# Patient Record
Sex: Female | Born: 1965 | Race: Black or African American | Hispanic: No | Marital: Married | Smoking: Never smoker
Health system: Southern US, Community
[De-identification: ages and names within clinical notes are randomized; demographics above are authoritative.]

## PROBLEM LIST (undated history)

## (undated) DIAGNOSIS — E039 Hypothyroidism, unspecified: Secondary | ICD-10-CM

## (undated) DIAGNOSIS — K649 Unspecified hemorrhoids: Secondary | ICD-10-CM

## (undated) DIAGNOSIS — M1990 Unspecified osteoarthritis, unspecified site: Secondary | ICD-10-CM

## (undated) DIAGNOSIS — K219 Gastro-esophageal reflux disease without esophagitis: Secondary | ICD-10-CM

## (undated) HISTORY — DX: Hypothyroidism, unspecified: E03.9

## (undated) HISTORY — PX: TUBAL LIGATION: SHX77

## (undated) HISTORY — PX: APPENDECTOMY: SHX54

## (undated) HISTORY — DX: Unspecified hemorrhoids: K64.9

## (undated) HISTORY — PX: BREAST REDUCTION SURGERY: SHX8

## (undated) HISTORY — DX: Gastro-esophageal reflux disease without esophagitis: K21.9

---
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
## 1994-10-01 HISTORY — PX: BREAST REDUCTION SURGERY: SHX8

## 1996-10-01 HISTORY — PX: TUBAL LIGATION: SHX77

## 1998-01-09 ENCOUNTER — Ambulatory Visit (HOSPITAL_COMMUNITY): Admission: RE | Admit: 1998-01-09 | Discharge: 1998-01-09 | Payer: Self-pay | Admitting: Family Medicine

## 1998-10-18 ENCOUNTER — Encounter: Payer: Self-pay | Admitting: Emergency Medicine

## 1998-10-18 ENCOUNTER — Emergency Department (HOSPITAL_COMMUNITY): Admission: EM | Admit: 1998-10-18 | Discharge: 1998-10-18 | Payer: Self-pay | Admitting: Emergency Medicine

## 1999-02-15 ENCOUNTER — Ambulatory Visit (HOSPITAL_COMMUNITY): Admission: RE | Admit: 1999-02-15 | Discharge: 1999-02-15 | Payer: Self-pay | Admitting: Family Medicine

## 1999-02-16 ENCOUNTER — Encounter: Payer: Self-pay | Admitting: Family Medicine

## 1999-03-10 ENCOUNTER — Ambulatory Visit (HOSPITAL_COMMUNITY): Admission: RE | Admit: 1999-03-10 | Discharge: 1999-03-10 | Payer: Self-pay | Admitting: Family Medicine

## 1999-03-10 ENCOUNTER — Encounter: Payer: Self-pay | Admitting: Family Medicine

## 1999-06-22 ENCOUNTER — Inpatient Hospital Stay (HOSPITAL_COMMUNITY): Admission: RE | Admit: 1999-06-22 | Discharge: 1999-06-23 | Payer: Self-pay | Admitting: General Surgery

## 1999-06-25 ENCOUNTER — Emergency Department (HOSPITAL_COMMUNITY): Admission: EM | Admit: 1999-06-25 | Discharge: 1999-06-25 | Payer: Self-pay | Admitting: Emergency Medicine

## 1999-09-15 ENCOUNTER — Emergency Department (HOSPITAL_COMMUNITY): Admission: EM | Admit: 1999-09-15 | Discharge: 1999-09-15 | Payer: Self-pay | Admitting: Emergency Medicine

## 1999-09-15 ENCOUNTER — Encounter: Payer: Self-pay | Admitting: Emergency Medicine

## 2001-03-21 ENCOUNTER — Encounter: Payer: Self-pay | Admitting: Family Medicine

## 2001-03-21 ENCOUNTER — Encounter: Admission: RE | Admit: 2001-03-21 | Discharge: 2001-03-21 | Payer: Self-pay | Admitting: Family Medicine

## 2001-04-13 ENCOUNTER — Emergency Department (HOSPITAL_COMMUNITY): Admission: EM | Admit: 2001-04-13 | Discharge: 2001-04-13 | Payer: Self-pay | Admitting: Emergency Medicine

## 2001-04-13 ENCOUNTER — Encounter: Payer: Self-pay | Admitting: Emergency Medicine

## 2001-04-28 ENCOUNTER — Other Ambulatory Visit: Admission: RE | Admit: 2001-04-28 | Discharge: 2001-04-28 | Payer: Self-pay | Admitting: Family Medicine

## 2005-02-06 ENCOUNTER — Other Ambulatory Visit: Admission: RE | Admit: 2005-02-06 | Discharge: 2005-02-06 | Payer: Self-pay | Admitting: Family Medicine

## 2005-10-30 ENCOUNTER — Encounter: Admission: RE | Admit: 2005-10-30 | Discharge: 2005-10-30 | Payer: Self-pay | Admitting: Family Medicine

## 2006-02-07 ENCOUNTER — Other Ambulatory Visit: Admission: RE | Admit: 2006-02-07 | Discharge: 2006-02-07 | Payer: Self-pay | Admitting: Family Medicine

## 2007-02-21 ENCOUNTER — Ambulatory Visit: Payer: Self-pay | Admitting: Family Medicine

## 2007-02-21 LAB — CONVERTED CEMR LAB
Free T4: 1.1 ng/dL (ref 0.6–1.6)
T3, Free: 4.4 pg/mL — ABNORMAL HIGH (ref 2.3–4.2)
TSH: 0.03 microintl units/mL — ABNORMAL LOW (ref 0.35–5.50)

## 2007-05-02 ENCOUNTER — Ambulatory Visit: Payer: Self-pay | Admitting: Family Medicine

## 2007-05-02 DIAGNOSIS — E039 Hypothyroidism, unspecified: Secondary | ICD-10-CM | POA: Insufficient documentation

## 2007-05-02 LAB — CONVERTED CEMR LAB: TSH: 0.891 microintl units/mL (ref 0.350–5.50)

## 2007-06-03 ENCOUNTER — Ambulatory Visit: Payer: Self-pay | Admitting: Family Medicine

## 2010-01-14 ENCOUNTER — Emergency Department (HOSPITAL_COMMUNITY): Admission: EM | Admit: 2010-01-14 | Discharge: 2010-01-14 | Payer: Self-pay | Admitting: Emergency Medicine

## 2010-07-17 ENCOUNTER — Encounter (INDEPENDENT_AMBULATORY_CARE_PROVIDER_SITE_OTHER): Payer: Self-pay | Admitting: *Deleted

## 2010-08-07 ENCOUNTER — Ambulatory Visit: Payer: Self-pay | Admitting: Gastroenterology

## 2010-08-07 DIAGNOSIS — R6889 Other general symptoms and signs: Secondary | ICD-10-CM | POA: Insufficient documentation

## 2010-08-07 DIAGNOSIS — K219 Gastro-esophageal reflux disease without esophagitis: Secondary | ICD-10-CM | POA: Insufficient documentation

## 2010-08-07 DIAGNOSIS — R1310 Dysphagia, unspecified: Secondary | ICD-10-CM | POA: Insufficient documentation

## 2010-08-07 LAB — CONVERTED CEMR LAB
ALT: 11 units/L (ref 0–35)
AST: 21 units/L (ref 0–37)
Albumin: 3.9 g/dL (ref 3.5–5.2)
Alkaline Phosphatase: 50 units/L (ref 39–117)
BUN: 6 mg/dL (ref 6–23)
Basophils Absolute: 0 10*3/uL (ref 0.0–0.1)
Basophils Relative: 0.1 % (ref 0.0–3.0)
CO2: 28 meq/L (ref 19–32)
Calcium: 8.6 mg/dL (ref 8.4–10.5)
Chloride: 105 meq/L (ref 96–112)
Creatinine, Ser: 0.9 mg/dL (ref 0.4–1.2)
Eosinophils Absolute: 0 10*3/uL (ref 0.0–0.7)
Eosinophils Relative: 0.8 % (ref 0.0–5.0)
GFR calc non Af Amer: 82.63 mL/min (ref >60)
Glucose, Bld: 126 mg/dL — ABNORMAL HIGH (ref 70–99)
HCT: 38.7 % (ref 36.0–46.0)
Hemoglobin: 12.8 g/dL (ref 12.0–15.0)
Lymphocytes Relative: 35.6 % (ref 12.0–46.0)
Lymphs Abs: 2.2 10*3/uL (ref 0.7–4.0)
MCHC: 33 g/dL (ref 30.0–36.0)
MCV: 74.1 fL — ABNORMAL LOW (ref 78.0–100.0)
Monocytes Absolute: 0.3 10*3/uL (ref 0.1–1.0)
Monocytes Relative: 5.6 % (ref 3.0–12.0)
Neutro Abs: 3.5 10*3/uL (ref 1.4–7.7)
Neutrophils Relative %: 57.9 % (ref 43.0–77.0)
Platelets: 239 10*3/uL (ref 150.0–400.0)
Potassium: 4.1 meq/L (ref 3.5–5.1)
RBC: 5.22 M/uL — ABNORMAL HIGH (ref 3.87–5.11)
RDW: 18.3 % — ABNORMAL HIGH (ref 11.5–14.6)
Sodium: 142 meq/L (ref 135–145)
Total Bilirubin: 0.9 mg/dL (ref 0.3–1.2)
Total Protein: 6.6 g/dL (ref 6.0–8.3)
WBC: 6.1 10*3/uL (ref 4.5–10.5)

## 2010-09-07 ENCOUNTER — Ambulatory Visit: Payer: Self-pay | Admitting: Gastroenterology

## 2010-10-05 ENCOUNTER — Encounter (INDEPENDENT_AMBULATORY_CARE_PROVIDER_SITE_OTHER): Payer: Self-pay | Admitting: *Deleted

## 2010-10-22 ENCOUNTER — Encounter: Payer: Self-pay | Admitting: Family Medicine

## 2010-11-02 NOTE — Procedures (Signed)
Summary: Upper Endoscopy  Patient: Shelby Espinoza Below Note: All result statuses are Final unless otherwise noted.  Tests: (1) Upper Endoscopy (EGD)   EGD Upper Endoscopy       DONE     [facility]     [address]     [place], [place]  [address]           ENDOSCOPY PROCEDURE REPORT           PATIENT:  Shelby Espinoza, Shelby Espinoza  MR#:  [id number]     BIRTHDATE:  1966/01/04, 44 yrs. old  GENDER:  female           ENDOSCOPIST:  Barbette Hair. Arlyce Dice, MD     Referred by:           PROCEDURE DATE:  09/07/2010     PROCEDURE:  EGD, diagnostic 43235, Maloney Dilation of Esophagus     ASA CLASS:  Class I     INDICATIONS:  dysphagia           MEDICATIONS:   Fentanyl 50 mcg IV, Versed 8 mg IV, glycopyrrolate     (Robinal) 0.2 mg IV, 0.6cc simethancone 0.6 cc PO     TOPICAL ANESTHETIC:  Exactacain Spray           DESCRIPTION OF PROCEDURE:   After the risks benefits and     alternatives of the procedure were thoroughly explained, informed     consent was obtained.  The LB GIF-H180 [id number] endoscope was     introduced through the mouth and advanced to the third portion of     the duodenum, without limitations.  The instrument was slowly     withdrawn as the mucosa was fully examined.     <<PROCEDUREIMAGES>>           The upper, middle, and distal third of the esophagus were     carefully inspected and no abnormalities were noted. The z-line     was well seen at the GEJ. The endoscope was pushed into the fundus     which was normal including a retroflexed view. The antrum,gastric     body, first and second part of the duodenum were unremarkable.     Dilation with maloney dilator 18mm Dysphagis without frank     stricture (see image1 and image2). Minimal resistance; no heme     Retroflexed views revealed no abnormalities.    The scope was then     withdrawn from the patient and the procedure completed.           COMPLICATIONS:  None           ENDOSCOPIC IMPRESSION:     1) Normal EGD - s/p maloney  dilitation     RECOMMENDATIONS:     1) continue current medications     2) Call office next 2-3 days to schedule an office appointment     for 3-4 weeks           REPEAT EXAM:  No           ______________________________     Barbette Hair. Arlyce Dice, MD           CC:  Herb Grays, MD           n.     Rosalie DoctorBarbette Hair. Kaplan at 09/07/2010 02:38 PM           Randa Spike, [phone]  Note: An exclamation mark (!) indicates a result that was  not dispersed into the flowsheet. Document Creation Date: 09/07/2010 2:39 PM _______________________________________________________________________  (1) Order result status: Final Collection or observation date-time: 09/07/2010 14:33 Requested date-time:  Receipt date-time:  Reported date-time:  Referring Physician:   Ordering Physician: Melvia Heaps [phone]) Specimen Source:  Source: Launa Grill Order Number: [id number] Lab site:

## 2010-11-02 NOTE — Assessment & Plan Note (Signed)
Summary: CONSTIPATION AND BAD BREATH...AS.   History of Present Illness Visit Type: Follow-up Visit Primary GI MD: Melvia Heaps MD [facility] Primary Leith Szafranski: Earma Reading, MD  Requesting Dustyn Dansereau: na Chief Complaint: Constipation, bloating, GERD, bad breath, and food gets stuck when patient swallows  History of Present Illness:   Shelby Espinoza is a 45 year old Afro-American female referred at the request of Dr. Collins Scotland for evaluation of dysphagia and halitosis.  She has dysphagia is to solids and may actually feel like she is choking at times.  She denies pyrosis.  She also claims that she developes halitosis after eating almost anything.  She complains of mild bloating and constipation for years.  These symptoms are nonspecific.   Any foods may cause symptoms  although she claims that she is lactose intolerant as well.  PPI therapy in the past has not been helpful.   GI Review of Systems    Reports acid reflux, bloating, and  dysphagia with solids.      Denies abdominal pain, belching, chest pain, dysphagia with liquids, loss of appetite, nausea, vomiting, vomiting blood, weight loss, and  weight gain.      Reports constipation.     Denies anal fissure, black tarry stools, change in bowel habit, diarrhea, diverticulosis, fecal incontinence, heme positive stool, hemorrhoids, irritable bowel syndrome, jaundice, light color stool, liver problems, rectal bleeding, and  rectal pain.    Current Medications (verified): 1)  Cytomel 25 Mcg  Tabs (Liothyronine Sodium) .... 1/2  Tab By Mouth in The Am  Allergies (verified): No Known Drug Allergies  Past History:  Past Medical History: GERD (ICD-530.81) HYPOTHYROIDISM (ICD-244.9)  Past Surgical History: Reviewed history from 06/03/2007 and no changes required. CB x1 Appendectomy Caesarean section Thyroidectomy  Family History: Family History of  AIDS Family History of Alcoholism/Addiction Family History of CAD Female 1st degree relative  <50 Family History Diabetes 1st degree relative Family History High cholesterol Family History Hypertension No FH of Colon Cancer: Family History of Breast Cancer:Mother  Family History of Esophageal Cancer:Father   Social History: Radiation protection practitioner Married 3 Childern Never Smoked Alcohol use-no Drug use-no  Review of Systems       The patient complains of fatigue.  The patient denies allergy/sinus, anemia, anxiety-new, arthritis/joint pain, back pain, blood in urine, breast changes/lumps, change in vision, confusion, cough, coughing up blood, depression-new, fainting, fever, headaches-new, hearing problems, heart murmur, heart rhythm changes, itching, menstrual pain, muscle pains/cramps, night sweats, nosebleeds, pregnancy symptoms, shortness of breath, skin rash, sleeping problems, sore throat, swelling of feet/legs, swollen lymph glands, thirst - excessive , urination - excessive , urination changes/pain, urine leakage, vision changes, and voice change.         All other systems were reviewed and were negative   Vital Signs:  Patient profile:   45 year old female Height:      63 inches Weight:      174 pounds BMI:     30.93 BSA:     1.82 Pulse rate:   78 / minute Pulse rhythm:   regular BP sitting:   136 / 80  (left arm) Cuff size:   regular  Vitals Entered By: Ok Anis CMA (August 07, 2010 3:29 PM)  Physical Exam  Additional Exam:  O physical sent exam she is well-developed alert female  skin: anicteric HEENT: normocephalic; PEERLA; no nasal or pharyngeal abnormalities neck: supple nodes: no cervical lymphadenopathy chest: clear to ausculatation and percussion heart: no murmurs, gallops, or rubs abd: soft,  nontender; BS normoactive; no abdominal masses, tenderness, organomegaly; there is no succussion splash rectal: deferred ext: no cynanosis, clubbing, edema skeletal: no deformities neuro: oriented x 3; no focal abnormalities    Impression  & Recommendations:  Problem # 1:  DYSPHAGIA UNSPECIFIED (ICD-787.20) Plan upper endoscopy to rule out an esophageal stricture.  With her complaints of halitosis a Zenker's diverticulum must also be considered. Orders: EGD (EGD) TLB-CBC Platelet - w/Differential (85025-CBCD) TLB-CMP (Comprehensive Metabolic Pnl) (80053-COMP)  Problem # 2:  OTHER SYMPTOMS INVOLVING HEAD AND NECK (ICD-784.99) Halitosis is a very subjective complaint.  This could be result of mild gastroparesis and mild indigestion or maldigestion.  Mild hypomotility may also be contributing.  Recommendations #1 trial of Amitiza 8 micrograms b.i.d.  Problem # 3:  GERD (ICD-530.81) The absence of response to PPI therapy against GERD  Patient Instructions: 1)  Copy sent to : Earma Reading, MD  2)  Go to basement today for lab work 3)  You have been scheduled for a Upper Endoscopy with possible Dilatation. 4)  Upper Endoscopy with Dilatation brochure given.  5)  Amitiza samples given  6)  The medication list was reviewed and reconciled.  All changed / newly prescribed medications were explained.  A complete medication list was provided to the patient / caregiver.

## 2010-11-02 NOTE — Assessment & Plan Note (Signed)
  Nurse Visit   Preventive Screening-Counseling & Management  Comments: I spoke with Shelby Espinoza on 10/05/2010 regarding the [employer] constipation study. A consent form had been given to her at the time of her procedure. After reading the consent form Shelby Espinoza told me that she was not interested in the study and that while she has occasional constipation, that it is not consistant and probably would not qualify for the study.   Allergies: No Known Drug Allergies

## 2010-11-02 NOTE — Letter (Signed)
Summary: New Patient letter  [facility]  [address] [place], [place] [address]   Phone: [phone]  Fax: [phone]       07/17/2010 MRN: [id number]  [facility] [address] [place], [place]  [address]  Dear Ms. Shelby Espinoza,  Welcome to the Gastroenterology Division at [facility].    You are scheduled to see Dr. Arlyce Dice on 08/07/2010 at 3:30PM on the 3rd floor at [facility], [address]  We ask that you try to arrive at our office 15 minutes prior to your appointment time to allow for check-in.  We would like you to complete the enclosed self-administered evaluation form prior to your visit and bring it with you on the day of your appointment.  We will review it with you.  Also, please bring a complete list of all your medications or, if you prefer, bring the medication bottles and we will list them.  Please bring your insurance card so that we may make a copy of it.  If your insurance requires a referral to see a specialist, please bring your referral form from your primary care physician.  Co-payments are due at the time of your visit and may be paid by cash, check or credit card.     Your office visit will consist of a consult with your physician (includes a physical exam), any laboratory testing he/she may order, scheduling of any necessary diagnostic testing (e.g. x-ray, ultrasound, CT-scan), and scheduling of a procedure (e.g. Endoscopy, Colonoscopy) if required.  Please allow enough time on your schedule to allow for any/all of these possibilities.    If you cannot keep your appointment, please call [phone] to cancel or reschedule prior to your appointment date.  This allows [place] the opportunity to schedule an appointment for another patient in need of care.  If you do not cancel or reschedule by 5 p.m. the business day prior to your appointment date, you will be charged a $50.00 late cancellation/no-show fee.    Thank you for choosing  [facility] for your medical needs.  We appreciate the opportunity to care for you.  Please visit [place] at our website  to learn more about our practice.                     Sincerely,                                                             The Gastroenterology Division

## 2010-11-02 NOTE — Letter (Signed)
Summary: EGD Instructions  [facility]  [address] [place], [place] [address]   Phone: [phone]  Fax: [phone]       Shelby Espinoza    June 02, 1966    MRN: [id number]       Procedure Day /Date:Thursday September 07, 2010     Arrival Time: 1:00 PM     Procedure Time: 2:00 PM     Location of Procedure:                    [facility] (4th Floor)   PREPARATION FOR ENDOSCOPY   On Thursday 09-07-10 THE DAY OF THE PROCEDURE:  1.   No solid foods, milk or milk products are allowed after midnight the night before your procedure.  2.   Do not drink anything colored red or purple.  Avoid juices with pulp.  No orange juice.  3.  You may drink clear liquids until 12pm which is 2 hours before your procedure.                                                                                                CLEAR LIQUIDS INCLUDE: Water Jello Ice Popsicles Tea (sugar ok, no milk/cream) Powdered fruit flavored drinks Coffee (sugar ok, no milk/cream) Gatorade Juice: apple, white grape, white cranberry  Lemonade Clear bullion, consomm, broth Carbonated beverages (any kind) Strained chicken noodle soup Hard Candy   MEDICATION INSTRUCTIONS  Unless otherwise instructed, you should take regular prescription medications with a small sip of water as early as possible the morning of your procedure.         OTHER INSTRUCTIONS  You will need a responsible adult at least 46 years of age to accompany you and drive you home.   This person must remain in the waiting room during your procedure.  Wear loose fitting clothing that is easily removed.  Leave jewelry and other valuables at home.  However, you may wish to bring a book to read or an iPod/MP3 player to listen to music as you wait for your procedure to start.  Remove all body piercing jewelry and leave at home.  Total time from sign-in until discharge is approximately 2-3 hours.  You should go home  directly after your procedure and rest.  You can resume normal activities the day after your procedure.  The day of your procedure you should not:   Drive   Make legal decisions   Operate machinery   Drink alcohol   Return to work  You will receive specific instructions about eating, activities and medications before you leave.    The above instructions have been reviewed and explained to me by   _______________________    I fully understand and can verbalize these instructions _____________________________ Date _________

## 2010-11-02 NOTE — Assessment & Plan Note (Signed)
Summary: NEED THYROID CHECKED   Vital Signs:  Patient Profile:   45 Years Old Female Weight:      168 pounds (76.36 kg) Temp:     98.1 degrees F (36.72 degrees C) oral BP sitting:   119 / 80  (right arm)  Vitals Entered By: Arcola Jansky, RN (June 03, 2007 4:10 PM)                 Chief Complaint:  "THINK THAT THE NEW RX OF THYROID MED HIGH ENOUGH".  History of Present Illness: Miss Scaletta comes in today accompanied by her husband for evaluation of hypothyroidism.  She was being in the spring for a physical right ration at that time.  Her TSH was low at .03.  His Synthroid was decreased to hundred micrograms a day to 75.  She had a follow-up TSH, and it was normal.  However, she says she feels better and wants to go back on the under microgram dose.  Review of systems is negative  Acute Visit History:      She denies abdominal pain, chest pain, constipation, cough, diarrhea, earache, eye symptoms, fever, genitourinary symptoms, headache, musculoskeletal symptoms, nasal discharge, nausea, rash, sinus problems, sore throat, and vomiting.         Current Allergies: No known allergies   Past Medical History:    Reviewed history from 06/03/2007 and no changes required:       Hypothyroidism     Review of Systems      See HPI   Physical Exam  General:     Well-developed,well-nourished,in no acute distress; alert,appropriate and cooperative throughout examination Neck:     No deformities, masses, or tenderness noted.    Impression & Recommendations:  Problem # 1:  HYPOTHYROIDISM (ICD-244.9) Assessment: Deteriorated   Patient Instructions: 1)  had a long discussion with the patient and her husband about hypothyroidism.  We discussed the necessity of keeping her TSH within normal range.  However, she feels bad, and was to go back on her Synthroid under micrograms a day.  Therefore, rewrite her medicine as directed.  Come back in May for annual checkup

## 2010-11-02 NOTE — Letter (Signed)
Summary: Results Letter  [facility]  [address] [place], [place] [address]   Phone: [phone]  Fax: [phone]        August 07, 2010 MRN: [id number]    [facility] [address] [place], [place]  [address]    Dear Ms. DECOSTE,  It is my pleasure to have treated you recently as a new patient in my office. I appreciate your confidence and the opportunity to participate in your care.  Since I do have a busy inpatient endoscopy schedule and office schedule, my office hours vary weekly. I am, however, available for emergency calls everyday through my office. If I am not available for an urgent office appointment, another one of our gastroenterologist will be able to assist you.  My well-trained staff are prepared to help you at all times. For emergencies after office hours, a physician from our Gastroenterology section is always available through my 24 hour answering service  Once again I welcome you as a new patient and I look forward to a happy and healthy relationship             Sincerely,  Louis Meckel MD  This letter has been electronically signed by your physician.  Appended Document: Results Letter letter mailed

## 2011-02-16 NOTE — Assessment & Plan Note (Signed)
[facility]                                 ON-CALL NOTE   TENNILE, STYLES                         MRN:          [id number]  DATE:05/10/2007                            DOB:          1966-03-21    PRIMARY CARE PHYSICIAN:  Dr. Tawanna Cooler   The patient called in wanting to know the results of her labs. She was  advised to call her primary care physician on Monday in order to get the  laboratory information she is seeking.     Leanne Chang, M.D.  Electronically Signed    LA/MedQ  DD: 05/10/2007  DT: 05/11/2007  Job #: [id number]

## 2011-05-29 ENCOUNTER — Telehealth: Payer: Self-pay | Admitting: Gastroenterology

## 2011-05-29 NOTE — Telephone Encounter (Signed)
Never heard of it.  Please ask pt more info about this

## 2011-05-29 NOTE — Telephone Encounter (Signed)
Pt c/o a foul breath odor. She saw a program on TV that talked about a urine test for TMAU. Pt asked her PCP and she was told this would have to be done by her GI doc. Is this a test that you are familiar with or could be done here? Please advise.

## 2011-05-30 NOTE — Telephone Encounter (Signed)
Pt aware.

## 2011-06-06 ENCOUNTER — Telehealth: Payer: Self-pay

## 2011-06-06 NOTE — Telephone Encounter (Signed)
ok ----- Message ----- From: Lily Lovings, RN Sent: 06/06/2011 10:59 AM To: Louis Meckel, MD  Pt is interested in pursuing this test. [facility] and left message for them to call back regarding the Urine TMAU test.   Phone number [phone]   ----- Message ----- From: Louis Meckel, MD Sent: 06/01/2011 10:35 AM To: Lily Lovings, RN  Ask the pt if she wishes [place] to pursue this ----- Message ----- From: Lily Lovings, RN Sent: 06/01/2011 8:30 AM To: Louis Meckel, MD  Spoke with our lab and [facility] regarding the test. According to [facility] the [facility] clinic doesn't do this test anymore. It would have to be performed by either [place] [facility] or [facility]. If we want to look into this more those companies would have to be contacted to see exactly how the specimen was to be sent and the billing. ----- Message ----- From: Louis Meckel, MD Sent: 05/31/2011 4:43 PM To: Lily Lovings, RN  Please check with the lab and see if they do a urine test for TMAU testing. You can tell them that the female clinic we'll perform this test called [id number].         Attached Reports     The sender attached the following reports to this message:        Left message for pt that the test would be billed to the ordering provider and we cannot do that. The company states they do not bill the pt or the insurance company. Let pt know I could get her the phone number if she wanted to pursue this on her own. Dr. Arlyce Dice aware.

## 2011-08-01 ENCOUNTER — Other Ambulatory Visit: Payer: Self-pay | Admitting: Obstetrics and Gynecology

## 2012-08-13 ENCOUNTER — Other Ambulatory Visit: Payer: Self-pay | Admitting: Family Medicine

## 2012-08-13 DIAGNOSIS — N6325 Unspecified lump in the left breast, overlapping quadrants: Secondary | ICD-10-CM

## 2012-08-20 ENCOUNTER — Ambulatory Visit
Admission: RE | Admit: 2012-08-20 | Discharge: 2012-08-20 | Disposition: A | Discharge status: Home / Self Care | Payer: BC Managed Care – PPO | Source: Ambulatory Visit | Attending: Family Medicine | Admitting: Family Medicine

## 2012-08-20 ENCOUNTER — Other Ambulatory Visit: Payer: Self-pay | Admitting: Family Medicine

## 2012-08-20 DIAGNOSIS — N6315 Unspecified lump in the right breast, overlapping quadrants: Secondary | ICD-10-CM

## 2012-08-20 DIAGNOSIS — N6325 Unspecified lump in the left breast, overlapping quadrants: Secondary | ICD-10-CM

## 2013-04-08 ENCOUNTER — Emergency Department (HOSPITAL_COMMUNITY): Payer: BC Managed Care – PPO

## 2013-04-08 ENCOUNTER — Emergency Department (HOSPITAL_COMMUNITY)
Admission: EM | Admit: 2013-04-08 | Discharge: 2013-04-08 | Disposition: A | Discharge status: Home / Self Care | Payer: BC Managed Care – PPO | Attending: Emergency Medicine | Admitting: Emergency Medicine

## 2013-04-08 ENCOUNTER — Encounter (HOSPITAL_COMMUNITY): Payer: Self-pay

## 2013-04-08 DIAGNOSIS — R110 Nausea: Secondary | ICD-10-CM | POA: Insufficient documentation

## 2013-04-08 DIAGNOSIS — R109 Unspecified abdominal pain: Secondary | ICD-10-CM

## 2013-04-08 DIAGNOSIS — Z9851 Tubal ligation status: Secondary | ICD-10-CM | POA: Insufficient documentation

## 2013-04-08 DIAGNOSIS — R42 Dizziness and giddiness: Secondary | ICD-10-CM | POA: Insufficient documentation

## 2013-04-08 DIAGNOSIS — Z9889 Other specified postprocedural states: Secondary | ICD-10-CM | POA: Insufficient documentation

## 2013-04-08 DIAGNOSIS — R5383 Other fatigue: Secondary | ICD-10-CM | POA: Insufficient documentation

## 2013-04-08 DIAGNOSIS — Z79899 Other long term (current) drug therapy: Secondary | ICD-10-CM | POA: Insufficient documentation

## 2013-04-08 DIAGNOSIS — R5381 Other malaise: Secondary | ICD-10-CM | POA: Insufficient documentation

## 2013-04-08 DIAGNOSIS — Z9089 Acquired absence of other organs: Secondary | ICD-10-CM | POA: Insufficient documentation

## 2013-04-08 DIAGNOSIS — Z3202 Encounter for pregnancy test, result negative: Secondary | ICD-10-CM | POA: Insufficient documentation

## 2013-04-08 LAB — URINALYSIS, ROUTINE W REFLEX MICROSCOPIC
Bilirubin Urine: NEGATIVE
Glucose, UA: NEGATIVE mg/dL
Hgb urine dipstick: NEGATIVE
Ketones, ur: NEGATIVE mg/dL
Leukocytes, UA: NEGATIVE
Nitrite: NEGATIVE
Protein, ur: NEGATIVE mg/dL
Specific Gravity, Urine: 1.009 (ref 1.005–1.030)
Urobilinogen, UA: 0.2 mg/dL (ref 0.0–1.0)
pH: 5.5 (ref 5.0–8.0)

## 2013-04-08 LAB — CBC WITH DIFFERENTIAL/PLATELET
Basophils Absolute: 0 10*3/uL (ref 0.0–0.1)
Basophils Relative: 0 % (ref 0–1)
Eosinophils Absolute: 0 10*3/uL (ref 0.0–0.7)
Eosinophils Relative: 0 % (ref 0–5)
HCT: 36.3 % (ref 36.0–46.0)
Hemoglobin: 12.4 g/dL (ref 12.0–15.0)
Lymphocytes Relative: 20 % (ref 12–46)
Lymphs Abs: 2 10*3/uL (ref 0.7–4.0)
MCH: 23.5 pg — ABNORMAL LOW (ref 26.0–34.0)
MCHC: 34.2 g/dL (ref 30.0–36.0)
MCV: 68.9 fL — ABNORMAL LOW (ref 78.0–100.0)
Monocytes Absolute: 0.5 10*3/uL (ref 0.1–1.0)
Monocytes Relative: 5 % (ref 3–12)
Neutro Abs: 7.5 10*3/uL (ref 1.7–7.7)
Neutrophils Relative %: 75 % (ref 43–77)
Platelets: 223 10*3/uL (ref 150–400)
RBC: 5.27 MIL/uL — ABNORMAL HIGH (ref 3.87–5.11)
RDW: 17.4 % — ABNORMAL HIGH (ref 11.5–15.5)
WBC: 10 10*3/uL (ref 4.0–10.5)

## 2013-04-08 LAB — LIPASE, BLOOD: Lipase: 31 U/L (ref 11–59)

## 2013-04-08 LAB — COMPREHENSIVE METABOLIC PANEL
ALT: 10 U/L (ref 0–35)
AST: 13 U/L (ref 0–37)
Albumin: 3.6 g/dL (ref 3.5–5.2)
Alkaline Phosphatase: 55 U/L (ref 39–117)
BUN: 7 mg/dL (ref 6–23)
CO2: 29 mEq/L (ref 19–32)
Calcium: 8.7 mg/dL (ref 8.4–10.5)
Chloride: 103 mEq/L (ref 96–112)
Creatinine, Ser: 0.83 mg/dL (ref 0.50–1.10)
GFR calc Af Amer: >90 mL/min (ref >90)
GFR calc non Af Amer: 82 mL/min — ABNORMAL LOW (ref >90)
Glucose, Bld: 133 mg/dL — ABNORMAL HIGH (ref 70–99)
Potassium: 3.9 mEq/L (ref 3.5–5.1)
Sodium: 138 mEq/L (ref 135–145)
Total Bilirubin: 1.4 mg/dL — ABNORMAL HIGH (ref 0.3–1.2)
Total Protein: 7.3 g/dL (ref 6.0–8.3)

## 2013-04-08 LAB — POCT PREGNANCY, URINE: Preg Test, Ur: NEGATIVE

## 2013-04-08 MED ORDER — OMEPRAZOLE 20 MG PO CPDR: 20.0000 mg | DELAYED_RELEASE_CAPSULE | Freq: Every day | ORAL | Status: DC

## 2013-04-08 MED ORDER — ONDANSETRON HCL 4 MG/2ML IJ SOLN
4.0000 mg | Freq: Once | INTRAMUSCULAR | Status: AC
  Administered 2013-04-08: 4 mg via INTRAVENOUS
  Filled 2013-04-08: qty 2

## 2013-04-08 MED ORDER — MORPHINE SULFATE 4 MG/ML IJ SOLN
4.0000 mg | Freq: Once | INTRAMUSCULAR | Status: AC
  Administered 2013-04-08: 4 mg via INTRAVENOUS
  Filled 2013-04-08: qty 1

## 2013-04-08 NOTE — ED Notes (Signed)
Pt c/o generalized abd pain, nausea, and decrease appetite x3 days, pt denies V/D. PCP sent pt to ED for further evaluation

## 2013-04-08 NOTE — ED Notes (Signed)
US paged

## 2013-04-08 NOTE — ED Notes (Signed)
Pt states she has had abdominal pain throughout abdomen. Pt denies n/v/d. Pt denies fever. Pt denies change in vision. Pt denies chest pain. Pt states she has been dizzy and lightheaded. Pt alert and mentating appropriately. Increased pt with palpation of abdomen.

## 2013-04-08 NOTE — ED Provider Notes (Signed)
History    CSN: [id number] Arrival date & time 04/08/13  1501  First MD Initiated Contact with Patient 04/08/13 1725     Chief Complaint  Patient presents with  . Abdominal Pain    Patient is a 47 y.o. female presenting with abdominal pain. The history is provided by the patient.  Abdominal Pain This is a new problem. The current episode started more than 2 days ago. Associated symptoms include abdominal pain. Pertinent negatives include no chest pain and no shortness of breath. Exacerbated by: palpation. Nothing relieves the symptoms. She has tried rest for the symptoms. The treatment provided no relief.  pt reports onset of constant pain in epigastric and RUQ for past 3 days The pain does not radiate to back No cp/sob.   She has never had this before  History reviewed. No pertinent past medical history. Past Surgical History  Procedure Laterality Date  . Cesarean section    . Appendectomy    . Breast reduction surgery    . Tubal ligation     History reviewed. No pertinent family history. History  Substance Use Topics  . Smoking status: Never Smoker   . Smokeless tobacco: Not on file  . Alcohol Use: No   OB History   Grav Para Term Preterm Abortions TAB SAB Ect Mult Living                 Review of Systems  Constitutional: Positive for fatigue.  Respiratory: Negative for shortness of breath.   Cardiovascular: Negative for chest pain.  Gastrointestinal: Positive for nausea and abdominal pain. Negative for vomiting and diarrhea.  Neurological: Positive for dizziness. Negative for weakness.  All other systems reviewed and are negative.    Allergies  Review of patient's allergies indicates no known allergies.  Home Medications   Current Outpatient Rx  Name  Route  Sig  Dispense  Refill  . levothyroxine (SYNTHROID, LEVOTHROID) 25 MCG tablet   Oral   Take 25 mcg by mouth daily before breakfast.          BP 118/78  Pulse 84  Temp(Src) 98.4 F (36.9 C)  (Oral)  Resp 20  SpO2 100%  LMP 03/27/2013 BP 114/74  Pulse 70  Temp(Src) 98.6 F (37 C) (Oral)  Resp 16  SpO2 100%  LMP 03/27/2013  Physical Exam CONSTITUTIONAL: Well developed/well nourished HEAD: Normocephalic/atraumatic EYES: EOMI/PERRL, no icterus noted ENMT: Mucous membranes moist NECK: supple no meningeal signs SPINE:entire spine nontender CV: S1/S2 noted, no murmurs/rubs/gallops noted LUNGS: Lungs are clear to auscultation bilaterally, no apparent distress ABDOMEN: soft, epigastric tenderness and moderate RUQ tenderness noted, no rebound or guarding GU:no cva tenderness NEURO: Pt is awake/alert, moves all extremitiesx4 EXTREMITIES: pulses normal, full ROM SKIN: warm, color normal PSYCH: no abnormalities of mood noted  ED Course  Procedures  Labs Reviewed  CBC WITH DIFFERENTIAL - Abnormal; Notable for the following:    RBC 5.27 (*)    MCV 68.9 (*)    MCH 23.5 (*)    RDW 17.4 (*)    All other components within normal limits  COMPREHENSIVE METABOLIC PANEL - Abnormal; Notable for the following:    Glucose, Bld 133 (*)    Total Bilirubin 1.4 (*)    GFR calc non Af Amer 82 (*)    All other components within normal limits  LIPASE, BLOOD  URINALYSIS, ROUTINE W REFLEX MICROSCOPIC  POCT PREGNANCY, URINE   7:52 PM Pt with focal RUQ tenderness, US imaging ordered Pt clinically  stable at this time 9:34 PM Pt improved Abdomen soft.  Mild tenderness noted to epigastric region but no other tenderness Workup thus far unremarkable I doubt acute abdominal process at this time.   We discussed strict return precautions Also, she requests something "for an ulcer" But she denies h/o GI bleed/rectal bleeding or NSAID use  MDM  Nursing notes including past medical history and social history reviewed and considered in documentation Labs/vital reviewed and considered    Date: 04/08/2013 1912  Rate: 78  Rhythm: normal sinus rhythm  QRS Axis: normal  Intervals: normal   ST/T Wave abnormalities: nonspecific ST changes  Conduction Disutrbances:none        Joya Gaskins, MD 04/08/13 2135

## 2013-04-08 NOTE — ED Notes (Signed)
Patient transported to Ultrasound 

## 2013-04-10 ENCOUNTER — Other Ambulatory Visit (HOSPITAL_COMMUNITY): Payer: Self-pay | Admitting: Family Medicine

## 2013-04-10 DIAGNOSIS — R109 Unspecified abdominal pain: Secondary | ICD-10-CM

## 2013-04-21 ENCOUNTER — Encounter (HOSPITAL_COMMUNITY): Payer: BC Managed Care – PPO

## 2013-12-28 ENCOUNTER — Telehealth: Payer: Self-pay | Admitting: Family Medicine

## 2013-12-28 NOTE — Telephone Encounter (Signed)
Per Dr Sherren Mocha,  He is no longer taking new or re-established patients.  He suggests Dr Maudie Mercury.

## 2013-12-28 NOTE — Telephone Encounter (Signed)
Pt would like to re-est with md. Pt last seen several yrs ago. Dr todd sees her husband

## 2014-01-04 NOTE — Telephone Encounter (Signed)
Pt is aware.  

## 2014-04-16 ENCOUNTER — Telehealth: Payer: Self-pay | Admitting: Gastroenterology

## 2014-04-16 NOTE — Telephone Encounter (Signed)
Rec'd from [facility] forward 4 pages to Dr. Deatra Ina

## 2014-10-06 IMAGING — US US ABDOMEN COMPLETE
1 series · 14 of 25 positions shown · non-contrast
Comparison: None

CLINICAL DATA: Right upper quadrant pain

COMPLETE ABDOMINAL ULTRASOUND:

[Series 1: us abdomen complete · 0.25mm/px · 14 of 85 slices shown]
[im 1/85]
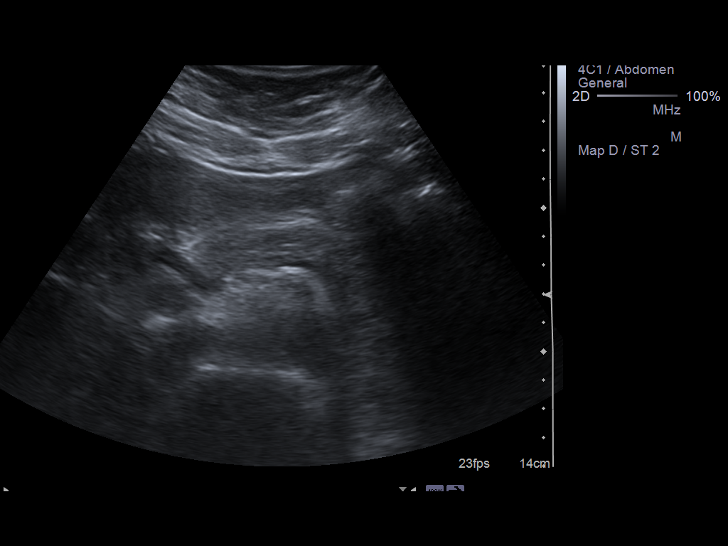
[im 8/85]
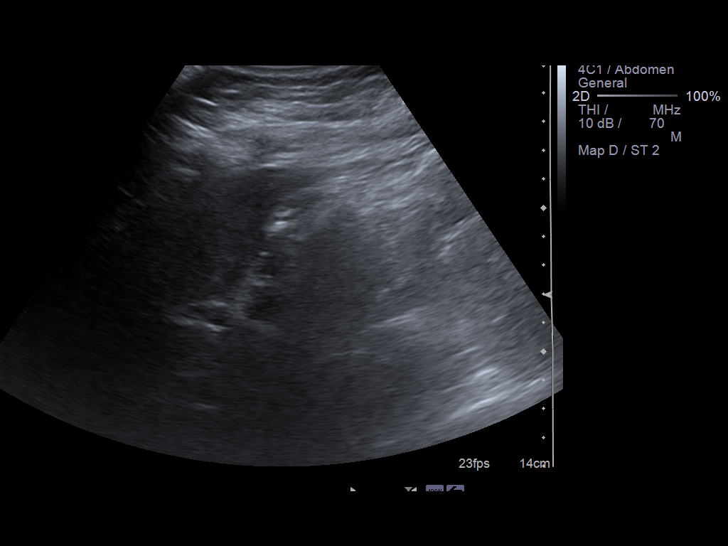
[im 15/85]
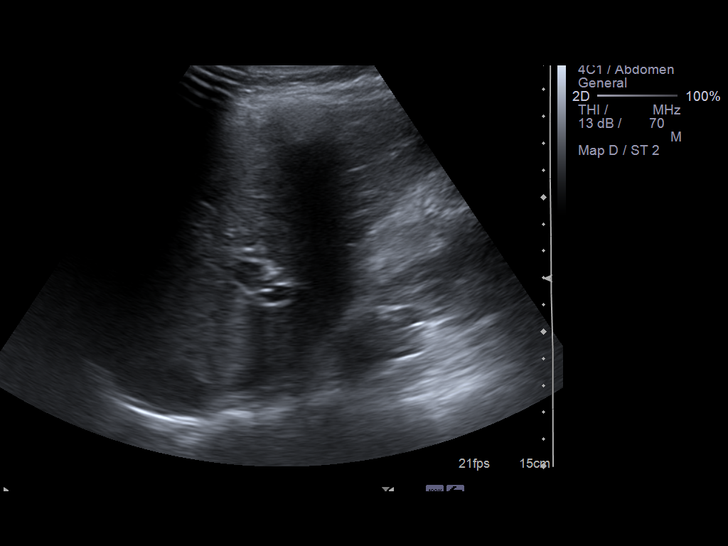
[im 22/85]
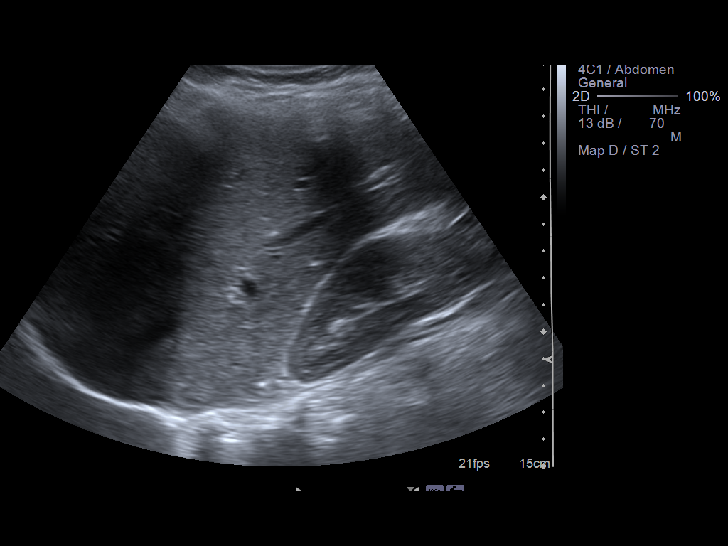
[im 29/85]
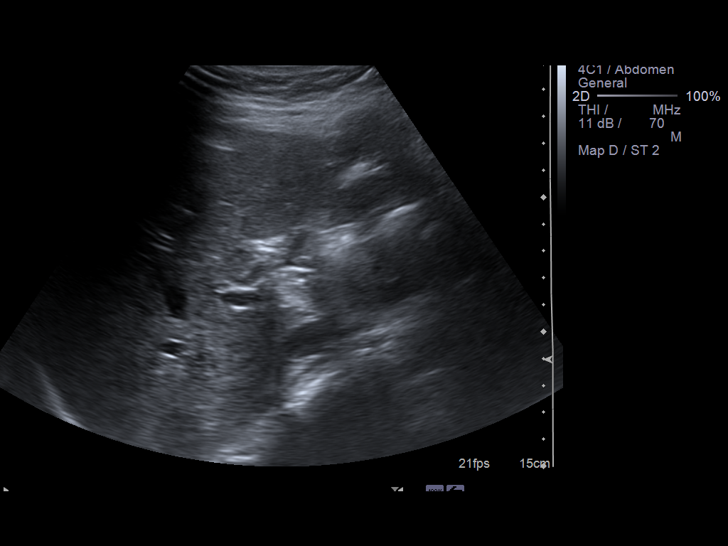
[im 32/85]
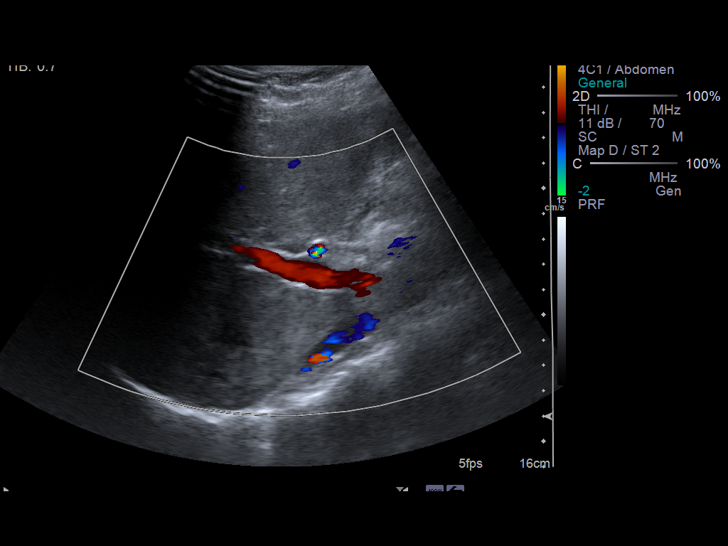
[im 39/85]
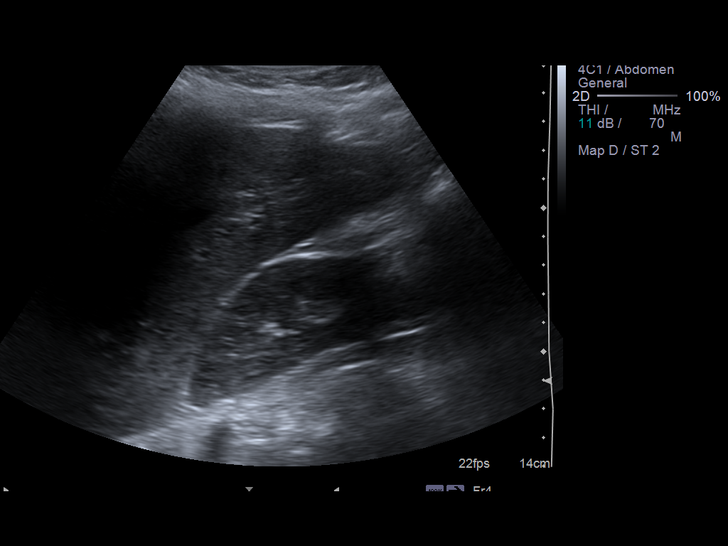
[im 46/85]
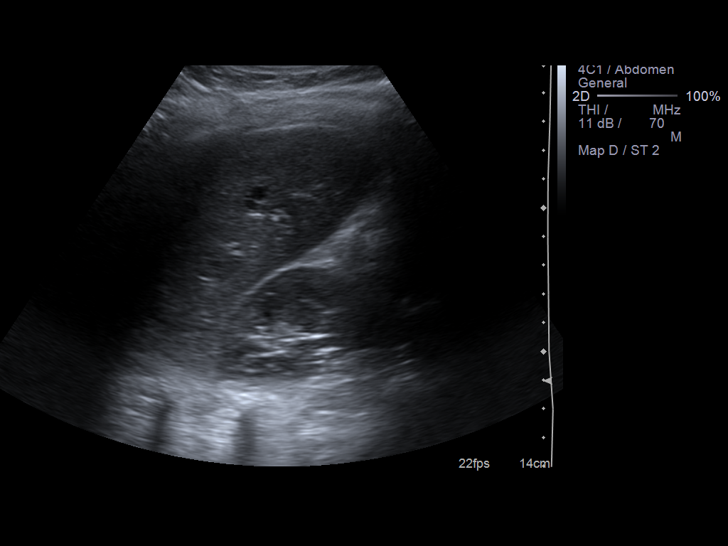
[im 53/85]
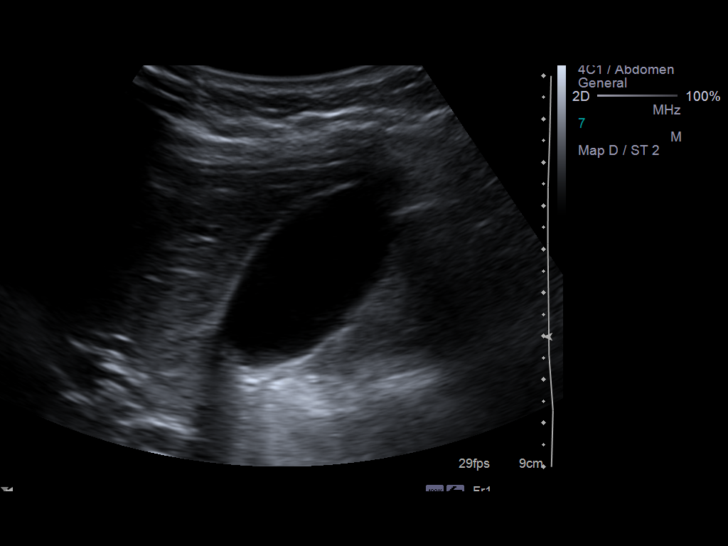
[im 57/85]
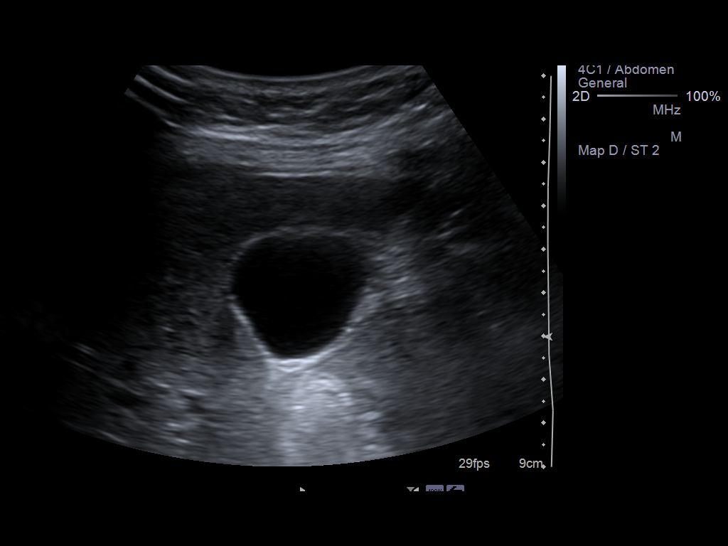
[im 64/85]
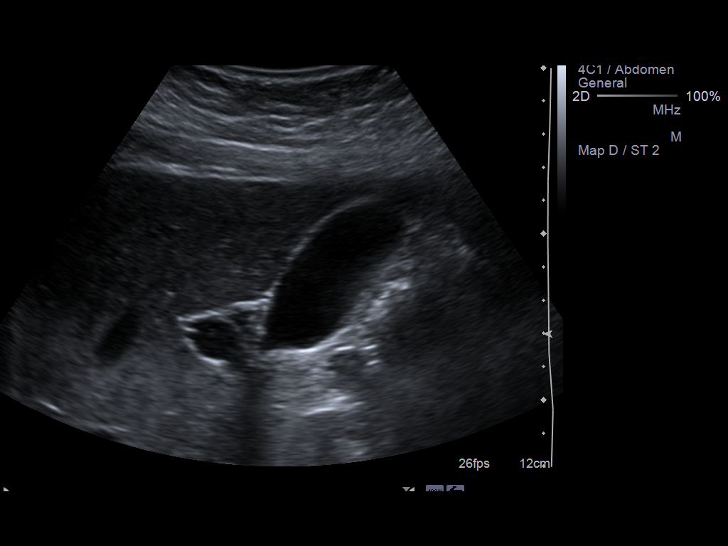
[im 71/85]
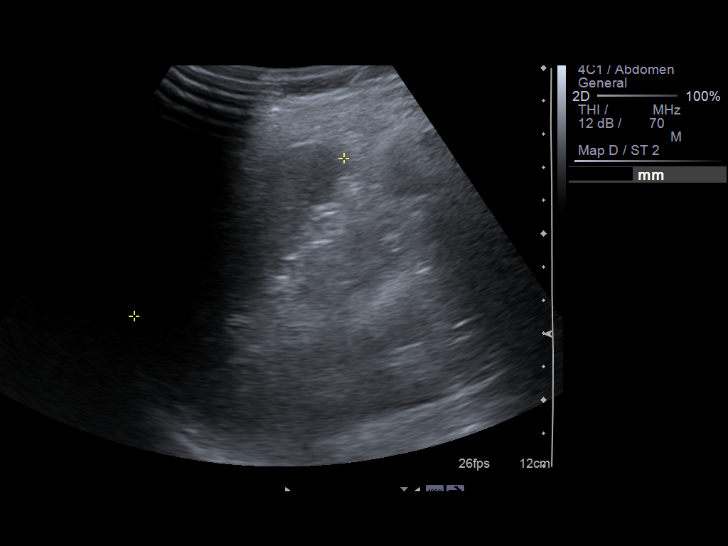
[im 78/85]
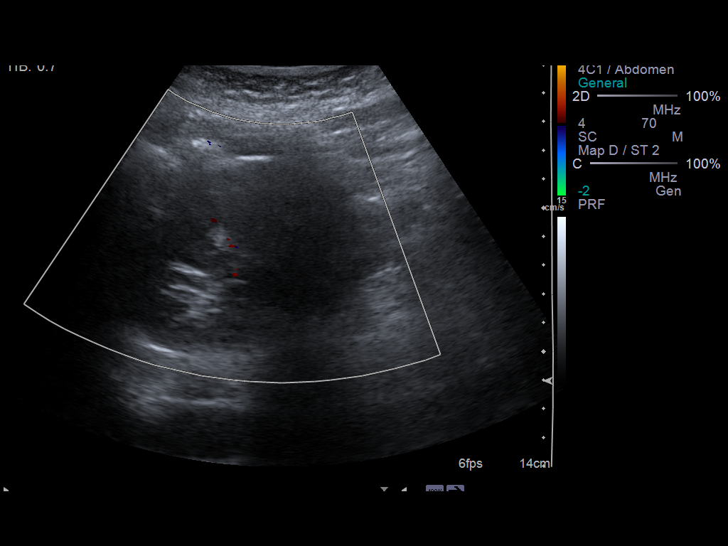
[im 85/85]
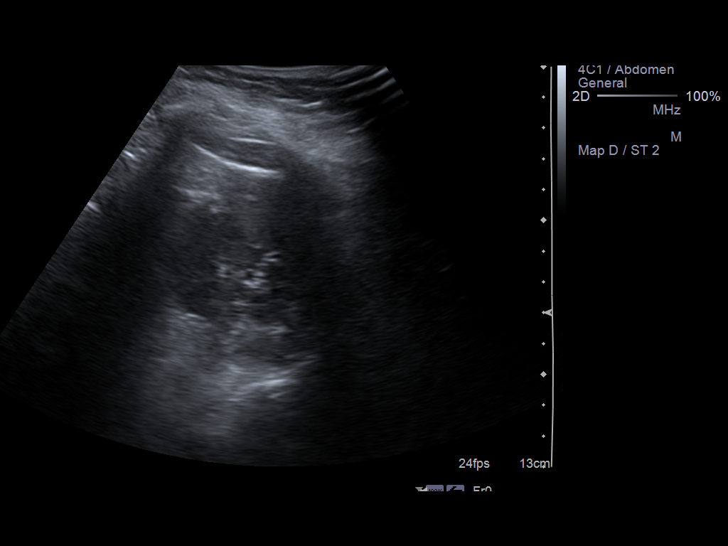

[14 of 25 positions shown; findings below may reference images not displayed]

Gallbladder:      Normally distended without stones or wall
            thickening
            No pericholecystic fluid or sonographic Murphy sign

Common bile duct:  4 mm diameter, normal

Liver:  Normal appearance

IVC:  Unremarkable

Pancreas:  Normal appearance

Spleen:  Normal appearance, 7.9 cm length

Right Kidney:  Normal size and morphology, 9.3 cm length

Left Kidney:  Normal size and morphology, 10.2 cm length

Abdominal aorta:  Unremarkable

No ascites
IMPRESSION: Normal upper abdominal ultrasound.

## 2015-06-27 ENCOUNTER — Other Ambulatory Visit: Payer: Self-pay

## 2015-06-29 LAB — CYTOLOGY - PAP

## 2016-01-26 DIAGNOSIS — Z803 Family history of malignant neoplasm of breast: Secondary | ICD-10-CM | POA: Diagnosis not present

## 2016-01-26 DIAGNOSIS — Z1231 Encounter for screening mammogram for malignant neoplasm of breast: Secondary | ICD-10-CM | POA: Diagnosis not present

## 2016-04-12 DIAGNOSIS — Z23 Encounter for immunization: Secondary | ICD-10-CM | POA: Diagnosis not present

## 2016-04-20 ENCOUNTER — Telehealth: Payer: Self-pay | Admitting: Adult Health

## 2016-04-20 ENCOUNTER — Encounter: Payer: Self-pay | Admitting: Adult Health

## 2016-04-20 ENCOUNTER — Ambulatory Visit (INDEPENDENT_AMBULATORY_CARE_PROVIDER_SITE_OTHER): Payer: BLUE CROSS/BLUE SHIELD | Admitting: Adult Health

## 2016-04-20 VITALS — BP 108/74 | Temp 98.3°F | Ht 63.0 in | Wt 182.3 lb

## 2016-04-20 DIAGNOSIS — Z1211 Encounter for screening for malignant neoplasm of colon: Secondary | ICD-10-CM

## 2016-04-20 DIAGNOSIS — E039 Hypothyroidism, unspecified: Secondary | ICD-10-CM

## 2016-04-20 DIAGNOSIS — Z0000 Encounter for general adult medical examination without abnormal findings: Secondary | ICD-10-CM

## 2016-04-20 LAB — TSH: TSH: 1.57 u[IU]/mL (ref 0.35–4.50)

## 2016-04-20 MED ORDER — LEVOTHYROXINE SODIUM 25 MCG PO TABS: 25.0000 ug | ORAL_TABLET | Freq: Every day | ORAL | Status: DC

## 2016-04-20 NOTE — Progress Notes (Signed)
Patient presents to clinic today to establish care. She is a pleasant 50 year old female who  has a past medical history of Hypothyroidism and GERD (gastroesophageal reflux disease).  Her last physical was a year ago.   Acute Concerns: Establish Care  Chronic Issues: Hypothyroidism  - She is currently on 22mcg of synthroid. She has not had her levels checked in " a year" and is about out of medication  Health Maintenance: Dental -- Twice a year  Vision -- Yearly  Immunizations --UTD Colonoscopy -- Never had  Mammogram -- May 2017  PAP --  Unknown, has GYN  Bone Density -- Never had  Diet: She eats healthy  Exercise: she walks every night.    Past Medical History  Diagnosis Date  . Hypothyroidism   . GERD (gastroesophageal reflux disease)     Past Surgical History  Procedure Laterality Date  . Cesarean section    . Appendectomy    . Breast reduction surgery    . Tubal ligation      Current Outpatient Prescriptions on File Prior to Visit  Medication Sig Dispense Refill  . levothyroxine (SYNTHROID, LEVOTHROID) 25 MCG tablet Take 25 mcg by mouth daily before breakfast.    . omeprazole (PRILOSEC) 20 MG capsule Take 1 capsule (20 mg total) by mouth daily. (Patient not taking: Reported on 04/20/2016) 30 capsule 0   No current facility-administered medications on file prior to visit.    No Known Allergies  No family history on file.  Social History   Social History  . Marital Status: Married    Spouse Name: N/A  . Number of Children: N/A  . Years of Education: N/A   Occupational History  . Not on file.   Social History Main Topics  . Smoking status: Never Smoker   . Smokeless tobacco: Not on file  . Alcohol Use: No  . Drug Use: No  . Sexual Activity: Yes    Birth Control/ Protection: None   Other Topics Concern  . Not on file   Social History Narrative    Review of Systems  Constitutional: Negative.   Eyes: Negative.   Respiratory: Negative.    Cardiovascular: Negative.   Gastrointestinal: Negative.   Genitourinary: Negative.   Musculoskeletal: Negative.   Skin: Negative.   Neurological: Negative.   Psychiatric/Behavioral: Negative.   All other systems reviewed and are negative.   BP 108/74 mmHg  Temp(Src) 98.3 F (36.8 C) (Oral)  Ht 5\' 3"  (1.6 m)  Wt 182 lb 4.8 oz (82.691 kg)  BMI 32.30 kg/m2  Physical Exam  Constitutional: She is oriented to person, place, and time and well-developed, well-nourished, and in no distress. No distress.  obese  HENT:  Head: Normocephalic and atraumatic.  Right Ear: External ear normal.  Left Ear: External ear normal.  Nose: Nose normal.  Mouth/Throat: Oropharynx is clear and moist. No oropharyngeal exudate.  Eyes: Conjunctivae and EOM are normal. Pupils are equal, round, and reactive to light. Right eye exhibits no discharge. Left eye exhibits no discharge.  Neck: Normal range of motion. Neck supple. No thyromegaly present.  Cardiovascular: Normal rate, regular rhythm, normal heart sounds and intact distal pulses.  Exam reveals no gallop and no friction rub.   No murmur heard. Pulmonary/Chest: Effort normal and breath sounds normal. No respiratory distress. She has no wheezes. She has no rales. She exhibits no tenderness.  Abdominal: Soft. Bowel sounds are normal. She exhibits no distension and no mass. There is no  tenderness. There is no rebound and no guarding.  Musculoskeletal: Normal range of motion. She exhibits no edema.  Lymphadenopathy:    She has no cervical adenopathy.  Neurological: She is alert and oriented to person, place, and time. She has normal reflexes. Gait normal. GCS score is 15.  Skin: Skin is warm and dry. No rash noted. She is not diaphoretic. No erythema. No pallor.  Psychiatric: Mood, memory, affect and judgment normal.  Nursing note and vitals reviewed.   No results found for this or any previous visit (from the past 2160 hour(s)).  Assessment/Plan: 1.  Routine general medical examination at a health care facility - Follow up for CPE - Follow up sooner if needed  2. Colon cancer screening - Ambulatory referral to Gastroenterology  3. Hypothyroidism, unspecified hypothyroidism type - TSH - Consider changing dose   Dorothyann Peng, NP

## 2016-04-20 NOTE — Patient Instructions (Addendum)
It was great meeting you today!  I will follow up with you regarding your blood work   Please follow up this fall for your complete physical   Someone will call you to schedule your colonoscopy   Health Maintenance, Female Adopting a healthy lifestyle and getting preventive care can go a long way to promote health and wellness. Talk with your health care provider about what schedule of regular examinations is right for you. This is a good chance for you to check in with your provider about disease prevention and staying healthy. In between checkups, there are plenty of things you can do on your own. Experts have done a lot of research about which lifestyle changes and preventive measures are most likely to keep you healthy. Ask your health care provider for more information. WEIGHT AND DIET  Eat a healthy diet  Be sure to include plenty of vegetables, fruits, low-fat dairy products, and lean protein.  Do not eat a lot of foods high in solid fats, added sugars, or salt.  Get regular exercise. This is one of the most important things you can do for your health.  Most adults should exercise for at least 150 minutes each week. The exercise should increase your heart rate and make you sweat (moderate-intensity exercise).  Most adults should also do strengthening exercises at least twice a week. This is in addition to the moderate-intensity exercise.  Maintain a healthy weight  Body mass index (BMI) is a measurement that can be used to identify possible weight problems. It estimates body fat based on height and weight. Your health care provider can help determine your BMI and help you achieve or maintain a healthy weight.  For females 18 years of age and older:   A BMI below 18.5 is considered underweight.  A BMI of 18.5 to 24.9 is normal.  A BMI of 25 to 29.9 is considered overweight.  A BMI of 30 and above is considered obese.  Watch levels of cholesterol and blood lipids  You  should start having your blood tested for lipids and cholesterol at 51 years of age, then have this test every 5 years.  You may need to have your cholesterol levels checked more often if:  Your lipid or cholesterol levels are high.  You are older than 51 years of age.  You are at high risk for heart disease.  CANCER SCREENING   Lung Cancer  Lung cancer screening is recommended for adults 18-28 years old who are at high risk for lung cancer because of a history of smoking.  A yearly low-dose CT scan of the lungs is recommended for people who:  Currently smoke.  Have quit within the past 15 years.  Have at least a 30-pack-year history of smoking. A pack year is smoking an average of one pack of cigarettes a day for 1 year.  Yearly screening should continue until it has been 15 years since you quit.  Yearly screening should stop if you develop a health problem that would prevent you from having lung cancer treatment.  Breast Cancer  Practice breast self-awareness. This means understanding how your breasts normally appear and feel.  It also means doing regular breast self-exams. Let your health care provider know about any changes, no matter how small.  If you are in your 20s or 30s, you should have a clinical breast exam (CBE) by a health care provider every 1-3 years as part of a regular health exam.  If you  are 56 or older, have a CBE every year. Also consider having a breast X-ray (mammogram) every year.  If you have a family history of breast cancer, talk to your health care provider about genetic screening.  If you are at high risk for breast cancer, talk to your health care provider about having an MRI and a mammogram every year.  Breast cancer gene (BRCA) assessment is recommended for women who have family members with BRCA-related cancers. BRCA-related cancers include:  Breast.  Ovarian.  Tubal.  Peritoneal cancers.  Results of the assessment will determine  the need for genetic counseling and BRCA1 and BRCA2 testing. Cervical Cancer Your health care provider may recommend that you be screened regularly for cancer of the pelvic organs (ovaries, uterus, and vagina). This screening involves a pelvic examination, including checking for microscopic changes to the surface of your cervix (Pap test). You may be encouraged to have this screening done every 3 years, beginning at age 60.  For women ages 29-65, health care providers may recommend pelvic exams and Pap testing every 3 years, or they may recommend the Pap and pelvic exam, combined with testing for human papilloma virus (HPV), every 5 years. Some types of HPV increase your risk of cervical cancer. Testing for HPV may also be done on women of any age with unclear Pap test results.  Other health care providers may not recommend any screening for nonpregnant women who are considered low risk for pelvic cancer and who do not have symptoms. Ask your health care provider if a screening pelvic exam is right for you.  If you have had past treatment for cervical cancer or a condition that could lead to cancer, you need Pap tests and screening for cancer for at least 20 years after your treatment. If Pap tests have been discontinued, your risk factors (such as having a new sexual partner) need to be reassessed to determine if screening should resume. Some women have medical problems that increase the chance of getting cervical cancer. In these cases, your health care provider may recommend more frequent screening and Pap tests. Colorectal Cancer  This type of cancer can be detected and often prevented.  Routine colorectal cancer screening usually begins at 51 years of age and continues through 51 years of age.  Your health care provider may recommend screening at an earlier age if you have risk factors for colon cancer.  Your health care provider may also recommend using home test kits to check for hidden blood  in the stool.  A small camera at the end of a tube can be used to examine your colon directly (sigmoidoscopy or colonoscopy). This is done to check for the earliest forms of colorectal cancer.  Routine screening usually begins at age 83.  Direct examination of the colon should be repeated every 5-10 years through 51 years of age. However, you may need to be screened more often if early forms of precancerous polyps or small growths are found. Skin Cancer  Check your skin from head to toe regularly.  Tell your health care provider about any new moles or changes in moles, especially if there is a change in a mole's shape or color.  Also tell your health care provider if you have a mole that is larger than the size of a pencil eraser.  Always use sunscreen. Apply sunscreen liberally and repeatedly throughout the day.  Protect yourself by wearing long sleeves, pants, a wide-brimmed hat, and sunglasses whenever you are outside.  HEART DISEASE, DIABETES, AND HIGH BLOOD PRESSURE   High blood pressure causes heart disease and increases the risk of stroke. High blood pressure is more likely to develop in:  People who have blood pressure in the high end of the normal range (130-139/85-89 mm Hg).  People who are overweight or obese.  People who are African American.  If you are 18-39 years of age, have your blood pressure checked every 3-5 years. If you are 40 years of age or older, have your blood pressure checked every year. You should have your blood pressure measured twice--once when you are at a hospital or clinic, and once when you are not at a hospital or clinic. Record the average of the two measurements. To check your blood pressure when you are not at a hospital or clinic, you can use:  An automated blood pressure machine at a pharmacy.  A home blood pressure monitor.  If you are between 55 years and 79 years old, ask your health care provider if you should take aspirin to prevent  strokes.  Have regular diabetes screenings. This involves taking a blood sample to check your fasting blood sugar level.  If you are at a normal weight and have a low risk for diabetes, have this test once every three years after 51 years of age.  If you are overweight and have a high risk for diabetes, consider being tested at a younger age or more often. PREVENTING INFECTION  Hepatitis B  If you have a higher risk for hepatitis B, you should be screened for this virus. You are considered at high risk for hepatitis B if:  You were born in a country where hepatitis B is common. Ask your health care provider which countries are considered high risk.  Your parents were born in a high-risk country, and you have not been immunized against hepatitis B (hepatitis B vaccine).  You have HIV or AIDS.  You use needles to inject street drugs.  You live with someone who has hepatitis B.  You have had sex with someone who has hepatitis B.  You get hemodialysis treatment.  You take certain medicines for conditions, including cancer, organ transplantation, and autoimmune conditions. Hepatitis C  Blood testing is recommended for:  Everyone born from 1945 through 1965.  Anyone with known risk factors for hepatitis C. Sexually transmitted infections (STIs)  You should be screened for sexually transmitted infections (STIs) including gonorrhea and chlamydia if:  You are sexually active and are younger than 51 years of age.  You are older than 51 years of age and your health care provider tells you that you are at risk for this type of infection.  Your sexual activity has changed since you were last screened and you are at an increased risk for chlamydia or gonorrhea. Ask your health care provider if you are at risk.  If you do not have HIV, but are at risk, it may be recommended that you take a prescription medicine daily to prevent HIV infection. This is called pre-exposure prophylaxis  (PrEP). You are considered at risk if:  You are sexually active and do not regularly use condoms or know the HIV status of your partner(s).  You take drugs by injection.  You are sexually active with a partner who has HIV. Talk with your health care provider about whether you are at high risk of being infected with HIV. If you choose to begin PrEP, you should first be tested for HIV. You   should then be tested every 3 months for as long as you are taking PrEP.  PREGNANCY   If you are premenopausal and you may become pregnant, ask your health care provider about preconception counseling.  If you may become pregnant, take 400 to 800 micrograms (mcg) of folic acid every day.  If you want to prevent pregnancy, talk to your health care provider about birth control (contraception). OSTEOPOROSIS AND MENOPAUSE   Osteoporosis is a disease in which the bones lose minerals and strength with aging. This can result in serious bone fractures. Your risk for osteoporosis can be identified using a bone density scan.  If you are 65 years of age or older, or if you are at risk for osteoporosis and fractures, ask your health care provider if you should be screened.  Ask your health care provider whether you should take a calcium or vitamin D supplement to lower your risk for osteoporosis.  Menopause may have certain physical symptoms and risks.  Hormone replacement therapy may reduce some of these symptoms and risks. Talk to your health care provider about whether hormone replacement therapy is right for you.  HOME CARE INSTRUCTIONS   Schedule regular health, dental, and eye exams.  Stay current with your immunizations.   Do not use any tobacco products including cigarettes, chewing tobacco, or electronic cigarettes.  If you are pregnant, do not drink alcohol.  If you are breastfeeding, limit how much and how often you drink alcohol.  Limit alcohol intake to no more than 1 drink per day for  nonpregnant women. One drink equals 12 ounces of beer, 5 ounces of wine, or 1 ounces of hard liquor.  Do not use street drugs.  Do not share needles.  Ask your health care provider for help if you need support or information about quitting drugs.  Tell your health care provider if you often feel depressed.  Tell your health care provider if you have ever been abused or do not feel safe at home.   This information is not intended to replace advice given to you by your health care provider. Make sure you discuss any questions you have with your health care provider.   Document Released: 04/02/2011 Document Revised: 10/08/2014 Document Reviewed: 08/19/2013 Elsevier Interactive Patient Education 2016 [employer]  

## 2016-04-20 NOTE — Telephone Encounter (Signed)
Spoke to patient on the phone and informed her that her TSH was normal.   Will send in 90 day prescription for Synthroid 84mcg

## 2016-04-23 ENCOUNTER — Telehealth: Payer: Self-pay | Admitting: Adult Health

## 2016-04-23 NOTE — Telephone Encounter (Signed)
Pt needs new rx levothyroxine 50 mcg not 25 mcg #90 w/refills [facility]

## 2016-04-24 NOTE — Telephone Encounter (Signed)
Ok to send in Levothyroxine 50mg ? It looks like you prescribed 25mg  on 04/20/16.

## 2016-04-26 ENCOUNTER — Other Ambulatory Visit: Payer: Self-pay | Admitting: Adult Health

## 2016-04-26 MED ORDER — LEVOTHYROXINE SODIUM 50 MCG PO TABS: 50.0000 ug | ORAL_TABLET | Freq: Every day | ORAL | 3 refills | Status: DC

## 2016-04-26 NOTE — Telephone Encounter (Signed)
Levothyroxine 75mcg tablets were sent in on 04/20/16 - this request is for 29mcg tablets.  Ok to refill?

## 2016-04-26 NOTE — Telephone Encounter (Signed)
Prescription sent in  

## 2016-04-27 NOTE — Telephone Encounter (Signed)
Ok to refill 

## 2016-04-27 NOTE — Telephone Encounter (Signed)
Ok to refill for one month  

## 2016-05-10 ENCOUNTER — Encounter (HOSPITAL_COMMUNITY): Payer: Self-pay | Admitting: Emergency Medicine

## 2016-05-10 ENCOUNTER — Ambulatory Visit (HOSPITAL_COMMUNITY)
Admission: EM | Admit: 2016-05-10 | Discharge: 2016-05-10 | Disposition: A | Discharge status: Home / Self Care | Payer: BLUE CROSS/BLUE SHIELD | Attending: Family Medicine | Admitting: Family Medicine

## 2016-05-10 DIAGNOSIS — J40 Bronchitis, not specified as acute or chronic: Secondary | ICD-10-CM | POA: Diagnosis not present

## 2016-05-10 MED ORDER — GUAIFENESIN-CODEINE 100-10 MG/5ML PO SYRP: 5.0000 mL | ORAL_SOLUTION | Freq: Three times a day (TID) | ORAL | 0 refills | Status: DC | PRN

## 2016-05-10 MED ORDER — ALBUTEROL SULFATE (2.5 MG/3ML) 0.083% IN NEBU
2.5000 mg | INHALATION_SOLUTION | Freq: Once | RESPIRATORY_TRACT | Status: AC
  Administered 2016-05-10: 2.5 mg via RESPIRATORY_TRACT

## 2016-05-10 MED ORDER — ALBUTEROL SULFATE (2.5 MG/3ML) 0.083% IN NEBU
INHALATION_SOLUTION | RESPIRATORY_TRACT | Status: AC
  Filled 2016-05-10: qty 3

## 2016-05-10 MED ORDER — PREDNISONE 10 MG PO TABS: ORAL_TABLET | ORAL | 0 refills | Status: DC

## 2016-05-10 MED ORDER — LEVOFLOXACIN 500 MG PO TABS: 500.0000 mg | ORAL_TABLET | Freq: Every day | ORAL | 0 refills | Status: DC

## 2016-05-10 MED ORDER — METHYLPREDNISOLONE ACETATE 80 MG/ML IJ SUSP
INTRAMUSCULAR | Status: AC
  Filled 2016-05-10: qty 1

## 2016-05-10 MED ORDER — METHYLPREDNISOLONE ACETATE 80 MG/ML IJ SUSP
80.0000 mg | Freq: Once | INTRAMUSCULAR | Status: AC
  Administered 2016-05-10: 80 mg via INTRAMUSCULAR

## 2016-05-10 NOTE — ED Triage Notes (Signed)
Pt c/o cold sx onset x5 days associated w/dry cough and HA... Reports she just got back from [place] and was placed on doxycycline x15 days prior to travel... A&O x4... NAD

## 2016-05-10 NOTE — ED Provider Notes (Signed)
CSN: [id number]     Arrival date & time 05/10/16  2000 History   None    Chief Complaint  Patient presents with  . Shortness of Breath   (Consider location/radiation/quality/duration/timing/severity/associated sxs/prior Treatment) Patient has been having URI sx's for over a week and she has persistent cough which is worse at night.   The history is provided by the patient.  Shortness of Breath  Severity:  Severe Onset quality:  Gradual Duration:  7 days Timing:  Constant Progression:  Worsening Chronicity:  New Relieved by:  Nothing Worsened by:  Nothing Ineffective treatments:  None tried   Past Medical History:  Diagnosis Date  . GERD (gastroesophageal reflux disease)   . Hypothyroidism    Past Surgical History:  Procedure Laterality Date  . APPENDECTOMY    . BREAST REDUCTION SURGERY    . CESAREAN SECTION    . TUBAL LIGATION     Family History  Problem Relation Age of Onset  . Alcohol abuse Father   . Throat cancer Father   . Heart disease Father   . Hypertension Father   . Breast cancer Mother   . Breast cancer Maternal Aunt   . Heart disease Paternal Aunt    Social History  Substance Use Topics  . Smoking status: Never Smoker  . Smokeless tobacco: Never Used  . Alcohol use No   OB History    No data available     Review of Systems  Constitutional: Negative.   HENT: Negative.   Eyes: Negative.   Respiratory: Positive for shortness of breath.   Cardiovascular: Negative.   Gastrointestinal: Negative.   Endocrine: Negative.   Genitourinary: Negative.   Musculoskeletal: Negative.   Skin: Negative.   Allergic/Immunologic: Negative.   Neurological: Negative.   Hematological: Negative.   Psychiatric/Behavioral: Negative.     Allergies  Review of patient's allergies indicates no known allergies.  Home Medications   Prior to Admission medications   Medication Sig Start Date End Date Taking? Authorizing Provider  levothyroxine (SYNTHROID,  LEVOTHROID) 50 MCG tablet Take 1 tablet (50 mcg total) by mouth daily before breakfast. 04/26/16  Yes Dorothyann Peng, NP  liothyronine (CYTOMEL) 25 MCG tablet TAKE 1/2 TABLET BY MOUTH EVERY DAY 04/27/16  Yes Dorothyann Peng, NP  guaiFENesin-codeine (CHERATUSSIN AC) 100-10 MG/5ML syrup Take 5 mLs by mouth 3 (three) times daily as needed for cough. 05/10/16   Lysbeth Penner, FNP  levofloxacin (LEVAQUIN) 500 MG tablet Take 1 tablet (500 mg total) by mouth daily. 05/10/16   Lysbeth Penner, FNP  omeprazole (PRILOSEC) 20 MG capsule Take 1 capsule (20 mg total) by mouth daily. Patient not taking: Reported on 04/20/2016 04/08/13   Ripley Fraise, MD  predniSONE (DELTASONE) 10 MG tablet Take 4 po qd x 2days then 3 po qd x 2days then 2po qd x 2 days then 1 po qd x 2 days then stop 05/10/16   Lysbeth Penner, FNP   Meds Ordered and Administered this Visit   Medications  albuterol (PROVENTIL) (2.5 MG/3ML) 0.083% nebulizer solution 2.5 mg (2.5 mg Nebulization Given 05/10/16 2037)  methylPREDNISolone acetate (DEPO-MEDROL) injection 80 mg (80 mg Intramuscular Given 05/10/16 2037)    Pulse 90   Temp 99.1 F (37.3 C) (Oral)   Resp 16   SpO2 100%  No data found.   Physical Exam  Constitutional: She appears well-developed and well-nourished.  HENT:  Head: Normocephalic and atraumatic.  Right Ear: External ear normal.  Left Ear: External ear normal.  Mouth/Throat: Oropharynx is clear and moist.  Eyes: Conjunctivae and EOM are normal. Pupils are equal, round, and reactive to light.  Neck: Normal range of motion. Neck supple.  Cardiovascular: Normal rate and regular rhythm.   Pulmonary/Chest: Effort normal. She has wheezes.  Abdominal: Soft. Bowel sounds are normal.  Nursing note and vitals reviewed.   Urgent Care Course   Clinical Course    Procedures (including critical care time)  Labs Review Labs Reviewed - No data to display  Imaging Review No results found.   Visual Acuity  Review  Right Eye Distance:   Left Eye Distance:   Bilateral Distance:    Right Eye Near:   Left Eye Near:    Bilateral Near:         MDM   1. Bronchitis    Nebulizer tx albuterol 2.5/42ml now Levaquin 500mg  one po qd x 7 days #7 Solumedrol 125mg  IM Prednisone 10 mg 4po qd x 2d then 3po qd x 2d then 2 po qd x 2d then 1 po qd x 2d then 1 po qd x 2d then stop #20 Cheratussin 68ml po tid prn cough #153ml  Push po fluids, rest, tylenol and motrin otc prn as directed for fever, arthralgias, and myalgias.  Follow up prn if sx's continue or persist.    Lysbeth Penner, FNP 05/10/16 2113

## 2016-05-21 ENCOUNTER — Encounter: Payer: Self-pay | Admitting: Adult Health

## 2016-06-05 ENCOUNTER — Encounter: Payer: Self-pay | Admitting: Adult Health

## 2016-06-05 ENCOUNTER — Ambulatory Visit (INDEPENDENT_AMBULATORY_CARE_PROVIDER_SITE_OTHER): Payer: BLUE CROSS/BLUE SHIELD | Admitting: Adult Health

## 2016-06-05 VITALS — BP 120/70 | Ht 63.0 in | Wt 176.8 lb

## 2016-06-05 DIAGNOSIS — R110 Nausea: Secondary | ICD-10-CM

## 2016-06-05 DIAGNOSIS — G8929 Other chronic pain: Secondary | ICD-10-CM | POA: Diagnosis not present

## 2016-06-05 DIAGNOSIS — R1013 Epigastric pain: Secondary | ICD-10-CM | POA: Diagnosis not present

## 2016-06-05 MED ORDER — ONDANSETRON HCL 4 MG PO TABS: 4.0000 mg | ORAL_TABLET | Freq: Three times a day (TID) | ORAL | 0 refills | Status: DC | PRN

## 2016-06-05 MED ORDER — PANTOPRAZOLE SODIUM 40 MG PO TBEC: 40.0000 mg | DELAYED_RELEASE_TABLET | Freq: Every day | ORAL | 3 refills | Status: DC

## 2016-06-05 NOTE — Patient Instructions (Addendum)
It was great seeing you again  I have sent in some Zofran for nausea and Protonix to help with the acid in your stomach.   Please follow up with [facility]   Address: [address], [place], [place] [address] Hours: Closed now  Phone: [phone]   Peptic Ulcer A peptic ulcer is a sore in the lining of your esophagus (esophageal ulcer), stomach (gastric ulcer), or in the first part of your small intestine (duodenal ulcer). The ulcer causes erosion into the deeper tissue. CAUSES  Normally, the lining of the stomach and the small intestine protects itself from the acid that digests food. The protective lining can be damaged by:  An infection caused by a bacterium called Helicobacter pylori (H. pylori).  Regular use of nonsteroidal anti-inflammatory drugs (NSAIDs), such as ibuprofen or aspirin.  Smoking tobacco. Other risk factors include being older than 55, drinking alcohol excessively, and having a family history of ulcer disease.  SYMPTOMS   Burning pain or gnawing in the area between the chest and the belly button.  Heartburn.  Nausea and vomiting.  Bloating. The pain can be worse on an empty stomach and at night. If the ulcer results in bleeding, it can cause:  Black, tarry stools.  Vomiting of bright red blood.  Vomiting of coffee-ground-looking materials. DIAGNOSIS  A diagnosis is usually made based upon your history and an exam. Other tests and procedures may be performed to find the cause of the ulcer. Finding a cause will help determine the best treatment. Tests and procedures may include:  Blood tests, stool tests, or breath tests to check for the bacterium H. pylori.  An upper gastrointestinal (GI) series of the esophagus, stomach, and small intestine.  An endoscopy to examine the esophagus, stomach, and small intestine.  A biopsy. TREATMENT  Treatment may include:  Eliminating the cause of the ulcer, such as smoking, NSAIDs, or alcohol.  Medicines to  reduce the amount of acid in your digestive tract.  Antibiotic medicines if the ulcer is caused by the H. pylori bacterium.  An upper endoscopy to treat a bleeding ulcer.  Surgery if the bleeding is severe or if the ulcer created a hole somewhere in the digestive system. HOME CARE INSTRUCTIONS   Avoid tobacco, alcohol, and caffeine. Smoking can increase the acid in the stomach, and continued smoking will impair the healing of ulcers.  Avoid foods and drinks that seem to cause discomfort or aggravate your ulcer.  Only take medicines as directed by your caregiver. Do not substitute over-the-counter medicines for prescription medicines without talking to your caregiver.  Keep any follow-up appointments and tests as directed. SEEK MEDICAL CARE IF:   Your do not improve within 7 days of starting treatment.  You have ongoing indigestion or heartburn. SEEK IMMEDIATE MEDICAL CARE IF:   You have sudden, sharp, or persistent abdominal pain.  You have bloody or dark black, tarry stools.  You vomit blood or vomit that looks like coffee grounds.  You become light-headed, weak, or feel faint.  You become sweaty or clammy. MAKE SURE YOU:   Understand these instructions.  Will watch your condition.  Will get help right away if you are not doing well or get worse.   This information is not intended to replace advice given to you by your health care provider. Make sure you discuss any questions you have with your health care provider.   Document Released: 09/14/2000 Document Revised: 10/08/2014 Document Reviewed: 04/16/2012 Elsevier Interactive Patient Education  2016 [employer].

## 2016-06-05 NOTE — Progress Notes (Signed)
   Subjective:    Patient ID: Shelby Espinoza, female    DOB: Feb 06, 1966, 50 y.o.   MRN: [id number]  HPI  50 year old female who presents to the office today for continued nausea. I last saw her in July 2017 and she was complaining of this. I had asked her to restart her prilosec and start a probiotic. Neither of which she has done. She does endorse waking up in the morning and having a sour taste in her mouth. She also reports having episodes of epigastric pain. She feels as though the pain is worse before she eats, after she eats certain foods ( like crackers) the pain and nausea goes away for a short time.   She denies any vomiting or feeling badly. She has not had any constipation or diarrhea.    Review of Systems  Constitutional: Positive for activity change and appetite change. Negative for chills, diaphoresis, fatigue, fever and unexpected weight change.  HENT: Negative.   Respiratory: Negative.   Cardiovascular: Negative.   Gastrointestinal: Positive for abdominal pain and nausea. Negative for abdominal distention, blood in stool, constipation, diarrhea and rectal pain.  Genitourinary: Negative.   Musculoskeletal: Negative.   Psychiatric/Behavioral: Negative.   All other systems reviewed and are negative.      Objective:   Physical Exam  Constitutional: She is oriented to person, place, and time. She appears well-developed and well-nourished. No distress.  Cardiovascular: Normal rate, regular rhythm, normal heart sounds and intact distal pulses.  Exam reveals no gallop and no friction rub.   No murmur heard. Pulmonary/Chest: Effort normal and breath sounds normal. No respiratory distress. She has no wheezes. She has no rales. She exhibits no tenderness.  Abdominal: Soft. Bowel sounds are normal. She exhibits no distension and no mass. There is tenderness in the right upper quadrant and epigastric area. There is no rigidity, no rebound, no guarding and negative Murphy's sign.    Neurological: She is alert and oriented to person, place, and time.  Skin: Skin is warm and dry. No rash noted. She is not diaphoretic. No erythema. No pallor.  Psychiatric: She has a normal mood and affect. Her behavior is normal. Judgment and thought content normal.  Nursing note and vitals reviewed.     Assessment & Plan:  1. Abdominal pain, chronic, epigastric  - ondansetron (ZOFRAN) 4 MG tablet; Take 1 tablet (4 mg total) by mouth every 8 (eight) hours as needed for nausea or vomiting.  Dispense: 20 tablet; Refill: 0 - pantoprazole (PROTONIX) 40 MG tablet; Take 1 tablet (40 mg total) by mouth daily.  Dispense: 30 tablet; Refill: 3 - CBC with Differential/Platelet - Comprehensive metabolic panel - H. pylori antibody, IgG - Lipase - Follow up if no improvement  - Consider referral to GI  2. Nausea without vomiting  - ondansetron (ZOFRAN) 4 MG tablet; Take 1 tablet (4 mg total) by mouth every 8 (eight) hours as needed for nausea or vomiting.  Dispense: 20 tablet; Refill: 0 - pantoprazole (PROTONIX) 40 MG tablet; Take 1 tablet (40 mg total) by mouth daily.  Dispense: 30 tablet; Refill: 3 - Follow up if no improvement   Dorothyann Peng, NP

## 2016-06-06 LAB — COMPREHENSIVE METABOLIC PANEL
ALT: 19 U/L (ref 0–35)
AST: 18 U/L (ref 0–37)
Albumin: 4.1 g/dL (ref 3.5–5.2)
Alkaline Phosphatase: 54 U/L (ref 39–117)
BUN: 10 mg/dL (ref 6–23)
CO2: 29 mEq/L (ref 19–32)
Calcium: 8.7 mg/dL (ref 8.4–10.5)
Chloride: 104 mEq/L (ref 96–112)
Creatinine, Ser: 0.98 mg/dL (ref 0.40–1.20)
GFR: 76.85 mL/min (ref >60.00)
Glucose, Bld: 101 mg/dL — ABNORMAL HIGH (ref 70–99)
Potassium: 4.4 mEq/L (ref 3.5–5.1)
Sodium: 138 mEq/L (ref 135–145)
Total Bilirubin: 0.7 mg/dL (ref 0.2–1.2)
Total Protein: 7.1 g/dL (ref 6.0–8.3)

## 2016-06-06 LAB — CBC WITH DIFFERENTIAL/PLATELET
Basophils Absolute: 0 10*3/uL (ref 0.0–0.1)
Basophils Relative: 0.4 % (ref 0.0–3.0)
Eosinophils Absolute: 0.2 10*3/uL (ref 0.0–0.7)
Eosinophils Relative: 2.3 % (ref 0.0–5.0)
HCT: 38.8 % (ref 36.0–46.0)
Hemoglobin: 12.6 g/dL (ref 12.0–15.0)
Lymphocytes Relative: 31.5 % (ref 12.0–46.0)
Lymphs Abs: 2.1 10*3/uL (ref 0.7–4.0)
MCHC: 32.4 g/dL (ref 30.0–36.0)
MCV: 72.3 fl — ABNORMAL LOW (ref 78.0–100.0)
Monocytes Absolute: 0.3 10*3/uL (ref 0.1–1.0)
Monocytes Relative: 4.2 % (ref 3.0–12.0)
Neutro Abs: 4.2 10*3/uL (ref 1.4–7.7)
Neutrophils Relative %: 61.6 % (ref 43.0–77.0)
Platelets: 238 10*3/uL (ref 150.0–400.0)
RBC: 5.37 Mil/uL — ABNORMAL HIGH (ref 3.87–5.11)
RDW: 19.6 % — ABNORMAL HIGH (ref 11.5–15.5)
WBC: 6.8 10*3/uL (ref 4.0–10.5)

## 2016-06-06 LAB — H. PYLORI ANTIBODY, IGG: H Pylori IgG: NEGATIVE

## 2016-06-06 LAB — LIPASE: Lipase: 51 U/L (ref 11.0–59.0)

## 2016-06-07 ENCOUNTER — Encounter: Payer: Self-pay | Admitting: Gastroenterology

## 2016-07-12 DIAGNOSIS — Z6834 Body mass index (BMI) 34.0-34.9, adult: Secondary | ICD-10-CM | POA: Diagnosis not present

## 2016-07-12 DIAGNOSIS — N907 Vulvar cyst: Secondary | ICD-10-CM | POA: Diagnosis not present

## 2016-07-18 ENCOUNTER — Ambulatory Visit: Payer: BLUE CROSS/BLUE SHIELD | Admitting: *Deleted

## 2016-07-18 VITALS — Ht 63.0 in | Wt 180.2 lb

## 2016-07-18 DIAGNOSIS — Z1211 Encounter for screening for malignant neoplasm of colon: Secondary | ICD-10-CM

## 2016-07-18 MED ORDER — NA SULFATE-K SULFATE-MG SULF 17.5-3.13-1.6 GM/177ML PO SOLN
1.0000 | Freq: Once | ORAL | 0 refills | Status: AC
Start: 2016-07-18 — End: 2016-07-18

## 2016-07-18 NOTE — Progress Notes (Signed)
extra

## 2016-07-19 ENCOUNTER — Other Ambulatory Visit: Payer: BLUE CROSS/BLUE SHIELD

## 2016-07-19 ENCOUNTER — Encounter: Payer: Self-pay | Admitting: Gastroenterology

## 2016-07-23 ENCOUNTER — Other Ambulatory Visit: Payer: Self-pay | Admitting: Adult Health

## 2016-07-23 ENCOUNTER — Other Ambulatory Visit (INDEPENDENT_AMBULATORY_CARE_PROVIDER_SITE_OTHER): Payer: BLUE CROSS/BLUE SHIELD

## 2016-07-23 DIAGNOSIS — Z0000 Encounter for general adult medical examination without abnormal findings: Secondary | ICD-10-CM

## 2016-07-23 LAB — CBC WITH DIFFERENTIAL/PLATELET
Basophils Absolute: 0 10*3/uL (ref 0.0–0.1)
Basophils Relative: 0.3 % (ref 0.0–3.0)
Eosinophils Absolute: 0.1 10*3/uL (ref 0.0–0.7)
Eosinophils Relative: 1.1 % (ref 0.0–5.0)
HCT: 39.5 % (ref 36.0–46.0)
Hemoglobin: 12.8 g/dL (ref 12.0–15.0)
Lymphocytes Relative: 39.1 % (ref 12.0–46.0)
Lymphs Abs: 2 10*3/uL (ref 0.7–4.0)
MCHC: 32.4 g/dL (ref 30.0–36.0)
MCV: 72.7 fl — ABNORMAL LOW (ref 78.0–100.0)
Monocytes Absolute: 0.4 10*3/uL (ref 0.1–1.0)
Monocytes Relative: 7.4 % (ref 3.0–12.0)
Neutro Abs: 2.7 10*3/uL (ref 1.4–7.7)
Neutrophils Relative %: 52.1 % (ref 43.0–77.0)
Platelets: 236 10*3/uL (ref 150.0–400.0)
RBC: 5.44 Mil/uL — ABNORMAL HIGH (ref 3.87–5.11)
RDW: 19.7 % — ABNORMAL HIGH (ref 11.5–15.5)
WBC: 5.2 10*3/uL (ref 4.0–10.5)

## 2016-07-23 LAB — POC URINALSYSI DIPSTICK (AUTOMATED)
Bilirubin, UA: NEGATIVE
Blood, UA: NEGATIVE
Glucose, UA: NEGATIVE
Ketones, UA: NEGATIVE
Leukocytes, UA: NEGATIVE
Nitrite, UA: NEGATIVE
Protein, UA: NEGATIVE
Spec Grav, UA: 1.015
Urobilinogen, UA: 1
pH, UA: 6.5

## 2016-07-23 LAB — BASIC METABOLIC PANEL
BUN: 10 mg/dL (ref 6–23)
CO2: 31 mEq/L (ref 19–32)
Calcium: 9.2 mg/dL (ref 8.4–10.5)
Chloride: 105 mEq/L (ref 96–112)
Creatinine, Ser: 0.96 mg/dL (ref 0.40–1.20)
GFR: 78.66 mL/min (ref >60.00)
Glucose, Bld: 98 mg/dL (ref 70–99)
Potassium: 4.2 mEq/L (ref 3.5–5.1)
Sodium: 141 mEq/L (ref 135–145)

## 2016-07-23 LAB — TSH: TSH: 1.05 u[IU]/mL (ref 0.35–4.50)

## 2016-07-23 LAB — HEPATIC FUNCTION PANEL
ALT: 11 U/L (ref 0–35)
AST: 16 U/L (ref 0–37)
Albumin: 4.1 g/dL (ref 3.5–5.2)
Alkaline Phosphatase: 49 U/L (ref 39–117)
Bilirubin, Direct: 0.2 mg/dL (ref 0.0–0.3)
Total Bilirubin: 1.3 mg/dL — ABNORMAL HIGH (ref 0.2–1.2)
Total Protein: 6.6 g/dL (ref 6.0–8.3)

## 2016-07-23 LAB — LIPID PANEL
Cholesterol: 141 mg/dL (ref 0–200)
HDL: 38.9 mg/dL — ABNORMAL LOW (ref >39.00)
LDL Cholesterol: 90 mg/dL (ref 0–99)
NonHDL: 102.4
Total CHOL/HDL Ratio: 4
Triglycerides: 63 mg/dL (ref 0.0–149.0)
VLDL: 12.6 mg/dL (ref 0.0–40.0)

## 2016-07-23 NOTE — Telephone Encounter (Signed)
Ok to refill for one year  

## 2016-07-23 NOTE — Telephone Encounter (Signed)
Ok to refill 

## 2016-07-25 ENCOUNTER — Telehealth: Payer: Self-pay | Admitting: Gastroenterology

## 2016-07-25 NOTE — Telephone Encounter (Signed)
Spoke with patient. She was ok with paying $50 for Suprep not the $80. So we will give her pharmacy the "pay no more than $50" coupon to use.

## 2016-07-26 ENCOUNTER — Encounter: Payer: BLUE CROSS/BLUE SHIELD | Admitting: Adult Health

## 2016-07-26 ENCOUNTER — Telehealth: Payer: Self-pay | Admitting: *Deleted

## 2016-07-26 DIAGNOSIS — Z0289 Encounter for other administrative examinations: Secondary | ICD-10-CM

## 2016-07-26 NOTE — Telephone Encounter (Signed)
I called [facility] and gave the pharmacist the numbers on the voucher for $50.00.  Per the pharmacist , the copay is $50.00.  I called the patient to advise they have her Suprep at [facility] ready to pick up. She thanked me for letting her know.

## 2016-07-27 ENCOUNTER — Telehealth: Payer: Self-pay

## 2016-07-27 NOTE — Telephone Encounter (Signed)
$  50 coupon already faxed to pharmacy.  Patient to pick up.

## 2016-07-27 NOTE — Telephone Encounter (Signed)
-----   Message from Levonne Spiller, RN sent at 07/25/2016  3:07 PM EDT ----- Pharmacy needs $50 coupon please. Thank you!

## 2016-08-01 ENCOUNTER — Ambulatory Visit (AMBULATORY_SURGERY_CENTER): Payer: BLUE CROSS/BLUE SHIELD | Admitting: Gastroenterology

## 2016-08-01 ENCOUNTER — Encounter: Payer: Self-pay | Admitting: Adult Health

## 2016-08-01 ENCOUNTER — Ambulatory Visit (INDEPENDENT_AMBULATORY_CARE_PROVIDER_SITE_OTHER): Payer: BLUE CROSS/BLUE SHIELD | Admitting: Adult Health

## 2016-08-01 ENCOUNTER — Encounter: Payer: Self-pay | Admitting: Gastroenterology

## 2016-08-01 VITALS — BP 120/72 | Temp 98.6°F | Ht 63.0 in | Wt 173.1 lb

## 2016-08-01 VITALS — BP 116/86 | HR 70 | Temp 97.7°F | Resp 10 | Ht 63.0 in | Wt 180.0 lb

## 2016-08-01 DIAGNOSIS — Z1211 Encounter for screening for malignant neoplasm of colon: Secondary | ICD-10-CM

## 2016-08-01 DIAGNOSIS — Z1212 Encounter for screening for malignant neoplasm of rectum: Secondary | ICD-10-CM | POA: Diagnosis not present

## 2016-08-01 DIAGNOSIS — Z0000 Encounter for general adult medical examination without abnormal findings: Secondary | ICD-10-CM | POA: Diagnosis not present

## 2016-08-01 DIAGNOSIS — K219 Gastro-esophageal reflux disease without esophagitis: Secondary | ICD-10-CM

## 2016-08-01 DIAGNOSIS — E039 Hypothyroidism, unspecified: Secondary | ICD-10-CM

## 2016-08-01 MED ORDER — SODIUM CHLORIDE 0.9 % IV SOLN: 500.0000 mL | INTRAVENOUS | Status: DC

## 2016-08-01 NOTE — Patient Instructions (Signed)
YOU HAD AN ENDOSCOPIC PROCEDURE TODAY AT [facility]:   Refer to the procedure report that was given to you for any specific questions about what was found during the examination.  If the procedure report does not answer your questions, please call your gastroenterologist to clarify.  If you requested that your care partner not be given the details of your procedure findings, then the procedure report has been included in a sealed envelope for you to review at your convenience later.  YOU SHOULD EXPECT: Some feelings of bloating in the abdomen. Passage of more gas than usual.  Walking can help get rid of the air that was put into your GI tract during the procedure and reduce the bloating. If you had a lower endoscopy (such as a colonoscopy or flexible sigmoidoscopy) you may notice spotting of blood in your stool or on the toilet paper. If you underwent a bowel prep for your procedure, you may not have a normal bowel movement for a few days.  Please Note:  You might notice some irritation and congestion in your nose or some drainage.  This is from the oxygen used during your procedure.  There is no need for concern and it should clear up in a day or so.  SYMPTOMS TO REPORT IMMEDIATELY:   Following lower endoscopy (colonoscopy or flexible sigmoidoscopy):  Excessive amounts of blood in the stool  Significant tenderness or worsening of abdominal pains  Swelling of the abdomen that is new, acute  Fever of 100F or higher  For urgent or emergent issues, a gastroenterologist can be reached at any hour by calling [phone].  Please read all handouts given to you by your recovery Rn.  DIET:  We do recommend a small meal at first, but then you may proceed to your regular diet.  Drink plenty of fluids but you should avoid alcoholic beverages for 24 hours.  ACTIVITY:  You should plan to take it easy for the rest of today and you should NOT DRIVE or use heavy machinery until tomorrow  (because of the sedation medicines used during the test).    FOLLOW UP: Our staff will call the number listed on your records the next business day following your procedure to check on you and address any questions or concerns that you may have regarding the information given to you following your procedure. If we do not reach you, we will leave a message.  However, if you are feeling well and you are not experiencing any problems, there is no need to return our call.  We will assume that you have returned to your regular daily activities without incident.  If any biopsies were taken you will be contacted by phone or by letter within the next 1-3 weeks.  Please call us at [phone] if you have not heard about the biopsies in 3 weeks.    SIGNATURES/CONFIDENTIALITY: You and/or your care partner have signed paperwork which will be entered into your electronic medical record.  These signatures attest to the fact that that the information above on your After Visit Summary has been reviewed and is understood.  Full responsibility of the confidentiality of this discharge information lies with you and/or your care-partner.  Thank you for letting us take care of your healthcare needs today.

## 2016-08-01 NOTE — Op Note (Signed)
[facility] Patient Name: Laurence Pirkey Procedure Date: 08/01/2016 11:28 AM MRN: [id number] Endoscopist: Mauri Pole , MD Age: 50 Referring MD:  Date of Birth: 03-02-1966 Gender: Female Account #: [id number] Procedure:                Colonoscopy Indications:              Screening for colorectal malignant neoplasm, This                            is the patient's first colonoscopy Medicines:                Monitored Anesthesia Care Procedure:                Pre-Anesthesia Assessment:                           - Prior to the procedure, a History and Physical                            was performed, and patient medications and                            allergies were reviewed. The patient's tolerance of                            previous anesthesia was also reviewed. The risks                            and benefits of the procedure and the sedation                            options and risks were discussed with the patient.                            All questions were answered, and informed consent                            was obtained. Prior Anticoagulants: The patient has                            taken no previous anticoagulant or antiplatelet                            agents. ASA Grade Assessment: II - A patient with                            mild systemic disease. After reviewing the risks                            and benefits, the patient was deemed in                            satisfactory condition to undergo the procedure.  After obtaining informed consent, the colonoscope                            was passed under direct vision. Throughout the                            procedure, the patient's blood pressure, pulse, and                            oxygen saturations were monitored continuously. The                            Model CF-HQ190L [id number]) scope was introduced                            through the anus and  advanced to the the cecum,                            identified by appendiceal orifice and ileocecal                            valve. The colonoscopy was performed without                            difficulty. The patient tolerated the procedure                            well. The quality of the bowel preparation was                            excellent. The ileocecal valve, appendiceal                            orifice, and rectum were photographed. The quality                            of the bowel preparation was evaluated using the                            BBPS [facility] The Bowel Preparation Scale) with scores                            of: Right Colon = 3, Transverse Colon = 3 and Left                            Colon = 3 (entire mucosa seen well with no residual                            staining, small fragments of stool or opaque                            liquid). The total BBPS score equals 9. Scope In: 11:38:43 AM Scope Out: 11:56:00 AM Scope Withdrawal Time:  0 hours 12 minutes 6 seconds  Total Procedure Duration: 0 hours 17 minutes 17 seconds  Findings:                 The perianal and digital rectal examinations were                            normal.                           The entire examined colon appeared normal. Complications:            No immediate complications. Estimated Blood Loss:     Estimated blood loss: none. Impression:               - The entire examined colon is normal.                           - No specimens collected. Recommendation:           - Patient has a contact number available for                            emergencies. The signs and symptoms of potential                            delayed complications were discussed with the                            patient. Return to normal activities tomorrow.                            Written discharge instructions were provided to the                            patient.                           -  Resume previous diet.                           - Continue present medications.                           - Repeat colonoscopy in 10 years for screening                            purposes. Mauri Pole, MD 08/01/2016 12:01:07 PM This report has been signed electronically.

## 2016-08-01 NOTE — Progress Notes (Signed)
A and O x3. Report to RN. Tolerated MAC anesthesia well. 

## 2016-08-01 NOTE — Progress Notes (Signed)
Subjective:    Patient ID: Shelby Espinoza, female    DOB: 09-22-1966, 50 y.o.   MRN: [id number]  HPI  Patient presents for yearly preventative medicine examination. She is a pleasant 50 year old female who  has a past medical history of GERD (gastroesophageal reflux disease) and Hypothyroidism.  All immunizations and health maintenance protocols were reviewed with the patient and needed orders were placed.  Medication reconciliation,  past medical history, social history, problem list and allergies were reviewed in detail with the patient  Goals were established with regard to weight loss, exercise, and  diet in compliance with medications. She is eating healthy and tries to walk a few times a week   She takes Synthroid 56mcg and cytomel 12.5 mcg for hypothyroidism   She is up to date on her dental and vision exams. She has her colonoscopy this morning. She has had a mammogram this year.   She has a GYN and is up to date on her PAP exams.   Since finishing protonix she has not had any GERD like symptoms   Denies any acute complaints.   Review of Systems  Constitutional: Negative.   HENT: Negative.   Eyes: Negative.   Respiratory: Negative.   Cardiovascular: Negative.   Gastrointestinal: Negative.   Endocrine: Negative.   Musculoskeletal: Negative.   Skin: Negative.   Allergic/Immunologic: Negative.   Neurological: Negative.   Hematological: Negative.   Psychiatric/Behavioral: Negative.   All other systems reviewed and are negative.  Past Medical History:  Diagnosis Date  . GERD (gastroesophageal reflux disease)   . Hypothyroidism     Social History   Social History  . Marital status: Married    Spouse name: N/A  . Number of children: N/A  . Years of education: N/A   Occupational History  . Not on file.   Social History Main Topics  . Smoking status: Never Smoker  . Smokeless tobacco: Never Used  . Alcohol use No  . Drug use: No  . Sexual activity: Yes    Birth control/ protection: None   Other Topics Concern  . 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 APPENDECTOMY    . BREAST REDUCTION SURGERY    . CESAREAN SECTION    . TUBAL LIGATION      Family History  Problem Relation Age of Onset  . Alcohol abuse Father   . Throat cancer Father   . Heart disease Father   . Hypertension Father   . Breast cancer Mother   . Breast cancer Maternal Aunt   . Heart disease Paternal Aunt   . Colon cancer Neg Hx     No Known Allergies  Current Outpatient Prescriptions on File Prior to Visit  Medication Sig Dispense Refill  . levothyroxine (SYNTHROID, LEVOTHROID) 50 MCG tablet Take 1 tablet (50 mcg total) by mouth daily before breakfast. 90 tablet 3  . liothyronine (CYTOMEL) 25 MCG tablet TAKE 1/2 TABLET BY MOUTH EVERY DAY 90 tablet 3  . Probiotic Product (PROBIOTIC DAILY PO) Take 1 capsule by mouth daily.     No current facility-administered medications on file prior to visit.     BP 120/72  Temp 98.6 F (37 C) (Oral)   Ht 5\' 3"  (1.6 m)   Wt 173 lb 1.6 oz (78.5 kg)   LMP 07/05/2016   BMI 30.66 kg/m       Objective:   Physical Exam  Constitutional: She is oriented to person, place, and time. She appears well-developed and well-nourished. No distress.  HENT:  Head: Normocephalic and atraumatic.  Right Ear: External ear normal.  Left Ear: External ear normal.  Nose: Nose normal.  Mouth/Throat: Oropharynx is clear and moist. No oropharyngeal exudate.  Eyes: Conjunctivae and EOM are normal. Pupils are equal, round, and reactive to light. Right eye exhibits no discharge. Left eye exhibits no discharge. No scleral icterus.  Neck: Normal range of motion. Neck supple. No tracheal deviation present. No thyromegaly present.    Cardiovascular: Normal rate, regular rhythm, normal heart sounds and intact distal pulses.  Exam reveals no gallop and no friction rub.   No murmur heard. Pulmonary/Chest: Effort normal and breath sounds normal. No respiratory distress. She has no wheezes. She has no rales. She exhibits no tenderness.  Abdominal: Soft. Bowel sounds are normal. She exhibits no distension and no mass. There is no tenderness. There is no rebound and no guarding.  Genitourinary:  Genitourinary Comments: Deferred to GYN. She did not want a breast exam   Musculoskeletal: Normal range of motion. She exhibits no edema, tenderness or deformity.  Lymphadenopathy:    She has no cervical adenopathy.  Neurological: She is alert and oriented to person, place, and time. She has normal reflexes. She displays normal reflexes. No cranial nerve deficit. She exhibits normal muscle tone. Coordination normal.  Skin: Skin is warm and dry. No rash noted. She is not diaphoretic. No erythema. No pallor.  Psychiatric: She has a normal mood and affect. Her behavior is normal. Judgment and thought content normal.  Nursing note and vitals reviewed.     Assessment & Plan:  1. Routine general medical examination at a health care facility - Reviewed labs in detail with patient. All questions answered - Work in exercising more often throughout the week  - Follow up in one year for next CPE - Follow up sooner if needed - Will await colonoscopy report  2. Gastroesophageal reflux disease without esophagitis - Resolved - Try and avoid acidic/spicy foods - Follow up as needed  3. Hypothyroidism, unspecified type - Well controlled - No change in therapy at this time  Dorothyann Peng, NP

## 2016-08-02 ENCOUNTER — Telehealth: Payer: Self-pay

## 2016-08-02 NOTE — Telephone Encounter (Signed)
  Follow up Call-  Call back number 08/01/2016  Post procedure Call Back phone  # [phone]  Permission to leave phone message Yes  Some recent data might be hidden     Patient questions:  Do you have a fever, pain , or abdominal swelling? No. Pain Score  0 *  Have you tolerated food without any problems? Yes.    Have you been able to return to your normal activities? Yes.    Do you have any questions about your discharge instructions: Diet   No. Medications  No. Follow up visit  No.  Do you have questions or concerns about your Care? No.  Actions: * If pain score is 4 or above: No action needed, pain <4.   No problems per the pt. maw

## 2017-01-28 DIAGNOSIS — Z803 Family history of malignant neoplasm of breast: Secondary | ICD-10-CM | POA: Diagnosis not present

## 2017-01-28 DIAGNOSIS — Z1231 Encounter for screening mammogram for malignant neoplasm of breast: Secondary | ICD-10-CM | POA: Diagnosis not present

## 2017-01-28 LAB — HM MAMMOGRAPHY

## 2017-02-04 ENCOUNTER — Encounter: Payer: Self-pay | Admitting: Family Medicine

## 2017-04-24 ENCOUNTER — Other Ambulatory Visit: Payer: Self-pay | Admitting: Adult Health

## 2017-04-24 NOTE — Telephone Encounter (Signed)
Sent to the pharmacy by e-scribe for 90 days.  Pt had cpx with Cory on 08/01/16 and lab work on 07/23/16.  TSH normal.

## 2017-04-26 ENCOUNTER — Encounter: Payer: Self-pay | Admitting: Family Medicine

## 2017-04-26 ENCOUNTER — Ambulatory Visit (INDEPENDENT_AMBULATORY_CARE_PROVIDER_SITE_OTHER): Payer: BLUE CROSS/BLUE SHIELD | Admitting: Family Medicine

## 2017-04-26 VITALS — BP 110/80 | HR 84 | Temp 98.3°F | Wt 176.8 lb

## 2017-04-26 DIAGNOSIS — S40862A Insect bite (nonvenomous) of left upper arm, initial encounter: Secondary | ICD-10-CM

## 2017-04-26 DIAGNOSIS — W57XXXA Bitten or stung by nonvenomous insect and other nonvenomous arthropods, initial encounter: Secondary | ICD-10-CM | POA: Diagnosis not present

## 2017-04-26 NOTE — Progress Notes (Signed)
Subjective:     Patient ID: Shelby Espinoza, female   DOB: Mar 07, 1966, 51 y.o.   MRN: [id number]  HPI Seen to evaluate bite left arm. She was in [place] a little over a week ago and was bitten by something that looked like an ant. Insect was reddish in color. She noticed some itching and had some blistering afterwards. This has improved considerably over the past week. She's not any fevers or chills. No generalized rash. Mild itching.  Past Medical History:  Diagnosis Date  . GERD (gastroesophageal reflux disease)   . Hypothyroidism    Past Surgical History:  Procedure Laterality Date  . APPENDECTOMY    . BREAST REDUCTION SURGERY    . CESAREAN SECTION    . TUBAL LIGATION      reports that she has never smoked. She has never used smokeless tobacco. She reports that she does not drink alcohol or use drugs. family history includes Alcohol abuse in her father; Breast cancer in her maternal aunt and mother; Heart disease in her father and paternal aunt; Hypertension in her father; Throat cancer in her father. No Known Allergies   Review of Systems  Constitutional: Negative for chills and fever.       Objective:   Physical Exam  Constitutional: She appears well-developed and well-nourished.  Cardiovascular: Normal rate and regular rhythm.   Pulmonary/Chest: Effort normal and breath sounds normal. No respiratory distress. She has no wheezes. She has no rales.  Skin:  Small eschar about 2 mm diameter left anterior biceps region. No surrounding erythema. No visible retained insect parts       Assessment:     Insect bite/sting with localized irritation-improving    Plan:     -Recommend observation. -Tried over-the-counter topical cortisone cream for itching  Eulas Post MD  Primary Care at [facility]

## 2017-07-23 ENCOUNTER — Other Ambulatory Visit: Payer: Self-pay | Admitting: Adult Health

## 2017-07-23 NOTE — Telephone Encounter (Signed)
Sent to the pharmacy by e-scribe for 90 days.  Pt is scheduled for cpx on 08/28/17 @ 8 AM.

## 2017-08-14 DIAGNOSIS — J00 Acute nasopharyngitis [common cold]: Secondary | ICD-10-CM | POA: Diagnosis not present

## 2017-08-14 DIAGNOSIS — R196 Halitosis: Secondary | ICD-10-CM | POA: Diagnosis not present

## 2017-08-28 ENCOUNTER — Encounter: Payer: Self-pay | Admitting: Adult Health

## 2017-08-28 ENCOUNTER — Ambulatory Visit (INDEPENDENT_AMBULATORY_CARE_PROVIDER_SITE_OTHER): Payer: BLUE CROSS/BLUE SHIELD | Admitting: Adult Health

## 2017-08-28 VITALS — BP 110/78 | Temp 97.7°F | Ht 63.5 in | Wt 180.0 lb

## 2017-08-28 DIAGNOSIS — E039 Hypothyroidism, unspecified: Secondary | ICD-10-CM

## 2017-08-28 DIAGNOSIS — Z0000 Encounter for general adult medical examination without abnormal findings: Secondary | ICD-10-CM

## 2017-08-28 LAB — BASIC METABOLIC PANEL
BUN: 16 mg/dL (ref 6–23)
CO2: 32 mEq/L (ref 19–32)
Calcium: 9.5 mg/dL (ref 8.4–10.5)
Chloride: 102 mEq/L (ref 96–112)
Creatinine, Ser: 0.99 mg/dL (ref 0.40–1.20)
GFR: 75.6 mL/min (ref >60.00)
Glucose, Bld: 86 mg/dL (ref 70–99)
Potassium: 4.2 mEq/L (ref 3.5–5.1)
Sodium: 140 mEq/L (ref 135–145)

## 2017-08-28 LAB — LIPID PANEL
Cholesterol: 166 mg/dL (ref 0–200)
HDL: 37.5 mg/dL — ABNORMAL LOW (ref >39.00)
LDL Cholesterol: 112 mg/dL — ABNORMAL HIGH (ref 0–99)
NonHDL: 128.01
Total CHOL/HDL Ratio: 4
Triglycerides: 82 mg/dL (ref 0.0–149.0)
VLDL: 16.4 mg/dL (ref 0.0–40.0)

## 2017-08-28 LAB — CBC WITH DIFFERENTIAL/PLATELET
Basophils Absolute: 0 10*3/uL (ref 0.0–0.1)
Basophils Relative: 0.3 % (ref 0.0–3.0)
Eosinophils Absolute: 0 10*3/uL (ref 0.0–0.7)
Eosinophils Relative: 0.9 % (ref 0.0–5.0)
HCT: 41 % (ref 36.0–46.0)
Hemoglobin: 13.2 g/dL (ref 12.0–15.0)
Lymphocytes Relative: 44.8 % (ref 12.0–46.0)
Lymphs Abs: 2.2 10*3/uL (ref 0.7–4.0)
MCHC: 32.2 g/dL (ref 30.0–36.0)
MCV: 72.6 fl — ABNORMAL LOW (ref 78.0–100.0)
Monocytes Absolute: 0.3 10*3/uL (ref 0.1–1.0)
Monocytes Relative: 5.3 % (ref 3.0–12.0)
Neutro Abs: 2.4 10*3/uL (ref 1.4–7.7)
Neutrophils Relative %: 48.7 % (ref 43.0–77.0)
Platelets: 229 10*3/uL (ref 150.0–400.0)
RBC: 5.66 Mil/uL — ABNORMAL HIGH (ref 3.87–5.11)
RDW: 18.5 % — ABNORMAL HIGH (ref 11.5–15.5)
WBC: 5 10*3/uL (ref 4.0–10.5)

## 2017-08-28 LAB — HEPATIC FUNCTION PANEL
ALT: 10 U/L (ref 0–35)
AST: 13 U/L (ref 0–37)
Albumin: 4.3 g/dL (ref 3.5–5.2)
Alkaline Phosphatase: 59 U/L (ref 39–117)
Bilirubin, Direct: 0.2 mg/dL (ref 0.0–0.3)
Total Bilirubin: 1.3 mg/dL — ABNORMAL HIGH (ref 0.2–1.2)
Total Protein: 7.1 g/dL (ref 6.0–8.3)

## 2017-08-28 LAB — HEMOGLOBIN A1C: Hgb A1c MFr Bld: 5.8 % (ref 4.6–6.5)

## 2017-08-28 LAB — T4, FREE: Free T4: 0.71 ng/dL (ref 0.60–1.60)

## 2017-08-28 LAB — TSH: TSH: 2.19 u[IU]/mL (ref 0.35–4.50)

## 2017-08-28 LAB — T3, FREE: T3, Free: 3.9 pg/mL (ref 2.3–4.2)

## 2017-08-28 NOTE — Progress Notes (Signed)
Subjective:    Patient ID: Shelby Espinoza, female    DOB: 1966/07/09, 51 y.o.   MRN: [id number]  HPI  Patient presents for yearly preventative medicine examination. She is a pleasant 51 year old female who  has a past medical history of GERD (gastroesophageal reflux disease) and Hypothyroidism.  She takes synthroid 50 mcg and cytomel 12.5 mg  for hypothyroidism   All immunizations and health maintenance protocols were reviewed with the patient and needed orders were placed.  Appropriate screening laboratory values were ordered for the patient including screening of hyperlipidemia, renal function and hepatic function.  Medication reconciliation,  past medical history, social history, problem list and allergies were reviewed in detail with the patient  Goals were established with regard to weight loss, exercise, and  diet in compliance with medications. She has been working out twice a week at a gym and TRIES to walk in her neighborhood 4 times a week. She is trying to eat healthy .   Wt Readings from Last 3 Encounters:  08/28/17 180 lb (81.6 kg)  04/26/17 176 lb 12.8 oz (80.2 kg)  08/01/16 180 lb (81.6 kg)    End of life planning was discussed.  She is up to date on her dental and vision exams. She has her colonoscopy in 2017( 10 year plan).. She has had a mammogram this year.   She has a GYN and is up to date on her PAP  She denies any acute complaint and/or interval history    Review of Systems  Constitutional: Negative.   HENT: Negative.   Eyes: Negative.   Respiratory: Negative.   Cardiovascular: Negative.   Gastrointestinal: Negative.   Endocrine: Negative.   Genitourinary: Negative.   Musculoskeletal: Negative.   Skin: Negative.   Allergic/Immunologic: Negative.   Neurological: Negative.   Hematological: Negative.   Psychiatric/Behavioral: Negative.   All other systems reviewed and are negative.  Past Medical History:  Diagnosis Date  . GERD (gastroesophageal  reflux disease)   . Hypothyroidism     Social History   Socioeconomic History  . Marital status: Married    Spouse name: Not on file  . Number of children: Not on file  . Years of education: Not on file  . Highest education level: Not on file  Social Needs  . Financial resource strain: Not on file  . Food insecurity - worry: Not on file  . Food insecurity - inability: Not on file  . Transportation needs - medical: Not on file  . Transportation needs - non-medical: Not on file  Occupational History  . Not on file  Tobacco Use  . Smoking status: Never Smoker  . Smokeless tobacco: Never Used  Substance and Sexual Activity  . Alcohol use: No  . Drug use: No  . Sexual activity: Yes    Birth control/protection: None  Other Topics Concern  . 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 APPENDECTOMY    . BREAST REDUCTION SURGERY    . CESAREAN SECTION    . TUBAL LIGATION      Family History  Problem Relation Age of Onset  . Alcohol abuse Father   . Throat cancer Father   . Heart disease  Father   . Hypertension Father   . Breast cancer Mother   . Breast cancer Maternal Aunt   . Heart disease Paternal Aunt   . Colon cancer Neg Hx     No Known Allergies  Current Outpatient Medications on File Prior to Visit  Medication Sig Dispense Refill  . fluticasone (FLONASE) 50 MCG/ACT nasal spray Place into the nose.    . levothyroxine (SYNTHROID, LEVOTHROID) 50 MCG tablet TAKE 1 TABLET(50 MCG) BY MOUTH DAILY BEFORE BREAKFAST 90 tablet 0  . liothyronine (CYTOMEL) 25 MCG tablet TAKE 1/2 TABLET BY MOUTH EVERY DAY 90 tablet 3   Current Facility-Administered Medications on File Prior to Visit  Medication Dose Route Frequency Provider Last Rate Last Dose  . 0.9 %  sodium chloride  infusion  500 mL Intravenous Continuous Nandigam, Kavitha V, MD        BP 110/78 (BP Location: Left Arm)   Temp 97.7 F (36.5 C) (Oral)   Ht 5' 3.5" (1.613 m)   Wt 180 lb (81.6 kg)   LMP 11/01/2016   BMI 31.39 kg/m       Objective:   Physical Exam  Constitutional: She is oriented to person, place, and time. She appears well-developed and well-nourished. No distress.  HENT:  Head: Normocephalic and atraumatic.  Right Ear: External ear normal.  Left Ear: External ear normal.  Nose: Nose normal.  Mouth/Throat: Oropharynx is clear and moist. No oropharyngeal exudate.  Eyes: Conjunctivae and EOM are normal. Pupils are equal, round, and reactive to light. Right eye exhibits no discharge. Left eye exhibits no discharge. No scleral icterus.  Neck: Normal range of motion. Neck supple. No JVD present. No tracheal deviation present. No thyromegaly present.  Cardiovascular: Normal rate, regular rhythm, normal heart sounds and intact distal pulses. Exam reveals no gallop and no friction rub.  No murmur heard. Pulmonary/Chest: Effort normal and breath sounds normal. No stridor. No respiratory distress. She has no wheezes. She has no rales. She exhibits no tenderness.  Abdominal: Soft. Bowel sounds are normal. She exhibits no distension and no mass. There is no tenderness. There is no rebound and no guarding.  Genitourinary:  Genitourinary Comments: Deferred: Done by GYN   Musculoskeletal: Normal range of motion. She exhibits no edema, tenderness or deformity.  Lymphadenopathy:    She has no cervical adenopathy.  Neurological: She is alert and oriented to person, place, and time. She has normal reflexes. She displays normal reflexes. No cranial nerve deficit. She exhibits normal muscle tone. Coordination normal.  Skin: Skin is warm and dry. No rash noted. She is not diaphoretic. No erythema. No pallor.  Psychiatric: She has a normal mood and affect. Her behavior is normal. Judgment and thought  content normal.  Nursing note and vitals reviewed.     Assessment & Plan:  1. Routine general medical examination at a health care facility - Encouraged more frequent gym time. Continue to eat healthy  - Follow up in one year or sooner if needed - Basic metabolic panel - CBC with Differential/Platelet - Hepatic function panel - Hemoglobin A1c - Lipid panel - TSH - T4, Free - T3, Free  2. Hypothyroidism, unspecified type - Consider increasing synthroid  - Basic metabolic panel - CBC with Differential/Platelet - Hepatic function panel - Hemoglobin A1c - Lipid panel - TSH - T4, Free - T3, Free  Dorothyann Peng, NP

## 2017-08-29 ENCOUNTER — Encounter: Payer: Self-pay | Admitting: Family Medicine

## 2017-10-22 ENCOUNTER — Other Ambulatory Visit: Payer: Self-pay | Admitting: Adult Health

## 2018-02-11 DIAGNOSIS — Z1231 Encounter for screening mammogram for malignant neoplasm of breast: Secondary | ICD-10-CM | POA: Diagnosis not present

## 2018-02-11 DIAGNOSIS — Z803 Family history of malignant neoplasm of breast: Secondary | ICD-10-CM | POA: Diagnosis not present

## 2018-02-11 LAB — HM MAMMOGRAPHY

## 2018-02-18 ENCOUNTER — Encounter: Payer: Self-pay | Admitting: Family Medicine

## 2018-02-19 ENCOUNTER — Ambulatory Visit: Payer: BLUE CROSS/BLUE SHIELD | Admitting: Adult Health

## 2018-02-22 ENCOUNTER — Ambulatory Visit: Payer: BLUE CROSS/BLUE SHIELD | Admitting: Family Medicine

## 2018-02-22 ENCOUNTER — Encounter: Payer: Self-pay | Admitting: Family Medicine

## 2018-02-22 VITALS — BP 120/70 | HR 70 | Temp 98.6°F | Resp 14 | Ht 63.5 in | Wt 182.0 lb

## 2018-02-22 DIAGNOSIS — R21 Rash and other nonspecific skin eruption: Secondary | ICD-10-CM | POA: Diagnosis not present

## 2018-02-22 MED ORDER — PREDNISONE 20 MG PO TABS: ORAL_TABLET | ORAL | 0 refills | Status: DC

## 2018-02-22 NOTE — Patient Instructions (Signed)
These could be bed bug bites from [place]- hopefully the newer spots are just newly noted and not actually new bites while in [place]  Lets try prednisone to calm down the itch  Lets try benadryl to help with itch as well can use up to every 6 hours at 25mg . If that is too strong for you - could use zyrtec at bedtime. Try not to drive after using benadryl.   If you have any new lesions I would consider hiring a professional service to investigate at hte home now but sounds like you have already done all the right things.   If you continue to have lesions into Tuesday- please see Tommi Rumps back for reevaluation.

## 2018-02-22 NOTE — Progress Notes (Signed)
Subjective:  Shelby Espinoza is a 52 y.o. year old very pleasant female patient who presents for/with See problem oriented charting ROS-not ill appearing, no fever/chills. No new medications. Not immunocompromised. No mucus membrane involvement.  No lip or tongue swelling.   Past Medical History-  Patient Active Problem List   Diagnosis Date Noted  . Hypothyroidism 05/02/2007    Medications- reviewed and updated Current Outpatient Medications  Medication Sig Dispense Refill  . fluticasone (FLONASE) 50 MCG/ACT nasal spray Place into the nose.    . levothyroxine (SYNTHROID, LEVOTHROID) 50 MCG tablet TAKE 1 TABLET(50 MCG) BY MOUTH DAILY BEFORE BREAKFAST 90 tablet 3  . liothyronine (CYTOMEL) 25 MCG tablet Take 0.5 tablets (12.5 mcg total) by mouth daily. 45 tablet 3  . predniSONE (DELTASONE) 20 MG tablet Take 1 tablet by mouth daily for 5 days, then 1/2 tablet daily for 2 days 6 tablet 0   Current Facility-Administered Medications  Medication Dose Route Frequency Provider Last Rate Last Dose  . 0.9 %  sodium chloride infusion  500 mL Intravenous Continuous Nandigam, Kavitha V, MD        Objective: BP 120/70 (BP Location: Left Arm, Patient Position: Sitting, Cuff Size: Normal)   Pulse 70   Temp 98.6 F (37 C) (Oral)   Resp 14   Ht 5' 3.5" (1.613 m)   Wt 182 lb (82.6 kg)   SpO2 96%   BMI 31.73 kg/m  Gen: NAD, resting comfortably CV: Regular rate and rhythm Lungs: Nonlabored, normal respiratory rate Abdomen: soft/nontender/nondistended Skin: warm, dry, multiple erythematous papules through the legs, arms, trunk.  Excoriation on several of these  Assessment/Plan:  Rash S:  red itchy bumps since being down in [place]. There for a funeral. Down there last weekend and funeral was Saturday and Sunday then drove back. Noted bumps starting on Monday morning. Seems to have gotten new lesions through yesterday. Today is the first day they havent had new spots.  They didn't sleep in  their room last night and didn't see new bites today.   They threw the bag away that they used for travel, they have washed everything with hot water, they sent dress clothes to dry cleaner so those aret home. hey are doing a bed bug bomb in bedroom right now.   She was also concerned about one of family members homes that they stayed at was rather unclean  A/P: This could be contact dermatitis or bed bugs.  She was not interested in topical options due to how extensive the lesions are.  She was not interested in steroid injection.   From avs "These could be bed bug bites from [place]- hopefully the newer spots are just newly noted and not actually new bites while in [place]  Lets try prednisone to calm down the itch  Lets try benadryl to help with itch as well can use up to every 6 hours at 25mg . If that is too strong for you - could use zyrtec at bedtime. Try not to drive after using benadryl.   If you have any new lesions I would consider hiring a professional service to investigate at hte home now but sounds like you have already done all the right things.   If you continue to have lesions into Tuesday- please see Tommi Rumps back for reevaluation. "  Meds ordered this encounter  Medications  . predniSONE (DELTASONE) 20 MG tablet    Sig: Take 1 tablet by mouth daily for 5 days, then 1/2 tablet  daily for 2 days    Dispense:  6 tablet    Refill:  0   Return precautions advised.  Garret Reddish, MD

## 2018-05-17 ENCOUNTER — Encounter: Payer: Self-pay | Admitting: Family Medicine

## 2018-05-17 ENCOUNTER — Ambulatory Visit: Payer: BLUE CROSS/BLUE SHIELD | Admitting: Family Medicine

## 2018-05-17 DIAGNOSIS — J069 Acute upper respiratory infection, unspecified: Secondary | ICD-10-CM | POA: Insufficient documentation

## 2018-05-17 NOTE — Progress Notes (Signed)
   Subjective:    Patient ID: Shelby Espinoza, female    DOB: 05/09/1966, 52 y.o.   MRN: [id number]  Sore Throat   This is a new problem. The current episode started in the past 7 days. The problem has been gradually worsening. There has been no fever. The pain is at a severity of 9/10. Associated symptoms include congestion, coughing and trouble swallowing. Pertinent negatives include no drooling, ear discharge, ear pain, headaches or shortness of breath. Associated symptoms comments:  . She has had no exposure to strep or mono. Exposure to: co workers with cough and ST. Treatments tried:  saline rinse, salt water gargle. The treatment provided no relief.    No antibiotics in last 30 days.  No recurrent strep history   Blood pressure 130/78, pulse 64, temperature 98.6 F (37 C), temperature source Oral, resp. rate 16, height 5' 3.5" (1.613 m), weight 180 lb (81.6 kg), SpO2 99 %.    Review of Systems  HENT: Positive for congestion and trouble swallowing. Negative for drooling, ear discharge and ear pain.   Respiratory: Positive for cough. Negative for shortness of breath.   Neurological: Negative for headaches.       Objective:   Physical Exam  Constitutional: Vital signs are normal. She appears well-developed and well-nourished. She is cooperative.  Non-toxic appearance. She does not appear ill. No distress.  HENT:  Head: Normocephalic.  Right Ear: Hearing, tympanic membrane, external ear and ear canal normal. Tympanic membrane is not erythematous, not retracted and not bulging.  Left Ear: Hearing, tympanic membrane, external ear and ear canal normal. Tympanic membrane is not erythematous, not retracted and not bulging.  Nose: Mucosal edema and rhinorrhea present. Right sinus exhibits no maxillary sinus tenderness and no frontal sinus tenderness. Left sinus exhibits no maxillary sinus tenderness and no frontal sinus tenderness.  Mouth/Throat: Uvula is midline and mucous membranes are  normal. Posterior oropharyngeal erythema present. No oropharyngeal exudate or posterior oropharyngeal edema.  Eyes: Pupils are equal, round, and reactive to light. Conjunctivae, EOM and lids are normal. Lids are everted and swept, no foreign bodies found.  Neck: Trachea normal and normal range of motion. Neck supple. Carotid bruit is not present. No thyroid mass and no thyromegaly present.  Cardiovascular: Normal rate, regular rhythm, S1 normal, S2 normal, normal heart sounds, intact distal pulses and normal pulses. Exam reveals no gallop and no friction rub.  No murmur heard. Pulmonary/Chest: Effort normal and breath sounds normal. No tachypnea. No respiratory distress. She has no decreased breath sounds. She has no wheezes. She has no rhonchi. She has no rales.  Neurological: She is alert.  Skin: Skin is warm, dry and intact. No rash noted.  Psychiatric: Her speech is normal and behavior is normal. Judgment normal. Her mood appears not anxious. Cognition and memory are normal. She does not exhibit a depressed mood.          Assessment & Plan:

## 2018-05-17 NOTE — Patient Instructions (Signed)
Neg strep test. Rest, fluids, ibuprofen 800 mg every eight hours for sore throat.

## 2018-05-17 NOTE — Assessment & Plan Note (Signed)
Neg strep test. Most liekly viral URI.. Treat symptomatically.

## 2018-07-22 DIAGNOSIS — N816 Rectocele: Secondary | ICD-10-CM | POA: Diagnosis not present

## 2018-09-22 ENCOUNTER — Ambulatory Visit: Payer: Self-pay | Admitting: *Deleted

## 2018-09-22 ENCOUNTER — Ambulatory Visit: Payer: BLUE CROSS/BLUE SHIELD | Admitting: Family Medicine

## 2018-09-22 ENCOUNTER — Encounter: Payer: Self-pay | Admitting: Family Medicine

## 2018-09-22 VITALS — BP 120/70 | HR 78 | Temp 97.9°F | Wt 183.6 lb

## 2018-09-22 DIAGNOSIS — M25562 Pain in left knee: Secondary | ICD-10-CM

## 2018-09-22 MED ORDER — ACE KNEE BRACE LARGE/X-LARGE MISC: 1.0000 | Freq: Every day | 0 refills | Status: DC

## 2018-09-22 MED ORDER — NAPROXEN 500 MG PO TABS: 500.0000 mg | ORAL_TABLET | Freq: Two times a day (BID) | ORAL | 0 refills | Status: DC

## 2018-09-22 NOTE — Telephone Encounter (Addendum)
Noted. FYI 

## 2018-09-22 NOTE — Telephone Encounter (Signed)
See note

## 2018-09-22 NOTE — Telephone Encounter (Signed)
Pt called with complaints of shooting pain in her left leg; she also says that the leg is swollen and her varicose veins are worse; she also notices new varicose veins; she says that her knee buckled 2 weeks ago but she thought it was associated with weight gain due to being more sedentary; she is most concerned about the leg pain; it is worse intermittently; she states that she took a bayer aspirin; she is concerned about blood clots blood clots because she is more sedentary and she is flying out 09/23/18 for the holiday; the pt says that there is a new scar on her left leg; nurse triage initiated and recommendations made per protocol; the pt normally sees [facility] but he has no availability; she is agreeable to seeing another provider; pt offered and accepted appointment with Dr Micheline Rough, [facility], 09/22/18 at 0930; she verbalized understanding; will route to office for notification,     Reason for Disposition . [1] MODERATE pain (e.g., interferes with normal activities, limping) AND [2] present > 3 days  Answer Assessment - Initial Assessment Questions 1. ONSET: "When did the pain start?"      09/18/18 2. LOCATION: "Where is the pain located?"      Left knee to down front of calf to foot 3. PAIN: "How bad is the pain?"    (Scale 1-10; or mild, moderate, severe)   -  MILD (1-3): doesn't interfere with normal activities    -  MODERATE (4-7): interferes with normal activities (e.g., work or school) or awakens from sleep, limping    -  SEVERE (8-10): excruciating pain, unable to do any normal activities, unable to walk     Rated 6 out of 10 4. WORK OR EXERCISE: "Has there been any recent work or exercise that involved this part of the body?"      no 5. CAUSE: "What do you think is causing the leg pain?"     Not sure 6. OTHER SYMPTOMS: "Do you have any other symptoms?" (e.g., chest pain, back pain, breathing difficulty, swelling, rash, fever, numbness, weakness)     Leg and foot  swelling, new varicose veins; old varicose veins are worse 7. PREGNANCY: "Is there any chance you are pregnant?" "When was your last menstrual period?"     no  Protocols used: LEG PAIN-A-AH

## 2018-09-22 NOTE — Progress Notes (Signed)
  Shelby Espinoza DOB: 1966-06-17 Encounter date: 09/22/2018  This is a 52 y.o. female who presents with Chief Complaint  Patient presents with  . Leg Swelling    x 3 weeks, left leg, knee swelling, radiating pain from knee down leg, 3/10 pain, pain in not consant    History of present illness:  Something going on with left knee - worse if twisting to left. Pain shooting down from knee to top of foot. Feels like it is going to give out sometimes. Started noticing with going up steps - would get a twinge or funny feeling, but now getting worse. Getting worse. Nothing she has tried to do; doesn't like to take medications. Right knee feels ok.   Does get some swelling and feels that there is swelling from top of foot up to knee.   Was previously exercising regularly, but has been a little more sedentary with longer commute that she is doing now.     No Known Allergies Current Meds  Medication Sig  . fluticasone (FLONASE) 50 MCG/ACT nasal spray Place into the nose.  . levothyroxine (SYNTHROID, LEVOTHROID) 50 MCG tablet TAKE 1 TABLET(50 MCG) BY MOUTH DAILY BEFORE BREAKFAST   Current Facility-Administered Medications for the 09/22/18 encounter (Office Visit) with Caren Macadam, MD  Medication  . 0.9 %  sodium chloride infusion    Review of Systems  Constitutional: Positive for activity change (working less).  Musculoskeletal: Positive for arthralgias (see hpi).    Objective:  BP 120/70 (BP Location: Left Arm, Patient Position: Sitting, Cuff Size: Normal)   Pulse 78   Temp 97.9 F (36.6 C) (Oral)   Wt 183 lb 9.6 oz (83.3 kg)   SpO2 98%   BMI 32.01 kg/m   Weight: 183 lb 9.6 oz (83.3 kg)   BP Readings from Last 3 Encounters:  09/22/18 120/70  05/17/18 130/78  02/22/18 120/70   Wt Readings from Last 3 Encounters:  09/22/18 183 lb 9.6 oz (83.3 kg)  05/17/18 180 lb (81.6 kg)  02/22/18 182 lb (82.6 kg)    Physical Exam Constitutional:      General: She is not in  acute distress.    Appearance: She is well-developed.  Cardiovascular:     Pulses:          Dorsalis pedis pulses are 2+ on the right side and 2+ on the left side.       Posterior tibial pulses are 2+ on the right side and 2+ on the left side.  Pulmonary:     Effort: Pulmonary effort is normal.  Musculoskeletal:     Comments: Left knee is stable on exam. There is tenderness infrapatellar and slight medial joint line. Range of motion is full. There is not significant crepitance. No posterior tenderness. No tenderness with lateral or medial pressure.    Negative Homans.   Less than trace edema top left foot.   Neurological:     Mental Status: She is alert and oriented to person, place, and time.  Psychiatric:        Behavior: Behavior normal.     Assessment/Plan 1. Acute pain of left knee Nonspecific currently. Ice, rest, anti-inflammatories. Gentle stretches as directed. Let me know if not improved in 2 weeks. Knee sleeve given to help with stability with walking.  Return if symptoms worsen or fail to improve.     Micheline Rough, MD

## 2018-09-22 NOTE — Patient Instructions (Addendum)
Trial of ice after activity, naproxen twice daily with food x 2 weeks. Update me via mychart in 2 weeks time to let me know how knee is feeling!  Acute Knee Pain, Adult Acute knee pain is sudden and may be caused by damage, swelling, or irritation of the muscles and tissues that support your knee. The injury may result from:  A fall.  An injury to your knee from twisting motions.  A hit to the knee.  Infection. Acute knee pain may go away on its own with time and rest. If it does not, your health care provider may order tests to find the cause of the pain. These may include:  Imaging tests, such as an X-ray, MRI, or ultrasound.  Joint aspiration. In this test, fluid is removed from the knee.  Arthroscopy. In this test, a lighted tube is inserted into the knee and an image is projected onto a TV screen.  Biopsy. In this test, a sample of tissue is removed from the body and studied under a microscope. Follow these instructions at home: Pay attention to any changes in your symptoms. Take these actions to relieve your pain. If you have a knee sleeve or brace:   Wear the sleeve or brace as told by your health care provider. Remove it only as told by your health care provider.  Loosen the sleeve or brace if your toes tingle, become numb, or turn cold and blue.  Keep the sleeve or brace clean.  If the sleeve or brace is not waterproof: ? Do not let it get wet. ? Cover it with a watertight covering when you take a bath or shower. Activity  Rest your knee.  Do not do things that cause pain or make pain worse.  Avoid high-impact activities or exercises, such as running, jumping rope, or doing jumping jacks.  Work with a physical therapist to make a safe exercise program, as recommended by your health care provider. Do exercises as told by your physical therapist. Managing pain, stiffness, and swelling   If directed, put ice on the knee: ? Put ice in a plastic bag. ? Place a  towel between your skin and the bag. ? Leave the ice on for 20 minutes, 2-3 times a day.  If directed, use an elastic bandage to put pressure (compression) on your injured knee. This may control swelling, give support, and help with discomfort. General instructions  Take over-the-counter and prescription medicines only as told by your health care provider.  Raise (elevate) your knee above the level of your heart when you are sitting or lying down.  Sleep with a pillow under your knee.  Do not use any products that contain nicotine or tobacco, such as cigarettes, e-cigarettes, and chewing tobacco. These can delay healing. If you need help quitting, ask your health care provider.  If you are overweight, work with your health care provider and a dietitian to set a weight-loss goal that is healthy and reasonable for you. Extra weight can put pressure on your knee.  Keep all follow-up visits as told by your health care provider. This is important. Contact a health care provider if:  Your knee pain continues, changes, or gets worse.  You have a fever along with knee pain.  Your knee feels warm to the touch.  Your knee buckles or locks up. Get help right away if:  Your knee swells, and the swelling becomes worse.  You cannot move your knee.  You have severe pain  in your knee. Summary  Acute knee pain can be caused by a fall, an injury, an infection, or damage, swelling, or irritation of the tissues that support your knee.  Your health care provider may perform tests to find out the cause of the pain.  Pay attention to any changes in your symptoms. Relieve your pain with rest, medicines, light activity, and use of ice.  Get help if your pain continues or becomes worse, your knee swells, or you cannot move your knee. This information is not intended to replace advice given to you by your health care provider. Make sure you discuss any questions you have with your health care  provider. Document Released: 07/15/2007 Document Revised: 02/27/2018 Document Reviewed: 02/27/2018 Elsevier Interactive Patient Education  2019 [employer].   Knee Exercises              Ask your health care provider which exercises are safe for you. Do exercises exactly as told by your health care provider and adjust them as directed. It is normal to feel mild stretching, pulling, tightness, or discomfort as you do these exercises, but you should stop right away if you feel sudden pain or your pain gets worse.Do not begin these exercises until told by your health care provider. STRETCHING AND RANGE OF MOTION EXERCISES These exercises warm up your muscles and joints and improve the movement and flexibility of your knee. These exercises also help to relieve pain, numbness, and tingling. Exercise A: Knee Extension, Prone 1. Lie on your abdomen on a bed. 2. Place your left / right knee just beyond the edge of the surface so your knee is not on the bed. You can put a towel under your left / right thigh just above your knee for comfort. 3. Relax your leg muscles and allow gravity to straighten your knee. You should feel a stretch behind your left / right knee. 4. Hold this position for __________ seconds. 5. Scoot up so your knee is supported between repetitions. Repeat __________ times. Complete this stretch __________ times a day. Exercise B: Knee Flexion, Active 1. Lie on your back with both knees straight. If this causes back discomfort, bend your left / right knee so your foot is flat on the floor. 2. Slowly slide your left / right heel back toward your buttocks until you feel a gentle stretch in the front of your knee or thigh. 3. Hold this position for __________ seconds. 4. Slowly slide your left / right heel back to the starting position. Repeat __________ times. Complete this exercise __________ times a day. Exercise C: Quadriceps, Prone 1. Lie on your abdomen on a firm  surface, such as a bed or padded floor. 2. Bend your left / right knee and hold your ankle. If you cannot reach your ankle or pant leg, loop a belt around your foot and grab the belt instead. 3. Gently pull your heel toward your buttocks. Your knee should not slide out to the side. You should feel a stretch in the front of your thigh and knee. 4. Hold this position for __________ seconds. Repeat __________ times. Complete this stretch __________ times a day. Exercise D: Hamstring, Supine 1. Lie on your back. 2. Loop a belt or towel over the ball of your left / right foot. The ball of your foot is on the walking surface, right under your toes. 3. Straighten your left / right knee and slowly pull on the belt to raise your leg until you feel  a gentle stretch behind your knee. ? Do not let your left / right knee bend while you do this. ? Keep your other leg flat on the floor. 4. Hold this position for __________ seconds. Repeat __________ times. Complete this stretch __________ times a day. STRENGTHENING EXERCISES These exercises build strength and endurance in your knee. Endurance is the ability to use your muscles for a long time, even after they get tired. Exercise E: Quadriceps, Isometric 1. Lie on your back with your left / right leg extended and your other knee bent. Put a rolled towel or small pillow under your knee if told by your health care provider. 2. Slowly tense the muscles in the front of your left / right thigh. You should see your kneecap slide up toward your hip or see increased dimpling just above the knee. This motion will push the back of the knee toward the floor. 3. For __________ seconds, keep the muscle as tight as you can without increasing your pain. 4. Relax the muscles slowly and completely. Repeat __________ times. Complete this exercise __________ times a day. Exercise F: Straight Leg Raises - Quadriceps 1. Lie on your back with your left / right leg extended and your  other knee bent. 2. Tense the muscles in the front of your left / right thigh. You should see your kneecap slide up or see increased dimpling just above the knee. Your thigh may even shake a bit. 3. Keep these muscles tight as you raise your leg 4-6 inches (10-15 cm) off the floor. Do not let your knee bend. 4. Hold this position for __________ seconds. 5. Keep these muscles tense as you lower your leg. 6. Relax your muscles slowly and completely after each repetition. Repeat __________ times. Complete this exercise __________ times a day. Exercise G: Hamstring, Isometric 1. Lie on your back on a firm surface. 2. Bend your left / right knee approximately __________ degrees. 3. Dig your left / right heel into the surface as if you are trying to pull it toward your buttocks. Tighten the muscles in the back of your thighs to dig as hard as you can without increasing any pain. 4. Hold this position for __________ seconds. 5. Release the tension gradually and allow your muscles to relax completely for __________ seconds after each repetition. Repeat __________ times. Complete this exercise __________ times a day. Exercise H: Hamstring Curls If told by your health care provider, do this exercise while wearing ankle weights. Begin with __________ weights. Then increase the weight by 1 lb (0.5 kg) increments. Do not wear ankle weights that are more than __________. 1. Lie on your abdomen with your legs straight. 2. Bend your left / right knee as far as you can without feeling pain. Keep your hips flat against the floor. 3. Hold this position for __________ seconds. 4. Slowly lower your leg to the starting position. Repeat __________ times. Complete this exercise __________ times a day. Exercise I: Squats (Quadriceps) 1. Stand in front of a table, with your feet and knees pointing straight ahead. You may rest your hands on the table for balance but not for support. 2. Slowly bend your knees and lower  your hips like you are going to sit in a chair. ? Keep your weight over your heels, not over your toes. ? Keep your lower legs upright so they are parallel with the table legs. ? Do not let your hips go lower than your knees. ? Do not bend lower  than told by your health care provider. ? If your knee pain increases, do not bend as low. 3. Hold the squat position for __________ seconds. 4. Slowly push with your legs to return to standing. Do not use your hands to pull yourself to standing. Repeat __________ times. Complete this exercise __________ times a day. Exercise J: Wall Slides (Quadriceps) 1. Lean your back against a smooth wall or door while you walk your feet out 18-24 inches (46-61 cm) from it. 2. Place your feet hip-width apart. 3. Slowly slide down the wall or door until your knees bend __________ degrees. Keep your knees over your heels, not over your toes. Keep your knees in line with your hips. 4. Hold for __________ seconds. Repeat __________ times. Complete this exercise __________ times a day. Exercise K: Straight Leg Raises - Hip Abductors 1. Lie on your side with your left / right leg in the top position. Lie so your head, shoulder, knee, and hip line up. You may bend your bottom knee to help you keep your balance. 2. Roll your hips slightly forward so your hips are stacked directly over each other and your left / right knee is facing forward. 3. Leading with your heel, lift your top leg 4-6 inches (10-15 cm). You should feel the muscles in your outer hip lifting. ? Do not let your foot drift forward. ? Do not let your knee roll toward the ceiling. 4. Hold this position for __________ seconds. 5. Slowly return your leg to the starting position. 6. Let your muscles relax completely after each repetition. Repeat __________ times. Complete this exercise __________ times a day. Exercise L: Straight Leg Raises - Hip Extensors 1. Lie on your abdomen on a firm surface. You can  put a pillow under your hips if that is more comfortable. 2. Tense the muscles in your buttocks and lift your left / right leg about 4-6 inches (10-15 cm). Keep your knee straight as you lift your leg. 3. Hold this position for __________ seconds. 4. Slowly lower your leg to the starting position. 5. Let your leg relax completely after each repetition. Repeat __________ times. Complete this exercise __________ times a day. This information is not intended to replace advice given to you by your health care provider. Make sure you discuss any questions you have with your health care provider. Document Released: 08/01/2005 Document Revised: 06/11/2016 Document Reviewed: 07/24/2015 Elsevier Interactive Patient Education  2019 [employer].

## 2018-09-22 NOTE — Telephone Encounter (Signed)
Patient seen in office today; pain is reproducible in knee and worry for blood clot is very low. Symmetric varicosities bilat legs.

## 2018-09-30 ENCOUNTER — Other Ambulatory Visit: Payer: Self-pay | Admitting: Adult Health

## 2018-09-30 MED ORDER — LIOTHYRONINE SODIUM 25 MCG PO TABS: 12.5000 ug | ORAL_TABLET | Freq: Every day | ORAL | 0 refills | Status: DC

## 2018-09-30 MED ORDER — LEVOTHYROXINE SODIUM 50 MCG PO TABS: 50.0000 ug | ORAL_TABLET | Freq: Every day | ORAL | 0 refills | Status: DC

## 2018-09-30 NOTE — Telephone Encounter (Signed)
TC to patient. Scheduled CPE with PCP for 11/20/18. Patient's insurance expires today and new insurance coverage effective 02/20. LOV 09/22/18. Last labs 08/28/17. Courtesy refill provided to bridge patient until appointment. Requested Prescriptions  Pending Prescriptions Disp Refills  . levothyroxine (SYNTHROID, LEVOTHROID) 50 MCG tablet 90 tablet 0    Sig: Take 1 tablet (50 mcg total) by mouth daily before breakfast.     Endocrinology:  Hypothyroid Agents Failed - 09/30/2018  9:54 AM      Failed - TSH needs to be rechecked within 3 months after an abnormal result. Refill until TSH is due.      Failed - TSH in normal range and within 360 days    TSH  Date Value Ref Range Status  08/28/2017 2.19 0.35 - 4.50 uIU/mL Final         Passed - Valid encounter within last 12 months    Recent Outpatient Visits          1 week ago Acute pain of left knee   [place], Steele Berg, MD   4 months ago Viral URI   [place] [facility], Amy E, MD   7 months ago [place], Brayton Mars, MD   1 year ago Routine general medical examination at a health care facility   [facility] [place], [place], NP   1 year ago Insect bite of left upper extremity, initial [facility] at [place], MD      Future Appointments            In 1 month Nafziger, Tommi Rumps, NP [facility] at [place], [facility]         . liothyronine (CYTOMEL) 25 MCG tablet 45 tablet 0    Sig: Take 0.5 tablets (12.5 mcg total) by mouth daily.     Endocrinology:  Hypothyroid Agents Failed - 09/30/2018  9:54 AM      Failed - TSH needs to be rechecked within 3 months after an abnormal result. Refill until TSH is due.      Failed - TSH in normal range and within 360 days    TSH  Date Value Ref Range Status  08/28/2017 2.19 0.35 - 4.50 uIU/mL Final         Passed - Valid encounter  within last 12 months    Recent Outpatient Visits          1 week ago Acute pain of left knee   [place] [facility], Steele Berg, MD   4 months ago Viral URI   [place] [facility], Amy E, MD   7 months ago [place], Brayton Mars, MD   1 year ago Routine general medical examination at a health care facility   [facility] [place], [place], NP   1 year ago Insect bite of left upper extremity, initial 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

## 2018-09-30 NOTE — Telephone Encounter (Signed)
Copied from [place] [id number]. Topic: Quick Communication - See Telephone Encounter >> Sep 30, 2018  9:21 AM Conception Chancy, NT wrote: CRM for notification. See Telephone encounter for: 09/30/18.  Patient is requesting a refill on liothyronine (CYTOMEL) 25 MCG tablet and levothyroxine (SYNTHROID, LEVOTHROID) 50 MCG tablet. She states they do not have insurance after today and would like for it to be done as soon as possible.  [facility] [place], [place] AT [facility] [address] [place] [address] Phone: [phone] Fax: [phone]

## 2018-11-20 ENCOUNTER — Encounter: Payer: BLUE CROSS/BLUE SHIELD | Admitting: Adult Health

## 2018-11-20 NOTE — Progress Notes (Deleted)
Subjective:    Patient ID: Shelby Espinoza, female    DOB: Aug 07, 1966, 53 y.o.   MRN: [id number]  HPI Patient presents for yearly preventative medicine examination. Pleasant 53 year old female who  has a past medical history of GERD (gastroesophageal reflux disease) and Hypothyroidism.   Hypothyroidism - takes Synthroid 50 mcg and cytomel 12.5 mg  Lab Results  Component Value Date   TSH 2.19 08/28/2017    All immunizations and health maintenance protocols were reviewed with the patient and needed orders were placed.  Appropriate screening laboratory values were ordered for the patient including screening of hyperlipidemia, renal function and hepatic function.   Medication reconciliation,  past medical history, social history, problem list and allergies were reviewed in detail with the patient  Goals were established with regard to weight loss, exercise, and  diet in compliance with medications  End of life planning was discussed.  She is up-to-date on dental and vision screens.  Her last colonoscopy was in 2017 and she is on the 10-year plan.  She is seen by GYN and is up-to-date on Pap and mammogram.   Review of Systems  Constitutional: Negative.   HENT: Negative.   Eyes: Negative.   Respiratory: Negative.   Cardiovascular: Negative.   Gastrointestinal: Negative.   Endocrine: Negative.   Genitourinary: Negative.   Musculoskeletal: Negative.   Skin: Negative.   Allergic/Immunologic: Negative.   Neurological: Negative.   Hematological: Negative.   Psychiatric/Behavioral: Negative.    Past Medical History:  Diagnosis Date  . GERD (gastroesophageal reflux disease)   . Hypothyroidism     Social History   Socioeconomic History  . Marital status: Married    Spouse name: Not on file  . Number of children: Not on file  . Years of education: Not on file  . Highest education level: Not on file  Occupational History  . Not on file  Social Needs  . Financial resource  strain: Not on file  . Food insecurity:    Worry: Not on file    Inability: Not on file  . Transportation needs:    Medical: Not on file    Non-medical: Not on file  Tobacco Use  . Smoking status: Never Smoker  . Smokeless tobacco: Never Used  Substance and Sexual Activity  . Alcohol use: No  . Drug use: No  . Sexual activity: Yes    Birth control/protection: None  Lifestyle  . Physical activity:    Days per week: Not on file    Minutes per session: Not on file  . Stress: Not on file  Relationships  . Social connections:    Talks on phone: Not on file    Gets together: Not on file    Attends religious service: Not on file    Active member of club or organization: Not on file    Attends meetings of clubs or organizations: Not on file    Relationship status: Not on file  . Intimate partner violence:    Fear of current or ex partner: Not on file    Emotionally abused: Not on file    Physically abused: Not on file    Forced sexual activity: Not on file  Other Topics Concern  . 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 APPENDECTOMY    . BREAST REDUCTION SURGERY    . CESAREAN SECTION    . TUBAL LIGATION      Family History  Problem Relation Age of Onset  . Alcohol abuse Father   . Throat cancer Father   . Heart disease Father   . Hypertension Father   . Breast cancer Mother   . Breast cancer Maternal Aunt   . Heart disease Paternal Aunt   . Colon cancer Neg Hx     No Known Allergies  Current Outpatient Medications on File Prior to Visit  Medication Sig Dispense Refill  . Elastic Bandages & Supports (ACE KNEE BRACE LARGE/X-LARGE) MISC 1 Device by Does not apply route daily. 1 each 0  . fluticasone (FLONASE) 50 MCG/ACT nasal spray Place into the nose.    .  levothyroxine (SYNTHROID, LEVOTHROID) 50 MCG tablet Take 1 tablet (50 mcg total) by mouth daily before breakfast. 90 tablet 0  . liothyronine (CYTOMEL) 25 MCG tablet Take 0.5 tablets (12.5 mcg total) by mouth daily. 45 tablet 0  . naproxen (NAPROSYN) 500 MG tablet Take 1 tablet (500 mg total) by mouth 2 (two) times daily with a meal. 30 tablet 0   Current Facility-Administered Medications on File Prior to Visit  Medication Dose Route Frequency Provider Last Rate Last Dose  . 0.9 %  sodium chloride infusion  500 mL Intravenous Continuous Nandigam, Kavitha V, MD        There were no vitals taken for this visit.      Objective:   Physical Exam Vitals signs and nursing note reviewed.  Constitutional:      General: She is not in acute distress.    Appearance: Normal appearance. She is well-developed and normal weight.  HENT:     Head: Normocephalic and atraumatic.     Right Ear: Tympanic membrane, ear canal and external ear normal. There is no impacted cerumen.     Left Ear: Tympanic membrane, ear canal and external ear normal. There is no impacted cerumen.     Nose: Nose normal. No congestion or rhinorrhea.     Mouth/Throat:     Mouth: Mucous membranes are moist.     Pharynx: Oropharynx is clear. No oropharyngeal exudate or posterior oropharyngeal erythema.  Eyes:     General:        Right eye: No discharge.        Left eye: No discharge.     Extraocular Movements: Extraocular movements intact.     Conjunctiva/sclera: Conjunctivae normal.     Pupils: Pupils are equal, round, and reactive to light.  Neck:     Musculoskeletal: Normal range of motion and neck supple. No neck rigidity or muscular tenderness.     Thyroid: No thyromegaly.     Vascular: No carotid bruit.     Trachea: No tracheal deviation.  Cardiovascular:     Rate and Rhythm: Normal rate and regular rhythm.     Heart sounds: Normal heart sounds. No murmur. No friction rub. No gallop.   Pulmonary:     Effort:  Pulmonary effort is normal. No respiratory distress.     Breath sounds: Normal breath sounds. No stridor. No wheezing, rhonchi or rales.  Chest:     Chest wall: No tenderness.  Abdominal:     General: Bowel sounds are normal. There is no distension.     Palpations: Abdomen is soft. There is  no mass.     Tenderness: There is no abdominal tenderness. There is no right CVA tenderness, left CVA tenderness, guarding or rebound.     Hernia: No hernia is present.  Musculoskeletal: Normal range of motion.        General: No swelling, tenderness, deformity or signs of injury.     Right lower leg: No edema.     Left lower leg: No edema.  Lymphadenopathy:     Cervical: No cervical adenopathy.  Skin:    General: Skin is warm and dry.     Coloration: Skin is not jaundiced or pale.     Findings: No bruising, erythema, lesion or rash.  Neurological:     General: No focal deficit present.     Mental Status: She is alert and oriented to person, place, and time.     Cranial Nerves: No cranial nerve deficit.     Coordination: Coordination normal.  Psychiatric:        Mood and Affect: Mood normal.        Behavior: Behavior normal.        Thought Content: Thought content normal.        Judgment: Judgment normal.           Assessment & Plan:

## 2018-12-29 ENCOUNTER — Other Ambulatory Visit: Payer: Self-pay | Admitting: Adult Health

## 2018-12-29 NOTE — Telephone Encounter (Signed)
Would you like to order lab work and do e-visit?  No cpx since 2018.

## 2018-12-30 ENCOUNTER — Other Ambulatory Visit: Payer: Self-pay | Admitting: Adult Health

## 2018-12-30 NOTE — Telephone Encounter (Signed)
[place] for lab work ( TSH) and [employer]

## 2018-12-30 NOTE — Telephone Encounter (Signed)
Please advise on refill request

## 2018-12-31 ENCOUNTER — Other Ambulatory Visit (INDEPENDENT_AMBULATORY_CARE_PROVIDER_SITE_OTHER): Payer: Self-pay

## 2018-12-31 ENCOUNTER — Other Ambulatory Visit: Payer: Self-pay

## 2018-12-31 ENCOUNTER — Other Ambulatory Visit: Payer: Self-pay | Admitting: Family Medicine

## 2018-12-31 DIAGNOSIS — E039 Hypothyroidism, unspecified: Secondary | ICD-10-CM

## 2018-12-31 LAB — TSH: TSH: 2.88 u[IU]/mL (ref 0.35–4.50)

## 2018-12-31 NOTE — Telephone Encounter (Signed)
Sent to the pharmacy by e-scribe. 

## 2019-01-01 ENCOUNTER — Ambulatory Visit: Payer: BLUE CROSS/BLUE SHIELD | Admitting: Adult Health

## 2019-01-22 ENCOUNTER — Ambulatory Visit: Payer: Self-pay | Admitting: Adult Health

## 2019-01-22 NOTE — Telephone Encounter (Signed)
Pt. Reports having body aches and headache for 2 days. Asking about testing for COVID 19. Instructed pt. We are currently not testing in the practices. Offered to assist her with a virtual visit. Refuses. Does not have health insurance at this time. Reviewed home care and self quarantine. Verbalizes understanding. Answer Assessment - Initial Assessment Questions 1. COVID-19 DIAGNOSIS: "Who made your Coronavirus (COVID-19) diagnosis?" "Was it confirmed by a positive lab test?" If not diagnosed by a HCP, ask "Are there lots of cases (community spread) where you live?" (See public health department website, if unsure)   * MAJOR community spread: high number of cases; numbers of cases are increasing; many people hospitalized.   * MINOR community spread: low number of cases; not increasing; few or no people hospitalized     No known exposure 2. ONSET: "When did the COVID-19 symptoms start?"      2 days ago 3. WORST SYMPTOM: "What is your worst symptom?" (e.g., cough, fever, shortness of breath, muscle aches)     Body aches 4. COUGH: "How bad is the cough?"       Mild 5. FEVER: "Do you have a fever?" If so, ask: "What is your temperature, how was it measured, and when did it start?"     No fever 6. RESPIRATORY STATUS: "Describe your breathing?" (e.g., shortness of breath, wheezing, unable to speak)      Slight shortness of breath 7. BETTER-SAME-WORSE: "Are you getting better, staying the same or getting worse compared to yesterday?"  If getting worse, ask, "In what way?"     Same 8. HIGH RISK DISEASE: "Do you have any chronic medical problems?" (e.g., asthma, heart or lung disease, weak immune system, etc.)     No 9. PREGNANCY: "Is there any chance you are pregnant?" "When was your last menstrual period?"     No 10. OTHER SYMPTOMS: "Do you have any other symptoms?"  (e.g., runny nose, headache, sore throat, loss of smell)       Headache  Protocols used: CORONAVIRUS (COVID-19) DIAGNOSED OR  SUSPECTED-A-AH

## 2019-03-26 ENCOUNTER — Other Ambulatory Visit: Payer: Self-pay | Admitting: Adult Health

## 2019-03-27 ENCOUNTER — Encounter: Payer: Self-pay | Admitting: Family Medicine

## 2019-03-27 NOTE — Telephone Encounter (Signed)
Sent to the pharmacy by e-scribe for 90 days.  Letter mailed to the pt.

## 2019-03-27 NOTE — Telephone Encounter (Signed)
Ok to refill for 90 days. Needs to get scheduled for CPE since she has not had one since 2018

## 2019-05-18 ENCOUNTER — Ambulatory Visit: Payer: Self-pay | Admitting: Family Medicine

## 2019-05-18 ENCOUNTER — Other Ambulatory Visit: Payer: Self-pay

## 2019-05-20 ENCOUNTER — Telehealth: Payer: Self-pay | Admitting: Adult Health

## 2019-06-19 ENCOUNTER — Encounter: Payer: Self-pay | Admitting: Adult Health

## 2019-06-19 ENCOUNTER — Ambulatory Visit (INDEPENDENT_AMBULATORY_CARE_PROVIDER_SITE_OTHER): Payer: Federal, State, Local not specified - PPO | Admitting: Adult Health

## 2019-06-19 ENCOUNTER — Other Ambulatory Visit: Payer: Self-pay

## 2019-06-19 VITALS — BP 122/78 | HR 74 | Temp 97.6°F | Wt 189.4 lb

## 2019-06-19 DIAGNOSIS — M778 Other enthesopathies, not elsewhere classified: Secondary | ICD-10-CM

## 2019-06-19 DIAGNOSIS — Z0000 Encounter for general adult medical examination without abnormal findings: Secondary | ICD-10-CM

## 2019-06-19 DIAGNOSIS — E039 Hypothyroidism, unspecified: Secondary | ICD-10-CM | POA: Diagnosis not present

## 2019-06-19 DIAGNOSIS — D492 Neoplasm of unspecified behavior of bone, soft tissue, and skin: Secondary | ICD-10-CM | POA: Diagnosis not present

## 2019-06-19 LAB — CBC WITH DIFFERENTIAL/PLATELET
Basophils Absolute: 0 10*3/uL (ref 0.0–0.1)
Basophils Relative: 0.9 % (ref 0.0–3.0)
Eosinophils Absolute: 0 10*3/uL (ref 0.0–0.7)
Eosinophils Relative: 0.6 % (ref 0.0–5.0)
HCT: 40 % (ref 36.0–46.0)
Hemoglobin: 12.7 g/dL (ref 12.0–15.0)
Lymphocytes Relative: 42.7 % (ref 12.0–46.0)
Lymphs Abs: 2 10*3/uL (ref 0.7–4.0)
MCHC: 31.8 g/dL (ref 30.0–36.0)
MCV: 72.4 fl — ABNORMAL LOW (ref 78.0–100.0)
Monocytes Absolute: 0.2 10*3/uL (ref 0.1–1.0)
Monocytes Relative: 5 % (ref 3.0–12.0)
Neutro Abs: 2.4 10*3/uL (ref 1.4–7.7)
Neutrophils Relative %: 50.8 % (ref 43.0–77.0)
Platelets: 230 10*3/uL (ref 150.0–400.0)
RBC: 5.52 Mil/uL — ABNORMAL HIGH (ref 3.87–5.11)
RDW: 18.8 % — ABNORMAL HIGH (ref 11.5–15.5)
WBC: 4.7 10*3/uL (ref 4.0–10.5)

## 2019-06-19 LAB — COMPREHENSIVE METABOLIC PANEL
ALT: 10 U/L (ref 0–35)
AST: 13 U/L (ref 0–37)
Albumin: 4.3 g/dL (ref 3.5–5.2)
Alkaline Phosphatase: 61 U/L (ref 39–117)
BUN: 12 mg/dL (ref 6–23)
CO2: 31 mEq/L (ref 19–32)
Calcium: 9.4 mg/dL (ref 8.4–10.5)
Chloride: 103 mEq/L (ref 96–112)
Creatinine, Ser: 1.02 mg/dL (ref 0.40–1.20)
GFR: 68.25 mL/min (ref >60.00)
Glucose, Bld: 88 mg/dL (ref 70–99)
Potassium: 4.5 mEq/L (ref 3.5–5.1)
Sodium: 142 mEq/L (ref 135–145)
Total Bilirubin: 1.1 mg/dL (ref 0.2–1.2)
Total Protein: 6.9 g/dL (ref 6.0–8.3)

## 2019-06-19 LAB — LIPID PANEL
Cholesterol: 147 mg/dL (ref 0–200)
HDL: 36.7 mg/dL — ABNORMAL LOW (ref >39.00)
LDL Cholesterol: 91 mg/dL (ref 0–99)
NonHDL: 110.34
Total CHOL/HDL Ratio: 4
Triglycerides: 95 mg/dL (ref 0.0–149.0)
VLDL: 19 mg/dL (ref 0.0–40.0)

## 2019-06-19 LAB — TSH: TSH: 3.93 u[IU]/mL (ref 0.35–4.50)

## 2019-06-19 MED ORDER — METHYLPREDNISOLONE 4 MG PO TBPK: ORAL_TABLET | ORAL | 0 refills | Status: DC

## 2019-06-19 NOTE — Progress Notes (Signed)
Subjective:    Patient ID: Shelby Espinoza, female    DOB: 08-Feb-1966, 53 y.o.   MRN: [id number]  HPI  Patient presents for yearly preventative medicine examination. She is a pleasant 53 year old female who  has a past medical history of GERD (gastroesophageal reflux disease) and Hypothyroidism.  Hypothyroidism -takes Synthroid 50 mcg daily and Cytomel 12.5 mg  Acute Issues   1.  Intermittent discomfort along the outside of her right elbow that is been happening for multiple months.  She reports sometimes sharp pain that radiates from her elbow down to her hand.  This only lasts a few seconds.  Reports that it is worse at night and with movement.  2.  Over the last year she has developed a mole on her right cheek that she believes is getting slightly larger.  She would like to be seen by dermatology for this  All immunizations and health maintenance protocols were reviewed with the patient and needed orders were placed. Refuses flu shot.   Appropriate screening laboratory values were ordered for the patient including screening of hyperlipidemia, renal function and hepatic function.  Medication reconciliation,  past medical history, social history, problem list and allergies were reviewed in detail with the patient  Goals were established with regard to weight loss, exercise, and  diet in compliance with medications  Wt Readings from Last 3 Encounters:  06/19/19 189 lb 6.4 oz (85.9 kg)  09/22/18 183 lb 9.6 oz (83.3 kg)  05/17/18 180 lb (81.6 kg)   She is up-to-date on screening colonoscopy and  mammogram.  Review of Systems  Constitutional: Negative.   HENT: Negative.   Eyes: Negative.   Respiratory: Negative.   Cardiovascular: Negative.   Gastrointestinal: Negative.   Endocrine: Negative.   Genitourinary: Negative.   Musculoskeletal: Positive for joint swelling and myalgias.  Skin: Negative.   Allergic/Immunologic: Negative.   Neurological: Negative.   Hematological:  Negative.   Psychiatric/Behavioral: Negative.    Past Medical History:  Diagnosis Date  . GERD (gastroesophageal reflux disease)   . Hypothyroidism     Social History   Socioeconomic History  . Marital status: Married    Spouse name: Not on file  . Number of children: Not on file  . Years of education: Not on file  . Highest education level: Not on file  Occupational History  . Not on file  Social Needs  . Financial resource strain: Not on file  . Food insecurity    Worry: Not on file    Inability: Not on file  . Transportation needs    Medical: Not on file    Non-medical: Not on file  Tobacco Use  . Smoking status: Never Smoker  . Smokeless tobacco: Never Used  Substance and Sexual Activity  . Alcohol use: No  . Drug use: No  . Sexual activity: Yes    Birth control/protection: None  Lifestyle  . Physical activity    Days per week: Not on file    Minutes per session: Not on file  . Stress: Not on file  Relationships  . Social Herbalist on phone: Not on file    Gets together: Not on file    Attends religious service: Not on file    Active member of club or organization: Not on file    Attends meetings of clubs or organizations: Not on file    Relationship status: Not on file  . Intimate partner violence  Fear of current or ex partner: Not on file    Emotionally abused: Not on file    Physically abused: Not on file    Forced sexual activity: Not on file  Other Topics Concern  . 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 APPENDECTOMY    . BREAST REDUCTION SURGERY    . CESAREAN SECTION    . TUBAL LIGATION      Family History  Problem Relation Age of Onset  . Alcohol abuse Father   . Throat cancer Father   . Heart disease Father   .  Hypertension Father   . Breast cancer Mother   . Breast cancer Maternal Aunt   . Heart disease Paternal Aunt   . Colon cancer Neg Hx     No Known Allergies  Current Outpatient Medications on File Prior to Visit  Medication Sig Dispense Refill  . Elastic Bandages & Supports (ACE KNEE BRACE LARGE/X-LARGE) MISC 1 Device by Does not apply route daily. 1 each 0  . fluticasone (FLONASE) 50 MCG/ACT nasal spray Place into the nose.    . levothyroxine (SYNTHROID) 50 MCG tablet TAKE 1 TABLET BY MOUTH EVERY DAY BEFORE BREAKFAST 90 tablet 0  . liothyronine (CYTOMEL) 25 MCG tablet TAKE 1/2 TABLET BY MOUTH EVERY DAY 45 tablet 0  . naproxen (NAPROSYN) 500 MG tablet Take 1 tablet (500 mg total) by mouth 2 (two) times daily with a meal. 30 tablet 0   No current facility-administered medications on file prior to visit.     BP 122/78 (BP Location: Left Arm, Patient Position: Sitting, Cuff Size: Normal)   Pulse 74   Temp 97.6 F (36.4 C) (Temporal)   Wt 189 lb 6.4 oz (85.9 kg)   SpO2 98%   BMI 33.02 kg/m       Objective:   Physical Exam Vitals signs and nursing note reviewed.  Constitutional:      General: She is not in acute distress.    Appearance: Normal appearance. She is normal weight. She is not diaphoretic.  HENT:     Head: Normocephalic and atraumatic.     Right Ear: Tympanic membrane, ear canal and external ear normal. There is no impacted cerumen.     Left Ear: Tympanic membrane, ear canal and external ear normal. There is no impacted cerumen.     Nose: Nose normal. No congestion or rhinorrhea.     Mouth/Throat:     Mouth: Mucous membranes are moist.     Pharynx: Oropharynx is clear. No oropharyngeal exudate or posterior oropharyngeal erythema.  Eyes:     General: No scleral icterus.       Right eye: No discharge.        Left eye: No discharge.     Conjunctiva/sclera: Conjunctivae normal.     Pupils: Pupils are equal, round, and reactive to light.  Neck:     Musculoskeletal:  Normal range of motion and neck supple.     Thyroid: No thyromegaly.     Vascular: No JVD.     Trachea: No tracheal deviation.  Cardiovascular:     Rate and Rhythm: Normal rate and regular rhythm.     Pulses: Normal pulses.  Heart sounds: Normal heart sounds. No murmur. No friction rub. No gallop.   Pulmonary:     Effort: Pulmonary effort is normal. No respiratory distress.     Breath sounds: Normal breath sounds. No stridor. No wheezing, rhonchi or rales.  Chest:     Chest wall: No tenderness.  Abdominal:     General: Abdomen is flat. Bowel sounds are normal. There is no distension.     Palpations: Abdomen is soft. There is no mass.     Tenderness: There is no abdominal tenderness. There is no right CVA tenderness, left CVA tenderness, guarding or rebound.     Hernia: No hernia is present.  Musculoskeletal: Normal range of motion.        General: Tenderness (She has tenderness along the lateral epicondyle.  No issues with range of motion) present. No swelling, deformity or signs of injury.     Right lower leg: No edema.     Left lower leg: No edema.  Lymphadenopathy:     Cervical: No cervical adenopathy.  Skin:    General: Skin is warm and dry.     Capillary Refill: Capillary refill takes less than 2 seconds.     Coloration: Skin is not jaundiced or pale.     Findings: No bruising, erythema, lesion or rash.     Comments: Occipitally a 1 cm nevi noted on right cheek.  Does not appear as carcinoma.  Neurological:     General: No focal deficit present.     Mental Status: She is alert and oriented to person, place, and time. Mental status is at baseline.     Cranial Nerves: No cranial nerve deficit.     Sensory: No sensory deficit.     Motor: No weakness or abnormal muscle tone.     Coordination: Coordination normal.     Gait: Gait normal.     Deep Tendon Reflexes: Reflexes normal.  Psychiatric:        Mood and Affect: Mood normal.        Behavior: Behavior normal.         Thought Content: Thought content normal.        Judgment: Judgment normal.       Assessment & Plan:  1. Routine general medical examination at a health care facility -Contuine to work on diet and exercise. -Follow-up in 1 year or sooner if needed - CBC with Differential/Platelet - Comprehensive metabolic panel - Lipid panel - TSH  2. Hypothyroidism, unspecified type -Consider dose increase of Synthroid - CBC with Differential/Platelet - Comprehensive metabolic panel - Lipid panel - TSH  3. Elbow tendonitis -Advised to rest and ice as much as possible.  Will send in Medrol Dosepak to see if this helps for her.  She was advised to follow-up if no improvement - methylPREDNISolone (MEDROL DOSEPAK) 4 MG TBPK tablet; Take as directed  Dispense: 21 tablet; Refill: 0  4. Skin neoplasm - Will refer to dermatology at patient requests - Ambulatory referral to Dermatology  Dorothyann Peng, NP

## 2019-06-19 NOTE — Patient Instructions (Addendum)
Dermatology will call you to schedule an appointment   Download the MyFitness Pal app to help you track your calories - you want to be between 1500-1700 to help lose weight   We will follow up with you regarding your blood work   I have sent in some prednisone for the tendonitis in your right elbow

## 2019-06-25 ENCOUNTER — Other Ambulatory Visit: Payer: Self-pay | Admitting: Adult Health

## 2019-06-26 NOTE — Telephone Encounter (Signed)
Sent to the pharmacy by e-scribe for 1 year. 

## 2019-07-09 DIAGNOSIS — L810 Postinflammatory hyperpigmentation: Secondary | ICD-10-CM | POA: Diagnosis not present

## 2019-07-09 DIAGNOSIS — L680 Hirsutism: Secondary | ICD-10-CM | POA: Diagnosis not present

## 2019-07-16 ENCOUNTER — Other Ambulatory Visit: Payer: Self-pay

## 2019-07-16 DIAGNOSIS — Z20828 Contact with and (suspected) exposure to other viral communicable diseases: Secondary | ICD-10-CM | POA: Diagnosis not present

## 2019-07-16 DIAGNOSIS — Z20822 Contact with and (suspected) exposure to covid-19: Secondary | ICD-10-CM

## 2019-07-17 LAB — NOVEL CORONAVIRUS, NAA: SARS-CoV-2, NAA: NOT DETECTED

## 2019-08-07 DIAGNOSIS — H2513 Age-related nuclear cataract, bilateral: Secondary | ICD-10-CM | POA: Diagnosis not present

## 2019-09-21 ENCOUNTER — Ambulatory Visit: Payer: BLUE CROSS/BLUE SHIELD | Admitting: Family Medicine

## 2019-11-30 DIAGNOSIS — Z1231 Encounter for screening mammogram for malignant neoplasm of breast: Secondary | ICD-10-CM | POA: Diagnosis not present

## 2019-11-30 LAB — HM MAMMOGRAPHY

## 2019-12-09 ENCOUNTER — Encounter: Payer: Self-pay | Admitting: Family Medicine

## 2019-12-28 ENCOUNTER — Other Ambulatory Visit: Payer: Self-pay

## 2019-12-29 ENCOUNTER — Ambulatory Visit (INDEPENDENT_AMBULATORY_CARE_PROVIDER_SITE_OTHER): Payer: Federal, State, Local not specified - PPO | Admitting: Adult Health

## 2019-12-29 ENCOUNTER — Encounter: Payer: Self-pay | Admitting: Adult Health

## 2019-12-29 VITALS — BP 122/80 | Temp 98.5°F | Wt 191.0 lb

## 2019-12-29 DIAGNOSIS — R5383 Other fatigue: Secondary | ICD-10-CM | POA: Diagnosis not present

## 2019-12-29 DIAGNOSIS — R635 Abnormal weight gain: Secondary | ICD-10-CM

## 2019-12-29 NOTE — Progress Notes (Signed)
Subjective:    Patient ID: Shelby Espinoza, female    DOB: March 08, 1966, 54 y.o.   MRN: [id number]  HPI 54 year old female who  has a past medical history of GERD (gastroesophageal reflux disease) and Hypothyroidism.  She presents to the office today for the complaint of fatigue and weight gain.  This has been an ongoing issue for her.  She does exercise at least 4 days a week and she is eating a heart healthy diet.  She takes Synthroid and Cytomel for hypothyroidism and this has been well controlled for many years.  She reports that when she wakes up she does not feel rested and feels fatigued throughout the day.  She does report that she snores and that her husband notices that she stops breathing when she is sleeping and he will have to nudge her to wake her up.  She denies CP or S OB  Wt Readings from Last 3 Encounters:  12/29/19 191 lb (86.6 kg)  06/19/19 189 lb 6.4 oz (85.9 kg)  09/22/18 183 lb 9.6 oz (83.3 kg)    Review of Systems See HPI   Past Medical History:  Diagnosis Date  . GERD (gastroesophageal reflux disease)   . Hypothyroidism     Social History   Socioeconomic History  . Marital status: Married    Spouse name: Not on file  . Number of children: Not on file  . Years of education: Not on file  . Highest education level: Not on file  Occupational History  . Not on file  Tobacco Use  . Smoking status: Never Smoker  . Smokeless tobacco: Never Used  Substance and Sexual Activity  . Alcohol use: No  . Drug use: No  . Sexual activity: Yes    Birth control/protection: None  Other Topics Concern  . 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 Difficulty of Paying Living Expenses:   Food Insecurity:   . Worried About Charity fundraiser in the Last  Year:   . Arboriculturist in the Last Year:   Transportation Needs:   . Film/video editor (Medical):   Marland Kitchen Lack of Transportation (Non-Medical):   Physical Activity:   . Days of Exercise per Week:   . Minutes of Exercise per Session:   Stress:   . Feeling of Stress :   Social Connections:   . Frequency of Communication with Friends and Family:   . Frequency of Social Gatherings with Friends and Family:   . Attends Religious Services:   . Active Member of Clubs or Organizations:   . Attends Archivist Meetings:   Marland Kitchen Marital Status:   Intimate Partner Violence:   . Fear of Current or Ex-Partner:   . Emotionally Abused:   Marland Kitchen Physically Abused:   . Sexually Abused:     Past Surgical History:  Procedure Laterality Date  . APPENDECTOMY    . BREAST REDUCTION SURGERY    . CESAREAN SECTION    . TUBAL LIGATION      Family History  Problem Relation Age of Onset  . Alcohol abuse Father   . Throat cancer Father   . Heart disease Father   .  Hypertension Father   . Breast cancer Mother   . Breast cancer Maternal Aunt   . Heart disease Paternal Aunt   . Colon cancer Neg Hx     No Known Allergies  Current Outpatient Medications on File Prior to Visit  Medication Sig Dispense Refill  . fluticasone (FLONASE) 50 MCG/ACT nasal spray Place into the nose.    . levothyroxine (SYNTHROID) 50 MCG tablet TAKE 1 TABLET BY MOUTH EVERY DAY BEFORE BREAKFAST 90 tablet 3  . liothyronine (CYTOMEL) 25 MCG tablet TAKE 1/2 TABLET BY MOUTH EVERY DAY. 45 tablet 3   No current facility-administered medications on file prior to visit.    BP 122/80   Temp 98.5 F (36.9 C) (Temporal)   Wt 191 lb (86.6 kg)   BMI 33.30 kg/m       Objective:   Physical Exam Vitals and nursing note reviewed.  Constitutional:      Appearance: Normal appearance.  Cardiovascular:     Rate and Rhythm: Normal rate and regular rhythm.     Pulses: Normal pulses.     Heart sounds: Normal heart sounds.    Pulmonary:     Effort: Pulmonary effort is normal.     Breath sounds: Normal breath sounds.  Musculoskeletal:        General: Normal range of motion.  Skin:    General: Skin is warm and dry.  Neurological:     General: No focal deficit present.     Mental Status: She is alert and oriented to person, place, and time.  Psychiatric:        Mood and Affect: Mood normal.        Behavior: Behavior normal.        Thought Content: Thought content normal.        Judgment: Judgment normal.       Assessment & Plan:  Her symptoms are consistent with sleep apnea.  Highly doubtful that her thyroid levels are abnormal.  Will refer to pulmonary for further evaluation

## 2020-01-28 ENCOUNTER — Encounter: Payer: Self-pay | Admitting: Pulmonary Disease

## 2020-01-28 ENCOUNTER — Ambulatory Visit (INDEPENDENT_AMBULATORY_CARE_PROVIDER_SITE_OTHER): Payer: Federal, State, Local not specified - PPO | Admitting: Pulmonary Disease

## 2020-01-28 VITALS — BP 120/78 | HR 71 | Temp 97.7°F | Ht 63.0 in | Wt 188.0 lb

## 2020-01-28 DIAGNOSIS — Z20828 Contact with and (suspected) exposure to other viral communicable diseases: Secondary | ICD-10-CM | POA: Diagnosis not present

## 2020-01-28 DIAGNOSIS — Z03818 Encounter for observation for suspected exposure to other biological agents ruled out: Secondary | ICD-10-CM | POA: Diagnosis not present

## 2020-01-28 DIAGNOSIS — G4733 Obstructive sleep apnea (adult) (pediatric): Secondary | ICD-10-CM | POA: Diagnosis not present

## 2020-01-28 NOTE — Progress Notes (Signed)
PERI LONGENECKER    [id number]    November 24, 1965  Primary Care Physician:Nafziger, Tommi Rumps, NP  Referring Physician: Dorothyann Peng, NP [place],  [place] [address]  Chief complaint:   Patient being seen for concern for obstructive sleep apnea  HPI:  History of snoring, witnessed apneas Longstanding history of snoring Usually goes to bed at 11 PM Falls asleep pretty quickly No awakening Final wake up time 6:30 AM Weight has been relatively stable with about 5 pound weight gain recently Admits to dryness No headaches No gasping respiration Mother snores Non-smoker  Admits to night sweats but relates this to being perimenopausal  History of hypothyroidism  Outpatient Encounter Medications as of 01/28/2020  Medication Sig  . cetirizine (ZYRTEC) 10 MG chewable tablet Chew 10 mg by mouth daily.  . fluticasone (FLONASE) 50 MCG/ACT nasal spray Place into the nose.  . levothyroxine (SYNTHROID) 50 MCG tablet TAKE 1 TABLET BY MOUTH EVERY DAY BEFORE BREAKFAST  . liothyronine (CYTOMEL) 25 MCG tablet TAKE 1/2 TABLET BY MOUTH EVERY DAY.   No facility-administered encounter medications on file as of 01/28/2020.    Allergies as of 01/28/2020  . (No Known Allergies)    Past Medical History:  Diagnosis Date  . GERD (gastroesophageal reflux disease)   . Hypothyroidism     Past Surgical History:  Procedure Laterality Date  . APPENDECTOMY    . BREAST REDUCTION SURGERY    . CESAREAN SECTION    . TUBAL LIGATION      Family History  Problem Relation Age of Onset  . Alcohol abuse Father   . Throat cancer Father   . Heart disease Father   . Hypertension Father   . Breast cancer Mother   . Breast cancer Maternal Aunt   . Heart disease Paternal Aunt   . Colon cancer Neg Hx     Social History   Socioeconomic History  . Marital status: Married    Spouse name: Not on file  . Number of children: Not on file  . Years of education: Not on file  .  Highest education level: Not on file  Occupational History  . Not on file  Tobacco Use  . Smoking status: Never Smoker  . Smokeless tobacco: Never Used  Substance and Sexual Activity  . Alcohol use: No  . Drug use: No  . Sexual activity: Yes    Birth control/protection: None  Other Topics Concern  . 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 Difficulty of Paying Living Expenses:   Food Insecurity:   . Worried About Charity fundraiser in the Last Year:   . Arboriculturist in the Last Year:   Transportation Needs:   . Film/video editor (Medical):   Marland Kitchen Lack of Transportation (Non-Medical):   Physical Activity:   . Days of Exercise per Week:   . Minutes of Exercise per Session:   Stress:   . Feeling of Stress :   Social Connections:   . Frequency of Communication with Friends and Family:   . Frequency of Social Gatherings with Friends and Family:   . Attends Religious Services:   .  Active Member of Clubs or Organizations:   . Attends Archivist Meetings:   Marland Kitchen Marital Status:   Intimate Partner Violence:   . Fear of Current or Ex-Partner:   . Emotionally Abused:   Marland Kitchen Physically Abused:   . Sexually Abused:     Review of Systems  HENT: Negative.   Respiratory: Negative.   Cardiovascular: Negative.   Gastrointestinal: Negative.   Psychiatric/Behavioral: Positive for sleep disturbance.    Vitals:   01/28/20 1638  BP: 120/78  Pulse: 71  Temp: 97.7 F (36.5 C)  SpO2: 99%     Physical Exam  Constitutional: She appears well-developed and well-nourished.  Obese  HENT:  Head: Normocephalic and atraumatic.  Crowded oropharynx, macroglossia, Mallampati 4  Eyes: Pupils are equal, round, and reactive to light. Right eye exhibits no discharge.  Left eye exhibits no discharge.  Neck: No tracheal deviation present. No thyromegaly present.  Cardiovascular: Normal rate and regular rhythm.  Pulmonary/Chest: No respiratory distress. She has no wheezes. She has no rales. She exhibits no tenderness.  Musculoskeletal:     Cervical back: Normal range of motion and neck supple.  Neurological: She is alert.  Skin: Skin is warm.  Psychiatric: She has a normal mood and affect.   Results of the Epworth flowsheet 01/28/2020  Sitting and reading 0  Watching TV 3  Sitting, inactive in a public place (e.g. a theatre or a meeting) 0  As a passenger in a car for an hour without a break 3  Lying down to rest in the afternoon when circumstances permit 2  Sitting and talking to someone 0  Sitting quietly after a lunch without alcohol 0  In a car, while stopped for a few minutes in traffic 0  Total score 8   Assessment:  Moderate probability of significant obstructive sleep apnea  Obesity  Excessive daytime sleepiness  Pathophysiology of sleep disordered breathing discussed with patient Treatment options for sleep disordered breathing discussed with the patient  Plan/Recommendations: We will schedule the patient for home sleep study  Risk with untreated obstructive sleep apnea discussed with the patient  Encouraged weight loss efforts  Follow-up in 3 months   Sherrilyn Rist MD [facility] 01/28/2020, 8:52 PM  CC: Dorothyann Peng, NP

## 2020-01-28 NOTE — Patient Instructions (Signed)
Moderate probability of significant obstructive sleep apnea  We will schedule you for home sleep study Update your results  Treatment options as discussed  We will follow-up with you in about 2 to 3 months  Continue weight loss efforts Sleep Apnea Sleep apnea is a condition in which breathing pauses or becomes shallow during sleep. Episodes of sleep apnea usually last 10 seconds or longer, and they may occur as many as 20 times an hour. Sleep apnea disrupts your sleep and keeps your body from getting the rest that it needs. This condition can increase your risk of certain health problems, including:  Heart attack.  Stroke.  Obesity.  Diabetes.  Heart failure.  Irregular heartbeat. What are the causes? There are three kinds of sleep apnea:  Obstructive sleep apnea. This kind is caused by a blocked or collapsed airway.  Central sleep apnea. This kind happens when the part of the brain that controls breathing does not send the correct signals to the muscles that control breathing.  Mixed sleep apnea. This is a combination of obstructive and central sleep apnea. The most common cause of this condition is a collapsed or blocked airway. An airway can collapse or become blocked if:  Your throat muscles are abnormally relaxed.  Your tongue and tonsils are larger than normal.  You are overweight.  Your airway is smaller than normal. What increases the risk? You are more likely to develop this condition if you:  Are overweight.  Smoke.  Have a smaller than normal airway.  Are elderly.  Are female.  Drink alcohol.  Take sedatives or tranquilizers.  Have a family history of sleep apnea. What are the signs or symptoms? Symptoms of this condition include:  Trouble staying asleep.  Daytime sleepiness and tiredness.  Irritability.  Loud snoring.  Morning headaches.  Trouble concentrating.  Forgetfulness.  Decreased interest in sex.  Unexplained  sleepiness.  Mood swings.  Personality changes.  Feelings of depression.  Waking up often during the night to urinate.  Dry mouth.  Sore throat. How is this diagnosed? This condition may be diagnosed with:  A medical history.  A physical exam.  A series of tests that are done while you are sleeping (sleep study). These tests are usually done in a sleep lab, but they may also be done at home. How is this treated? Treatment for this condition aims to restore normal breathing and to ease symptoms during sleep. It may involve managing health issues that can affect breathing, such as high blood pressure or obesity. Treatment may include:  Sleeping on your side.  Using a decongestant if you have nasal congestion.  Avoiding the use of depressants, including alcohol, sedatives, and narcotics.  Losing weight if you are overweight.  Making changes to your diet.  Quitting smoking.  Using a device to open your airway while you sleep, such as: ? An oral appliance. This is a custom-made mouthpiece that shifts your lower jaw forward. ? A continuous positive airway pressure (CPAP) device. This device blows air through a mask when you breathe out (exhale). ? A nasal expiratory positive airway pressure (EPAP) device. This device has valves that you put into each nostril. ? A bi-level positive airway pressure (BPAP) device. This device blows air through a mask when you breathe in (inhale) and breathe out (exhale).  Having surgery if other treatments do not work. During surgery, excess tissue is removed to create a wider airway. It is important to get treatment for sleep apnea. Without  treatment, this condition can lead to:  High blood pressure.  Coronary artery disease.  In men, an inability to achieve or maintain an erection (impotence).  Reduced thinking abilities. Follow these instructions at home: Lifestyle  Make any lifestyle changes that your health care provider  recommends.  Eat a healthy, well-balanced diet.  Take steps to lose weight if you are overweight.  Avoid using depressants, including alcohol, sedatives, and narcotics.  Do not use any products that contain nicotine or tobacco, such as cigarettes, e-cigarettes, and chewing tobacco. If you need help quitting, ask your health care provider. General instructions  Take over-the-counter and prescription medicines only as told by your health care provider.  If you were given a device to open your airway while you sleep, use it only as told by your health care provider.  If you are having surgery, make sure to tell your health care provider you have sleep apnea. You may need to bring your device with you.  Keep all follow-up visits as told by your health care provider. This is important. Contact a health care provider if:  The device that you received to open your airway during sleep is uncomfortable or does not seem to be working.  Your symptoms do not improve.  Your symptoms get worse. Get help right away if:  You develop: ? Chest pain. ? Shortness of breath. ? Discomfort in your back, arms, or stomach.  You have: ? Trouble speaking. ? Weakness on one side of your body. ? Drooping in your face. These symptoms may represent a serious problem that is an emergency. Do not wait to see if the symptoms will go away. Get medical help right away. Call your local emergency services (911 in the [place] Do not drive yourself to the hospital. Summary  Sleep apnea is a condition in which breathing pauses or becomes shallow during sleep.  The most common cause is a collapsed or blocked airway.  The goal of treatment is to restore normal breathing and to ease symptoms during sleep. This information is not intended to replace advice given to you by your health care provider. Make sure you discuss any questions you have with your health care provider. Document Revised: 03/04/2019 Document  Reviewed: 05/13/2018 Elsevier Patient Education  [place].

## 2020-02-24 ENCOUNTER — Other Ambulatory Visit: Payer: Self-pay

## 2020-02-24 ENCOUNTER — Ambulatory Visit: Payer: Federal, State, Local not specified - PPO

## 2020-02-24 DIAGNOSIS — G4733 Obstructive sleep apnea (adult) (pediatric): Secondary | ICD-10-CM | POA: Diagnosis not present

## 2020-03-01 ENCOUNTER — Telehealth: Payer: Self-pay | Admitting: Pulmonary Disease

## 2020-03-01 DIAGNOSIS — R0683 Snoring: Secondary | ICD-10-CM | POA: Diagnosis not present

## 2020-03-01 NOTE — Telephone Encounter (Signed)
Call patient  Sleep study result  Date of study: 02/25/2020  Impression: Negative study for significant sleep disordered breathing  Recommendation: Sleep position optimization by encouraging sleeping in a lateral position, elevating head of bed by about 30 degrees may help noted snoring  Encouraged weight loss and exercise  Caution against sleepy driving  Follow-up in 4 to 6 weeks

## 2020-03-02 NOTE — Telephone Encounter (Signed)
Patient is returning phone call. Patient phone number is [phone].

## 2020-03-02 NOTE — Telephone Encounter (Signed)
Called and spoke with patient. She verbalized understanding of results. She stated that she is already sleeping with the head of her bed elevated and this seems to work with her breathing at night.   She has been scheduled for a follow up on 04/25/20 at 9am. She is aware to call us if anything changes with her breathing before then.   Nothing further needed at time of call.

## 2020-03-02 NOTE — Telephone Encounter (Signed)
Called and left message for patient to return call.  

## 2020-03-15 ENCOUNTER — Ambulatory Visit: Payer: Federal, State, Local not specified - PPO | Attending: Critical Care Medicine

## 2020-03-15 DIAGNOSIS — Z23 Encounter for immunization: Secondary | ICD-10-CM

## 2020-03-15 NOTE — Progress Notes (Signed)
   Covid-19 Vaccination Clinic  Name:  Shelby Espinoza    MRN: [id number] DOB: [birth year]  03/15/2020  Shelby Espinoza was observed post Covid-19 immunization for 15 minutes without incident. She was provided with Vaccine Information Sheet and instruction to access the V-Safe system.   Shelby Espinoza was instructed to call 911 with any severe reactions post vaccine: Marland Kitchen Difficulty breathing  . Swelling of face and throat  . A fast heartbeat  . A bad rash all over body  . Dizziness and weakness   Immunizations Administered    Name Date Dose VIS Date Route   Moderna COVID-19 Vaccine 03/15/2020  5:32 PM 0.5 mL 09/2019 Intramuscular   Manufacturer: Levan Hurst   Lot: [id number]   [place]: 24818-590-93

## 2020-04-16 ENCOUNTER — Ambulatory Visit: Payer: Federal, State, Local not specified - PPO | Attending: Internal Medicine

## 2020-04-16 ENCOUNTER — Ambulatory Visit: Payer: Federal, State, Local not specified - PPO

## 2020-04-16 DIAGNOSIS — Z23 Encounter for immunization: Secondary | ICD-10-CM

## 2020-04-16 NOTE — Progress Notes (Signed)
   Covid-19 Vaccination Clinic  Name:  Shelby Espinoza    MRN: [id number] DOB: [birth year]  04/16/2020  Ms. Shackett was observed post Covid-19 immunization for 15 minutes without incident. She was provided with Vaccine Information Sheet and instruction to access the V-Safe system.   Ms. Birdsell was instructed to call 911 with any severe reactions post vaccine: Marland Kitchen Difficulty breathing  . Swelling of face and throat  . A fast heartbeat  . A bad rash all over body  . Dizziness and weakness   Immunizations Administered    Name Date Dose VIS Date Route   Moderna COVID-19 Vaccine 04/16/2020 11:08 AM 0.5 mL 09/2019 Intramuscular   Manufacturer: Moderna   Lot: [id number]   [place]: [id number]

## 2020-04-25 ENCOUNTER — Ambulatory Visit: Payer: Federal, State, Local not specified - PPO | Admitting: Pulmonary Disease

## 2020-05-23 DIAGNOSIS — G514 Facial myokymia: Secondary | ICD-10-CM | POA: Diagnosis not present

## 2020-05-23 DIAGNOSIS — H2513 Age-related nuclear cataract, bilateral: Secondary | ICD-10-CM | POA: Diagnosis not present

## 2020-05-23 DIAGNOSIS — G245 Blepharospasm: Secondary | ICD-10-CM | POA: Diagnosis not present

## 2020-05-31 ENCOUNTER — Ambulatory Visit: Payer: Federal, State, Local not specified - PPO | Admitting: Adult Health

## 2020-06-08 ENCOUNTER — Other Ambulatory Visit: Payer: Self-pay | Admitting: Adult Health

## 2020-06-10 ENCOUNTER — Telehealth (INDEPENDENT_AMBULATORY_CARE_PROVIDER_SITE_OTHER): Payer: Federal, State, Local not specified - PPO | Admitting: Family Medicine

## 2020-06-10 ENCOUNTER — Encounter: Payer: Self-pay | Admitting: Family Medicine

## 2020-06-10 ENCOUNTER — Other Ambulatory Visit: Payer: Self-pay

## 2020-06-10 DIAGNOSIS — J069 Acute upper respiratory infection, unspecified: Secondary | ICD-10-CM

## 2020-06-10 NOTE — Progress Notes (Signed)
Subjective:    Patient ID: Shelby Espinoza, female    DOB: 11-Jan-1966, 54 y.o.   MRN: [id number]  HPI Virtual Visit via Video Note  I connected with the patient on 06/10/20 at  3:15 PM EDT by a video enabled telemedicine application and verified that I am speaking with the correct person using two identifiers.  Location patient: home Location provider:work or home office Persons participating in the virtual visit: patient, provider  I discussed the limitations of evaluation and management by telemedicine and the availability of in person appointments. The patient expressed understanding and agreed to proceed.   HPI: Here for 2 days of stuffy head, PND, a hoarse voice, sneezing, and a dry cough. No headache or fever. No body aches. No ST or cough or SOB. No NVD. She is drinking fluids. She also mentions having intermittent twitching of the right eye for the past 2 months. She saw her eye doctor and she had a normal exam. He told her it was from stress and from spending too much time in front of a computer. She works for [employer], and she has been working 12 hour days for several months, often 7 days in a row.    ROS: See pertinent positives and negatives per HPI.  Past Medical History:  Diagnosis Date  . GERD (gastroesophageal reflux disease)   . Hypothyroidism     Past Surgical History:  Procedure Laterality Date  . APPENDECTOMY    . BREAST REDUCTION SURGERY    . CESAREAN SECTION    . TUBAL LIGATION      Family History  Problem Relation Age of Onset  . Alcohol abuse Father   . Throat cancer Father   . Heart disease Father   . Hypertension Father   . Breast cancer Mother   . Breast cancer Maternal Aunt   . Heart disease Paternal Aunt   . Colon cancer Neg Hx      Current Outpatient Medications:  .  cetirizine (ZYRTEC) 10 MG chewable tablet, Chew 10 mg by mouth daily., Disp: , Rfl:  .  fluticasone (FLONASE) 50 MCG/ACT nasal spray, Place into the nose., Disp: , Rfl:  .   levothyroxine (SYNTHROID) 50 MCG tablet, TAKE 1 TABLET BY MOUTH EVERY DAY BEFORE BREAKFAST, Disp: 90 tablet, Rfl: 0 .  liothyronine (CYTOMEL) 25 MCG tablet, TAKE 1/2 TABLET BY MOUTH EVERY DAY, Disp: 45 tablet, Rfl: 0  EXAM:  VITALS per patient if applicable:  GENERAL: alert, oriented, appears well and in no acute distress  HEENT: atraumatic, conjunttiva clear, no obvious abnormalities on inspection of external nose and ears  NECK: normal movements of the head and neck  LUNGS: on inspection no signs of respiratory distress, breathing rate appears normal, no obvious gross SOB, gasping or wheezing  CV: no obvious cyanosis  MS: moves all visible extremities without noticeable abnormality  PSYCH/NEURO: pleasant and cooperative, no obvious depression or anxiety, speech and thought processing grossly intact  ASSESSMENT AND PLAN: She has several issues going on. First I do think her eye twitching is from stress and not getting enough rest. We will write her out of work this weekend to give her a chance to get some rest. Also she has a viral URI. She will drink fluids and rest. I advised her to get a Covid test, and she agreed.  Alysia Penna, MD  Discussed the following assessment and plan:  No diagnosis found.     I discussed the assessment and treatment plan with  the patient. The patient was provided an opportunity to ask questions and all were answered. The patient agreed with the plan and demonstrated an understanding of the instructions.   The patient was advised to call back or seek an in-person evaluation if the symptoms worsen or if the condition fails to improve as anticipated.     Review of Systems     Objective:   Physical Exam        Assessment & Plan:

## 2020-08-22 ENCOUNTER — Other Ambulatory Visit: Payer: Federal, State, Local not specified - PPO

## 2020-09-02 ENCOUNTER — Other Ambulatory Visit: Payer: Self-pay | Admitting: Adult Health

## 2020-10-01 HISTORY — PX: COLONOSCOPY: SHX174

## 2020-10-20 ENCOUNTER — Ambulatory Visit: Payer: Federal, State, Local not specified - PPO | Attending: Internal Medicine

## 2020-10-20 DIAGNOSIS — Z6835 Body mass index (BMI) 35.0-35.9, adult: Secondary | ICD-10-CM | POA: Diagnosis not present

## 2020-10-20 DIAGNOSIS — Z01419 Encounter for gynecological examination (general) (routine) without abnormal findings: Secondary | ICD-10-CM | POA: Diagnosis not present

## 2020-10-20 DIAGNOSIS — Z124 Encounter for screening for malignant neoplasm of cervix: Secondary | ICD-10-CM | POA: Diagnosis not present

## 2020-10-20 DIAGNOSIS — Z23 Encounter for immunization: Secondary | ICD-10-CM

## 2020-10-20 LAB — RESULTS CONSOLE HPV: CHL HPV: NEGATIVE

## 2020-10-20 LAB — HM PAP SMEAR

## 2020-10-20 NOTE — Progress Notes (Signed)
   Covid-19 Vaccination Clinic  Name:  Shelby Espinoza    MRN: [id number] DOB: [birth year]  10/20/2020  Ms. Dehaas was observed post Covid-19 immunization for 15 minutes without incident. She was provided with Vaccine Information Sheet and instruction to access the V-Safe system.   Ms. Campton was instructed to call 911 with any severe reactions post vaccine: Marland Kitchen Difficulty breathing  . Swelling of face and throat  . A fast heartbeat  . A bad rash all over body  . Dizziness and weakness   Immunizations Administered    Name Date Dose VIS Date Route   Moderna Covid-19 Booster Vaccine 10/20/2020  1:42 PM 0.25 mL 07/20/2020 Intramuscular   Manufacturer: Moderna   Lot: [id number]   [place]: [id number]

## 2020-11-14 ENCOUNTER — Telehealth: Payer: Self-pay | Admitting: Adult Health

## 2020-11-14 NOTE — Telephone Encounter (Signed)
Pt requesting a tropical cream  For ingrown hair on her chain   vaniqa cream Called to her  [facility]  [address], [place], [place] [address]

## 2020-11-15 NOTE — Telephone Encounter (Signed)
Spoke to the pt.  She is requesting Vaniqa cream for her hair growth on her chin.  Due to tweezing she gets many ingrown hairs.  I have scheduled her for a virtual appointment to discuss with [facility].  Nothing further needed.

## 2020-11-15 NOTE — Telephone Encounter (Signed)
We have not prescribed this in the past. Will need visit

## 2020-11-16 ENCOUNTER — Other Ambulatory Visit: Payer: Self-pay

## 2020-11-16 ENCOUNTER — Encounter: Payer: Self-pay | Admitting: Adult Health

## 2020-11-16 ENCOUNTER — Telehealth (INDEPENDENT_AMBULATORY_CARE_PROVIDER_SITE_OTHER): Payer: Federal, State, Local not specified - PPO | Admitting: Adult Health

## 2020-11-16 VITALS — Temp 97.5°F | Wt 188.0 lb

## 2020-11-16 DIAGNOSIS — L680 Hirsutism: Secondary | ICD-10-CM

## 2020-11-16 MED ORDER — EFLORNITHINE HCL 13.9 % EX CREA: TOPICAL_CREAM | CUTANEOUS | 3 refills | Status: DC

## 2020-11-16 NOTE — Progress Notes (Signed)
Virtual Visit via Telephone Note  I connected with Chrisandra Carota on 11/16/20 at 10:30 AM EST by telephone and verified that I am speaking with the correct person using two identifiers.   I discussed the limitations, risks, security and privacy concerns of performing an evaluation and management service by telephone and the availability of in person appointments. I also discussed with the patient that there may be a patient responsible charge related to this service. The patient expressed understanding and agreed to proceed.  Location patient: home Location provider: work or home office Participants present for the call: patient, provider Patient did not have a visit in the prior 7 days to address this/these issue(s).   History of Present Illness: 55 year old female who is being evaluated today for hirsutism.  She has tried various modalities including plucking and laser therapy but skin did not respond well to these therapies.  She would like to try cream.  Does not want to try oral medication   Observations/Objective: Patient sounds cheerful and well on the phone. I do not appreciate any SOB. Speech and thought processing are grossly intact. Patient reported vitals:  Assessment and Plan: 1. Hirsutism -Discussed side effects of hair removal cream.  Advised to start off applying cream once daily for 2 weeks then if she can tolerate twice daily dosing she can.  Follow-up as needed - Eflornithine HCl 13.9 % cream; Apply thin layer to affected areas of face and under the chin twice daily  Dispense: 45 g; Refill: 3   Follow Up Instructions:  @   02725 5-10 [id number] 11-20 9443 21-30 I did not refer this patient for an OV in the next 24 hours for this/these issue(s).  I discussed the assessment and treatment plan with the patient. The patient was provided an opportunity to ask questions and all were answered. The patient agreed with the plan and demonstrated an understanding of the  instructions.   The patient was advised to call back or seek an in-person evaluation if the symptoms worsen or if the condition fails to improve as anticipated.  I provided 15 minutes of non-face-to-face time during this encounter.   Dorothyann Peng, NP

## 2020-12-05 DIAGNOSIS — Z1231 Encounter for screening mammogram for malignant neoplasm of breast: Secondary | ICD-10-CM | POA: Diagnosis not present

## 2020-12-05 LAB — HM MAMMOGRAPHY

## 2020-12-21 ENCOUNTER — Encounter: Payer: Self-pay | Admitting: Adult Health

## 2021-01-24 ENCOUNTER — Other Ambulatory Visit: Payer: Self-pay | Admitting: Adult Health

## 2021-01-24 MED ORDER — LIOTHYRONINE SODIUM 25 MCG PO TABS
12.5000 ug | ORAL_TABLET | Freq: Every day | ORAL | 0 refills | Status: DC
Start: 2021-01-24 — End: 2021-03-23

## 2021-01-24 MED ORDER — LEVOTHYROXINE SODIUM 50 MCG PO TABS
ORAL_TABLET | ORAL | 0 refills | Status: DC
Start: 2021-01-24 — End: 2021-02-23

## 2021-01-24 NOTE — Telephone Encounter (Signed)
Patient needs a refill on her Levothyroxine and Cytomel.  Ph[facility]acy[facility]  **Please switch pharmacy in her chart to [place].

## 2021-01-24 NOTE — Telephone Encounter (Signed)
VV 11/16/20 TSH 06/19/19 Okay to refill?

## 2021-02-20 ENCOUNTER — Other Ambulatory Visit: Payer: Self-pay | Admitting: Adult Health

## 2021-02-22 NOTE — Telephone Encounter (Signed)
Patient need to schedule an ov for more refills. 

## 2021-02-23 ENCOUNTER — Telehealth: Payer: Self-pay | Admitting: Adult Health

## 2021-02-23 MED ORDER — LEVOTHYROXINE SODIUM 50 MCG PO TABS: ORAL_TABLET | ORAL | 0 refills | Status: DC

## 2021-02-23 NOTE — Telephone Encounter (Signed)
Pt last ov 11/2019 last TSH lab 11/2018 pt LOV 2021. Pt stated she has been taking her Tyroid medication and only missed it today bc she ran out. Per PCP pt can have 30 days since no other dose was missed. Pt notified of update. No further action needed. Rx sent to pharmacy for 30 days.

## 2021-02-23 NOTE — Telephone Encounter (Signed)
Pt call and stated she is out of levothyroxine (SYNTHROID) 50 MCG tablet and need a refill [facility]  [id number] [place] [place] - [place] Phone:  [phone]  Fax:  [phone]

## 2021-02-24 ENCOUNTER — Other Ambulatory Visit: Payer: Self-pay | Admitting: Adult Health

## 2021-02-24 NOTE — Telephone Encounter (Signed)
[facility] [address] and would like to have it updated in her chart.  Pt is calling in stating that the has had a change in pharmacy [facility] and she is at the pharmacy now waiting on her Rx levothyroxine (SYNTHROID) 50 MCG pt stated that she has not had this medication for two days.

## 2021-02-28 ENCOUNTER — Ambulatory Visit (INDEPENDENT_AMBULATORY_CARE_PROVIDER_SITE_OTHER): Payer: Federal, State, Local not specified - PPO | Admitting: Adult Health

## 2021-02-28 ENCOUNTER — Other Ambulatory Visit: Payer: Self-pay

## 2021-02-28 ENCOUNTER — Encounter: Payer: Self-pay | Admitting: Adult Health

## 2021-02-28 VITALS — BP 128/80 | HR 64 | Temp 97.9°F | Ht 63.75 in | Wt 187.0 lb

## 2021-02-28 DIAGNOSIS — Z0000 Encounter for general adult medical examination without abnormal findings: Secondary | ICD-10-CM

## 2021-02-28 DIAGNOSIS — Z1159 Encounter for screening for other viral diseases: Secondary | ICD-10-CM | POA: Diagnosis not present

## 2021-02-28 DIAGNOSIS — E039 Hypothyroidism, unspecified: Secondary | ICD-10-CM | POA: Diagnosis not present

## 2021-02-28 DIAGNOSIS — Z23 Encounter for immunization: Secondary | ICD-10-CM

## 2021-02-28 NOTE — Progress Notes (Signed)
Subjective:    Patient ID: Shelby Espinoza, female    DOB: 18-Jul-1966, 55 y.o.   MRN: [id number]  HPI Patient presents for yearly preventative medicine examination. She is a pleasant 55 year old female who  has a past medical history of GERD (gastroesophageal reflux disease) and Hypothyroidism.  Hypothyroidism - takes synthroid 50 mcg daily and cytomel 12.5 mg daily. She has been controlled on this medication  Lab Results  Component Value Date   TSH 3.93 06/19/2019   All immunizations and health maintenance protocols were reviewed with the patient and needed orders were placed.  Appropriate screening laboratory values were ordered for the patient including screening of hyperlipidemia, renal function and hepatic function.  Medication reconciliation,  past medical history, social history, problem list and allergies were reviewed in detail with the patient  Goals were established with regard to weight loss, exercise, and  diet in compliance with medications   Review of Systems  Constitutional: Negative.   HENT: Negative.   Eyes: Negative.   Respiratory: Negative.   Cardiovascular: Negative.   Gastrointestinal: Negative.   Endocrine: Negative.   Genitourinary: Negative.   Musculoskeletal: Negative.   Skin: Negative.   Allergic/Immunologic: Negative.   Neurological: Negative.   Hematological: Negative.   Psychiatric/Behavioral: Negative.    Past Medical History:  Diagnosis Date  . GERD (gastroesophageal reflux disease)   . Hypothyroidism     Social History   Socioeconomic History  . Marital status: Married    Spouse name: Not on file  . Number of children: Not on file  . Years of education: Not on file  . Highest education level: Not on file  Occupational History  . Not on file  Tobacco Use  . Smoking status: Never Smoker  . Smokeless tobacco: Never Used  Substance and Sexual Activity  . Alcohol use: No  . Drug use: No  . Sexual activity: Yes    Birth  control/protection: None  Other Topics Concern  . 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 APPENDECTOMY    . BREAST REDUCTION SURGERY    . CESAREAN SECTION    . TUBAL LIGATION      Family History  Problem Relation Age of Onset  . Alcohol abuse Father   . Throat cancer Father   . Heart disease Father   . Hypertension Father   . Breast cancer Mother   . Breast cancer Maternal Aunt   . Heart disease Paternal Aunt   . Colon cancer Neg Hx     No Known Allergies  Current Outpatient Medications on File Prior to Visit  Medication Sig Dispense Refill  . Eflornithine HCl 13.9 % cream Apply thin layer to affected areas of face and under the chin twice daily 45 g 3  . fluticasone (FLONASE) 50 MCG/ACT nasal spray Place into the nose.    . levothyroxine (SYNTHROID) 50 MCG tablet TAKE 1 TABLET BY MOUTH  EVERY DAY BEFORE BREAKFAST 30 tablet 0  . liothyronine (CYTOMEL) 25 MCG tablet Take 0.5 tablets (12.5 mcg total) by mouth daily. 30 tablet 0   No current facility-administered medications on file prior to visit.    There were no vitals taken for this visit.      Objective:   Physical Exam Vitals and nursing note reviewed.  Constitutional:      General: She is not in acute distress.    Appearance: Normal appearance. She is well-developed. She is not ill-appearing.  HENT:     Head: Normocephalic and atraumatic.     Right Ear: Tympanic membrane, ear canal and external ear normal. There is no impacted cerumen.      Left Ear: Tympanic membrane, ear canal and external ear normal. There is no impacted cerumen.     Nose: Nose normal. No congestion or rhinorrhea.     Mouth/Throat:     Mouth: Mucous membranes are moist.     Pharynx: Oropharynx is clear. No oropharyngeal exudate or posterior oropharyngeal erythema.  Eyes:     General:        Right eye: No discharge.        Left eye: No discharge.     Extraocular Movements: Extraocular movements intact.     Conjunctiva/sclera: Conjunctivae normal.     Pupils: Pupils are equal, round, and reactive to light.  Neck:     Thyroid: No thyromegaly.     Vascular: No carotid bruit.     Trachea: No tracheal deviation.  Cardiovascular:     Rate and Rhythm: Normal rate and regular rhythm.     Pulses: Normal pulses.     Heart sounds: Normal heart sounds. No murmur heard. No friction rub. No gallop.   Pulmonary:     Effort: Pulmonary effort is normal. No respiratory distress.     Breath sounds: Normal breath sounds. No stridor. No wheezing, rhonchi or rales.  Chest:     Chest wall: No tenderness.  Abdominal:     General: Abdomen is flat. Bowel sounds are normal. There is no distension.     Palpations: Abdomen is soft. There is no mass.     Tenderness: There is no abdominal tenderness. There is no right CVA tenderness, left CVA tenderness, guarding or rebound.     Hernia: No hernia is present.  Musculoskeletal:        General: No swelling, tenderness, deformity or signs of injury. Normal range of motion.     Cervical back: Normal range of motion and neck supple.     Right lower leg: No edema.     Left lower leg: No edema.  Lymphadenopathy:     Cervical: No cervical adenopathy.  Skin:    General: Skin is warm and dry.     Coloration: Skin is not jaundiced or pale.     Findings: No bruising, erythema, lesion or rash.  Neurological:     General: No focal deficit present.     Mental Status: She is alert and oriented to person, place, and time.     Cranial  Nerves: No cranial nerve deficit.     Sensory: No sensory deficit.     Motor: No weakness.     Coordination: Coordination normal.     Gait: Gait normal.     Deep Tendon Reflexes: Reflexes normal.  Psychiatric:        Mood and Affect: Mood normal.        Behavior: Behavior normal.  Thought Content: Thought content normal.        Judgment: Judgment normal.       Assessment & Plan:  1. Routine general medical examination at a health care facility - Follow up in one year  - Encouraged lifestyle modifications  - CBC with Differential/Platelet; Future - Comprehensive metabolic panel; Future - Lipid panel; Future - TSH; Future  2. Hypothyroidism, unspecified type - Consider increase in synthroid  - CBC with Differential/Platelet; Future - Comprehensive metabolic panel; Future - Lipid panel; Future - TSH; Future  3. Need for hepatitis C screening test  - Hep C Antibody; Future  4. Need for shingles vaccine  - Varicella-zoster vaccine IM   Dorothyann Peng, NP

## 2021-02-28 NOTE — Patient Instructions (Signed)
It was great seeing you today  Please schedule a lab appointment at the front desk - come fasting.   We will follow up with you regarding your blood work   You will need your second shingles vaccination in about 8 weeks

## 2021-03-02 ENCOUNTER — Other Ambulatory Visit (INDEPENDENT_AMBULATORY_CARE_PROVIDER_SITE_OTHER): Payer: Federal, State, Local not specified - PPO

## 2021-03-02 ENCOUNTER — Other Ambulatory Visit: Payer: Self-pay

## 2021-03-02 DIAGNOSIS — Z0000 Encounter for general adult medical examination without abnormal findings: Secondary | ICD-10-CM | POA: Diagnosis not present

## 2021-03-02 DIAGNOSIS — Z1159 Encounter for screening for other viral diseases: Secondary | ICD-10-CM | POA: Diagnosis not present

## 2021-03-02 DIAGNOSIS — E039 Hypothyroidism, unspecified: Secondary | ICD-10-CM | POA: Diagnosis not present

## 2021-03-03 LAB — CBC WITH DIFFERENTIAL/PLATELET
Basophils Absolute: 0.1 10*3/uL (ref 0.0–0.1)
Basophils Relative: 1.9 % (ref 0.0–3.0)
Eosinophils Absolute: 0.2 10*3/uL (ref 0.0–0.7)
Eosinophils Relative: 3 % (ref 0.0–5.0)
HCT: 39.8 % (ref 36.0–46.0)
Hemoglobin: 13 g/dL (ref 12.0–15.0)
Lymphocytes Relative: 41.2 % (ref 12.0–46.0)
Lymphs Abs: 2.4 10*3/uL (ref 0.7–4.0)
MCHC: 32.6 g/dL (ref 30.0–36.0)
MCV: 71.2 fl — ABNORMAL LOW (ref 78.0–100.0)
Monocytes Absolute: 0.3 10*3/uL (ref 0.1–1.0)
Monocytes Relative: 5.3 % (ref 3.0–12.0)
Neutro Abs: 2.9 10*3/uL (ref 1.4–7.7)
Neutrophils Relative %: 48.6 % (ref 43.0–77.0)
Platelets: 203 10*3/uL (ref 150.0–400.0)
RBC: 5.59 Mil/uL — ABNORMAL HIGH (ref 3.87–5.11)
RDW: 18.7 % — ABNORMAL HIGH (ref 11.5–15.5)
WBC: 5.9 10*3/uL (ref 4.0–10.5)

## 2021-03-03 LAB — HEPATITIS C ANTIBODY
Hepatitis C Ab: NONREACTIVE
SIGNAL TO CUT-OFF: 0.05 (ref <1.00)

## 2021-03-03 LAB — TSH: TSH: 3.48 u[IU]/mL (ref 0.35–4.50)

## 2021-03-06 LAB — COMPREHENSIVE METABOLIC PANEL
ALT: 14 U/L (ref 0–35)
AST: 15 U/L (ref 0–37)
Albumin: 4.6 g/dL (ref 3.5–5.2)
Alkaline Phosphatase: 62 U/L (ref 39–117)
BUN: 11 mg/dL (ref 6–23)
CO2: 28 mEq/L (ref 19–32)
Calcium: 9.4 mg/dL (ref 8.4–10.5)
Chloride: 101 mEq/L (ref 96–112)
Creatinine, Ser: 1 mg/dL (ref 0.40–1.20)
GFR: 63.19 mL/min (ref >60.00)
Glucose, Bld: 98 mg/dL (ref 70–99)
Potassium: 4.1 mEq/L (ref 3.5–5.1)
Sodium: 140 mEq/L (ref 135–145)
Total Bilirubin: 0.8 mg/dL (ref 0.2–1.2)
Total Protein: 7.4 g/dL (ref 6.0–8.3)

## 2021-03-06 LAB — LIPID PANEL
Cholesterol: 184 mg/dL (ref 0–200)
HDL: 47.3 mg/dL (ref >39.00)
LDL Cholesterol: 121 mg/dL — ABNORMAL HIGH (ref 0–99)
NonHDL: 136.62
Total CHOL/HDL Ratio: 4
Triglycerides: 76 mg/dL (ref 0.0–149.0)
VLDL: 15.2 mg/dL (ref 0.0–40.0)

## 2021-03-20 ENCOUNTER — Ambulatory Visit: Payer: Federal, State, Local not specified - PPO

## 2021-03-21 ENCOUNTER — Ambulatory Visit: Payer: Federal, State, Local not specified - PPO

## 2021-03-22 ENCOUNTER — Other Ambulatory Visit: Payer: Self-pay | Admitting: Adult Health

## 2021-04-27 ENCOUNTER — Ambulatory Visit: Payer: Federal, State, Local not specified - PPO

## 2021-05-17 ENCOUNTER — Other Ambulatory Visit: Payer: Self-pay | Admitting: Adult Health

## 2021-05-19 ENCOUNTER — Other Ambulatory Visit: Payer: Self-pay | Admitting: Adult Health

## 2021-05-22 ENCOUNTER — Telehealth: Payer: Self-pay

## 2021-05-22 NOTE — Telephone Encounter (Signed)
Received telephone Advice Record for pt. Pt called triage c/o 1 day sx of joint pain. Called pt to try to schedule a visit but no answer or vm. Will try again shortly.

## 2021-05-23 NOTE — Telephone Encounter (Signed)
Called pt 2x no answer. Lm for pt to return call. Was calling to get an update on her joint pain.

## 2021-05-24 NOTE — Telephone Encounter (Signed)
Called pt again no answer

## 2021-06-10 ENCOUNTER — Other Ambulatory Visit: Payer: Self-pay

## 2021-06-10 ENCOUNTER — Emergency Department (HOSPITAL_BASED_OUTPATIENT_CLINIC_OR_DEPARTMENT_OTHER)
Admission: EM | Admit: 2021-06-10 | Discharge: 2021-06-11 | Disposition: A | Discharge status: Home / Self Care | Payer: Federal, State, Local not specified - PPO | Attending: Emergency Medicine | Admitting: Emergency Medicine

## 2021-06-10 ENCOUNTER — Encounter (HOSPITAL_BASED_OUTPATIENT_CLINIC_OR_DEPARTMENT_OTHER): Payer: Self-pay | Admitting: Emergency Medicine

## 2021-06-10 DIAGNOSIS — E039 Hypothyroidism, unspecified: Secondary | ICD-10-CM | POA: Diagnosis not present

## 2021-06-10 DIAGNOSIS — J40 Bronchitis, not specified as acute or chronic: Secondary | ICD-10-CM | POA: Insufficient documentation

## 2021-06-10 DIAGNOSIS — R059 Cough, unspecified: Secondary | ICD-10-CM

## 2021-06-10 DIAGNOSIS — Z79899 Other long term (current) drug therapy: Secondary | ICD-10-CM | POA: Insufficient documentation

## 2021-06-10 NOTE — ED Triage Notes (Signed)
Pt c/o cough x 3 weeks and difficulty breathing when laying down. Pt took 2 home COVID tests that returned negative. Pt is fully vaccinated for COVID including booster.

## 2021-06-11 ENCOUNTER — Emergency Department (HOSPITAL_BASED_OUTPATIENT_CLINIC_OR_DEPARTMENT_OTHER): Payer: Federal, State, Local not specified - PPO

## 2021-06-11 DIAGNOSIS — J40 Bronchitis, not specified as acute or chronic: Secondary | ICD-10-CM | POA: Diagnosis not present

## 2021-06-11 DIAGNOSIS — R059 Cough, unspecified: Secondary | ICD-10-CM | POA: Diagnosis not present

## 2021-06-11 MED ORDER — DOXYCYCLINE HYCLATE 100 MG PO CAPS: 100.0000 mg | ORAL_CAPSULE | Freq: Two times a day (BID) | ORAL | 0 refills | Status: DC

## 2021-06-11 MED ORDER — PREDNISONE 20 MG PO TABS
40.0000 mg | ORAL_TABLET | Freq: Every day | ORAL | 0 refills | Status: DC
Start: 2021-06-11 — End: 2021-06-11

## 2021-06-11 MED ORDER — PREDNISONE 20 MG PO TABS: 40.0000 mg | ORAL_TABLET | Freq: Every day | ORAL | 0 refills | Status: DC

## 2021-06-11 MED ORDER — DOXYCYCLINE HYCLATE 100 MG PO TABS
100.0000 mg | ORAL_TABLET | Freq: Once | ORAL | Status: AC
  Administered 2021-06-11: 100 mg via ORAL
  Filled 2021-06-11: qty 1

## 2021-06-11 MED ORDER — ALBUTEROL SULFATE HFA 108 (90 BASE) MCG/ACT IN AERS
2.0000 | INHALATION_SPRAY | RESPIRATORY_TRACT | Status: DC | PRN
  Administered 2021-06-11: 2 via RESPIRATORY_TRACT
  Filled 2021-06-11: qty 6.7

## 2021-06-11 MED ORDER — PREDNISONE 50 MG PO TABS
60.0000 mg | ORAL_TABLET | Freq: Once | ORAL | Status: AC
  Administered 2021-06-11: 60 mg via ORAL
  Filled 2021-06-11: qty 1

## 2021-06-11 NOTE — ED Provider Notes (Signed)
[facility] EMERGENCY DEPT Provider Note   CSN: [id number] Arrival date & time: 06/10/21  2221     History Chief Complaint  Patient presents with   Cough    Shelby Espinoza is a 55 y.o. female.  Patient presents to the emergency department for evaluation of persistent cough.  Patient reports that symptoms have been present for 3 weeks.  Cough has been more severe when lying down at night.  She has taken multiple COVID tests that are negative.      Past Medical History:  Diagnosis Date   GERD (gastroesophageal reflux disease)    Hypothyroidism     Patient Active Problem List   Diagnosis Date Noted   Viral URI 05/17/2018   Hypothyroidism 05/02/2007    Past Surgical History:  Procedure Laterality Date   APPENDECTOMY     BREAST REDUCTION SURGERY     CESAREAN SECTION     TUBAL LIGATION       OB History   No obstetric history on file.     Family History  Problem Relation Age of Onset   Alcohol abuse Father    Throat cancer Father    Heart disease Father    Hypertension Father    Breast cancer Mother    Breast cancer Maternal Aunt    Heart disease Paternal Aunt    Colon cancer Neg Hx     Social History   Tobacco Use   Smoking status: Never   Smokeless tobacco: Never  Vaping Use   Vaping Use: Never used  Substance Use Topics   Alcohol use: No   Drug use: No    Home Medications Prior to Admission medications   Medication Sig Start Date End Date Taking? Authorizing Provider  doxycycline (VIBRAMYCIN) 100 MG capsule Take 1 capsule (100 mg total) by mouth 2 (two) times daily. 06/11/21   Orpah Greek, MD  fluticasone (FLONASE) 50 MCG/ACT nasal spray Place into the nose. 02/17/15   [provider]  levothyroxine (SYNTHROID) 50 MCG tablet TAKE 1 CAPSULE BY MOUTH EVERY DAY BEFORE BREAKFAST 05/17/21   Nafziger, Tommi Rumps, NP  liothyronine (CYTOMEL) 25 MCG tablet TAKE 1/2 TABLET BY MOUTH EVERY DAY 05/22/21   Nafziger, Tommi Rumps, NP   predniSONE (DELTASONE) 20 MG tablet Take 2 tablets (40 mg total) by mouth daily with breakfast. 06/11/21   Cerita Rabelo, Gwenyth Allegra, MD    Allergies    Patient has no known allergies.  Review of Systems   Review of Systems  Respiratory:  Positive for cough.   All other systems reviewed and are negative.  Physical Exam Updated Vital Signs BP (!) 136/92 (BP Location: Left Arm)   Pulse 68   Temp 98.3 F (36.8 C) (Oral)   Resp 18   Ht '5\' 4"'$  (1.626 m)   Wt 81.6 kg   LMP 11/01/2016   SpO2 99%   BMI 30.90 kg/m   Physical Exam Vitals and nursing note reviewed.  Constitutional:      General: She is not in acute distress.    Appearance: Normal appearance. She is well-developed.  HENT:     Head: Normocephalic and atraumatic.     Right Ear: Hearing normal.     Left Ear: Hearing normal.     Nose: Nose normal.  Eyes:     Conjunctiva/sclera: Conjunctivae normal.     Pupils: Pupils are equal, round, and reactive to light.  Cardiovascular:     Rate and Rhythm: Regular rhythm.     Heart  sounds: S1 normal and S2 normal. No murmur heard.   No friction rub. No gallop.  Pulmonary:     Effort: Pulmonary effort is normal. No respiratory distress.     Breath sounds: Wheezing present.  Chest:     Chest wall: No tenderness.  Abdominal:     General: Bowel sounds are normal.     Palpations: Abdomen is soft.     Tenderness: There is no abdominal tenderness. There is no guarding or rebound. Negative signs include Murphy's sign and McBurney's sign.     Hernia: No hernia is present.  Musculoskeletal:        General: Normal range of motion.     Cervical back: Normal range of motion and neck supple.  Skin:    General: Skin is warm and dry.     Findings: No rash.  Neurological:     Mental Status: She is alert and oriented to person, place, and time.     GCS: GCS eye subscore is 4. GCS verbal subscore is 5. GCS motor subscore is 6.     Cranial Nerves: No cranial nerve deficit.     Sensory: No  sensory deficit.     Coordination: Coordination normal.  Psychiatric:        Speech: Speech normal.        Behavior: Behavior normal.        Thought Content: Thought content normal.    ED Results / Procedures / Treatments   Labs (all labs ordered are listed, but only abnormal results are displayed) Labs Reviewed - No data to display  EKG None  Radiology DG Chest 1 View  Result Date: 06/11/2021 CLINICAL DATA:  Cough EXAM: CHEST  1 VIEW COMPARISON:  None. FINDINGS: Lungs are clear.  No pleural effusion or pneumothorax. The heart is normal in size. Surgical clips along the bilateral neck. IMPRESSION: No evidence of acute cardiopulmonary disease. Electronically Signed   By: Julian Hy M.D.   On: 06/11/2021 00:22    Procedures Procedures   Medications Ordered in ED Medications  predniSONE (DELTASONE) tablet 60 mg (has no administration in time range)  albuterol (VENTOLIN HFA) 108 (90 Base) MCG/ACT inhaler 2 puff (has no administration in time range)  doxycycline (VIBRA-TABS) tablet 100 mg (100 mg Oral Given 06/11/21 0021)    ED Course  I have reviewed the triage vital signs and the nursing notes.  Pertinent labs & imaging results that were available during my care of the patient were reviewed by me and considered in my medical decision making (see chart for details).    MDM Rules/Calculators/A&P                           Patient presents to the emergency department for evaluation of persistent cough.  Chest x-ray does not show any evidence of pneumonia.  Patient does have some mild wheezing on exam.  Presentation is consistent with bronchitis with bronchospasm.  Will treat with albuterol, prednisone, doxycycline.  Follow-up as needed.  Final Clinical Impression(s) / ED Diagnoses Final diagnoses:  Bronchitis    Rx / DC Orders ED Discharge Orders          Ordered    doxycycline (VIBRAMYCIN) 100 MG capsule  2 times daily,   Status:  Discontinued        06/11/21 0028     predniSONE (DELTASONE) 20 MG tablet  Daily with breakfast,   Status:  Discontinued  06/11/21 0028    doxycycline (VIBRAMYCIN) 100 MG capsule  2 times daily        06/11/21 0029    predniSONE (DELTASONE) 20 MG tablet  Daily with breakfast        06/11/21 0029             Orpah Greek, MD 06/11/21 [phone]

## 2021-06-11 NOTE — ED Notes (Signed)
This RN presented the AVS utilizing Teachback Method. Patient verbalizes understanding of Discharge Instructions. Opportunity for Questioning and Answers were provided. Patient Discharged from ED ambulatory to Home via SELF.

## 2021-06-12 ENCOUNTER — Telehealth: Payer: Self-pay | Admitting: Adult Health

## 2021-06-12 NOTE — Telephone Encounter (Signed)
Ok for letter

## 2021-06-12 NOTE — Telephone Encounter (Signed)
Patient has a question about getting a letter for a leave of absence for work.     Good callback number is [phone]    Please Advise

## 2021-06-14 ENCOUNTER — Encounter: Payer: Self-pay | Admitting: Adult Health

## 2021-06-14 NOTE — Telephone Encounter (Signed)
Spoke to pt and she stated that the letter needs to say specific information. Pt advised to send to Mychart the specifics of the letter. Pt advised of update.

## 2021-07-14 ENCOUNTER — Other Ambulatory Visit: Payer: Self-pay | Admitting: Adult Health

## 2021-08-15 ENCOUNTER — Ambulatory Visit: Payer: Federal, State, Local not specified - PPO

## 2021-08-18 ENCOUNTER — Encounter: Payer: Self-pay | Admitting: Adult Health

## 2021-08-18 ENCOUNTER — Other Ambulatory Visit: Payer: Self-pay | Admitting: Adult Health

## 2021-08-18 ENCOUNTER — Ambulatory Visit: Payer: Federal, State, Local not specified - PPO | Admitting: Adult Health

## 2021-08-18 VITALS — BP 138/80 | HR 104 | Temp 97.0°F | Ht 64.0 in | Wt 189.0 lb

## 2021-08-18 DIAGNOSIS — B349 Viral infection, unspecified: Secondary | ICD-10-CM
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
## 2021-09-06 ENCOUNTER — Other Ambulatory Visit: Payer: Self-pay | Admitting: Adult Health

## 2021-09-07 ENCOUNTER — Telehealth: Payer: Self-pay

## 2021-09-07 NOTE — Telephone Encounter (Addendum)
Caller states she was in the office before Thanksgiving with swollen throat and dx with a virus. Now she has has pealing inside her mouth and tongue is swelling. She wants to see about getting a virtual appointment today if possible. She is currently in [place] and scheduled to leave on a cruise on on 12/16. Denies fever.  09/07/2021 7:23:19 AM See PCP within 2 Timothy Lasso, RN, Karis  Comments User: Conni Elliot, RN Date/Time Eilene Ghazi Time): 09/07/2021 7:17:58 AM A new feeling in her mouth starting this morning  09/07/21 1121: Pt advised that since she is out of the state she will need go to UC to have her mouth assessed. Pt states she doesn't think she wants to do that, but verb understanding.

## 2021-09-08 ENCOUNTER — Telehealth: Payer: Federal, State, Local not specified - PPO | Admitting: Adult Health

## 2021-10-23 ENCOUNTER — Telehealth: Payer: Self-pay | Admitting: Adult Health

## 2021-10-23 NOTE — Telephone Encounter (Signed)
Pt spoke to [facility] in December about her glands were swollen--Cory stated her body was fighting off an infection, had to let it run it's course.  About a week later abcesses came up in patient's mouth--local [facility] gave her Magic Mouthwash.  Used for 2 weeks--all cleared up except for one on her inside lip.  Her tongue also feels heavy--she is wanting to know if there is something she can do to clear up this last abcess.  She doesn't have any Magic Mouthwash left.  Patient is requesting a call back.

## 2021-10-24 NOTE — Telephone Encounter (Signed)
Left a message for the patient to return my call.  

## 2021-10-27 NOTE — Telephone Encounter (Signed)
Left message to return phone call.

## 2021-10-27 NOTE — Telephone Encounter (Signed)
Spoke to pt and advised that she needed to make an appt. Per notes pt advised to follow up the following week of appt. Pt declined sooner appt. For Monday stated that she will wait to see [facility]. Pt will call back and schedule appt.

## 2021-11-04 ENCOUNTER — Other Ambulatory Visit: Payer: Self-pay | Admitting: Adult Health

## 2021-11-15 ENCOUNTER — Other Ambulatory Visit: Payer: Self-pay | Admitting: Adult Health

## 2021-12-22 DIAGNOSIS — Z1231 Encounter for screening mammogram for malignant neoplasm of breast: Secondary | ICD-10-CM | POA: Diagnosis not present

## 2021-12-30 ENCOUNTER — Other Ambulatory Visit: Payer: Self-pay | Admitting: Adult Health

## 2022-01-06 ENCOUNTER — Emergency Department (HOSPITAL_BASED_OUTPATIENT_CLINIC_OR_DEPARTMENT_OTHER)
Admission: EM | Admit: 2022-01-06 | Discharge: 2022-01-06 | Disposition: A | Discharge status: Home / Self Care | Payer: Federal, State, Local not specified - PPO | Attending: Emergency Medicine | Admitting: Emergency Medicine

## 2022-01-06 ENCOUNTER — Encounter (HOSPITAL_BASED_OUTPATIENT_CLINIC_OR_DEPARTMENT_OTHER): Payer: Self-pay | Admitting: Emergency Medicine

## 2022-01-06 ENCOUNTER — Other Ambulatory Visit: Payer: Self-pay

## 2022-01-06 DIAGNOSIS — G8929 Other chronic pain: Secondary | ICD-10-CM | POA: Diagnosis not present

## 2022-01-06 DIAGNOSIS — M25562 Pain in left knee: Secondary | ICD-10-CM | POA: Diagnosis not present

## 2022-01-06 DIAGNOSIS — M25561 Pain in right knee: Secondary | ICD-10-CM | POA: Insufficient documentation

## 2022-01-06 DIAGNOSIS — Z5321 Procedure and treatment not carried out due to patient leaving prior to being seen by health care provider: Secondary | ICD-10-CM | POA: Insufficient documentation

## 2022-01-06 NOTE — ED Triage Notes (Signed)
Right knee throbbing for last 2 nights. Has some chronic pain in both knees but this is worse.  ?

## 2022-01-08 ENCOUNTER — Ambulatory Visit (INDEPENDENT_AMBULATORY_CARE_PROVIDER_SITE_OTHER): Payer: Federal, State, Local not specified - PPO

## 2022-01-08 ENCOUNTER — Ambulatory Visit (INDEPENDENT_AMBULATORY_CARE_PROVIDER_SITE_OTHER): Payer: Federal, State, Local not specified - PPO | Admitting: Family Medicine

## 2022-01-08 VITALS — BP 140/96 | HR 70 | Temp 97.4°F | Ht 64.0 in | Wt 189.0 lb

## 2022-01-08 DIAGNOSIS — M25561 Pain in right knee: Secondary | ICD-10-CM

## 2022-01-08 DIAGNOSIS — M25469 Effusion, unspecified knee: Secondary | ICD-10-CM

## 2022-01-08 MED ORDER — MELOXICAM 7.5 MG PO TABS: 7.5000 mg | ORAL_TABLET | Freq: Every day | ORAL | 0 refills | Status: DC

## 2022-01-08 NOTE — Progress Notes (Signed)
Subjective:  ? ? Patient ID: Shelby Espinoza, female    DOB: Apr 25, 1966, 56 y.o.   MRN: [id number] ? ?Chief Complaint  ?Patient presents with  ? Knee Pain  ?  Patient complains of right knee pain, x1 week, Patient denies any injury, Tried Ibuprofen, Tylenol and Advil with little relief  ? ? ?HPI ?Patient was seen today for acute concern.  Patient endorses right knee pain x 1 week which caused her to limp.  Denies injury.  Pt has been in [place] x 6 mos for work with [employer] and recently returned to the area.  States exercised regularly while away.  Increased steps as part of a challenge at work. Yesterday while walking, R knee felt "gave out", pt's friend was able to catch her to prevent her from falling.  Pt unable to bend knee.  Endorses edema.  Pain in anterior knee and shoots up to R thigh.  Tried Tylenol and ice.  Pt had difficulty driving to clinic today, used her L leg.  Pt is supposed to drive to [place] to work in office at [employer] for 2 days later this wk. ? ?Past Medical History:  ?Diagnosis Date  ? GERD (gastroesophageal reflux disease)   ? Hypothyroidism   ? ? ?No Known Allergies ?ROS ?General: Denies fever, chills, night sweats, changes in weight, changes in appetite ?HEENT: Denies headaches, ear pain, changes in vision, rhinorrhea, sore throat ?CV: Denies CP, palpitations, SOB, orthopnea ?Pulm: Denies SOB, cough, wheezing ?GI: Denies abdominal pain, nausea, vomiting, diarrhea, constipation ?GU: Denies dysuria, hematuria, frequency, vaginal discharge ?Msk: Denies muscle cramps  + R knee pain and edema ?Neuro: Denies weakness, numbness, tingling ?Skin: Denies rashes, bruising ?Psych: Denies depression, anxiety, hallucinations ? ?   ?Objective:  ?  ?Blood pressure (!) 140/96, pulse 70, temperature (!) 97.4 ?F (36.3 ?C), temperature source Oral, height '5\' 4"'$  (1.626 m), weight 189 lb (85.7 kg), last menstrual period 11/01/2016, SpO2 98 %. ? ?Gen. Pleasant, well-nourished, in no distress, normal affect  ?HEENT:  [place]/AT, face symmetric, conjunctiva clear, no scleral icterus, PERRLA, EOMI, nares patent without drainage ?Lungs: no accessory muscle use  ?Cardiovascular: RRR, no peripheral edema ?Musculoskeletal: RLE in extension as pt unable to flex 2/2 pain.  TTP of anterior R knee/patellar tendon worse than other areas.   TTP of knee at joint line, mild effusion.  No TTP of calf or R thigh.  No deformities, no cyanosis or clubbing, normal tone. ?Neuro:  A&Ox3, CN II-XII intact, normal gait ?Skin:  Warm, no lesions/ rash. ? ?Wt Readings from Last 3 Encounters:  ?01/08/22 189 lb (85.7 kg)  ?08/18/21 189 lb (85.7 kg)  ?06/10/21 180 lb (81.6 kg)  ? ? ?Lab Results  ?Component Value Date  ? WBC 5.9 03/02/2021  ? HGB 13.0 03/02/2021  ? HCT 39.8 03/02/2021  ? PLT 203.0 03/02/2021  ? GLUCOSE 98 03/02/2021  ? CHOL 184 03/02/2021  ? TRIG 76.0 03/02/2021  ? HDL 47.30 03/02/2021  ? LDLCALC 121 (H) 03/02/2021  ? ALT 14 03/02/2021  ? AST 15 03/02/2021  ? NA 140 03/02/2021  ? K 4.1 03/02/2021  ? CL 101 03/02/2021  ? CREATININE 1.00 03/02/2021  ? BUN 11 03/02/2021  ? CO2 28 03/02/2021  ? TSH 3.48 03/02/2021  ? HGBA1C 5.8 08/28/2017  ? ? ?Assessment/Plan: ? On day of service, 35 minutes spent caring for this patient face-to-face, reviewing the chart, counseling and/or coordinating care for plan and treatment of diagnosis below.   ? ? ?Acute  pain of right knee  ?-new problem ?-discussed possible causes including arthritis, ligament injury, cartilage injury, or inflammation due to  overuse. ?-Discussed supportive care including ice, heat, topical analgesics, rest, compression, elevations. ?-Small effusion on exam not amenable to drainage at this time. ?-Given inability to flex knee discussed obtaining imaging. ?-Knee immobilizer given in clinic ?-Mobic prn ?-Further recs based on imaging such as PT or Ortho. ?-given note for work, pt unable to drive to [place] later this wk. ?- Plan: DG Knee Complete 4 Views Right, meloxicam (MOBIC) 7.5 MG  tablet, Knee immobilizer ? ?Edema of knee ?-new problem ?-supportive care as above ?-given handout ? - Plan: DG Knee Complete 4 Views Right, meloxicam (MOBIC) 7.5 MG tablet ? ?F/u prn ? ?Grier Mitts, MD ?

## 2022-01-17 ENCOUNTER — Encounter: Payer: Self-pay | Admitting: Adult Health

## 2022-01-17 ENCOUNTER — Telehealth: Payer: Federal, State, Local not specified - PPO | Admitting: Adult Health

## 2022-01-17 VITALS — HR 77 | Ht 64.0 in | Wt 184.0 lb

## 2022-01-17 DIAGNOSIS — M25561 Pain in right knee: Secondary | ICD-10-CM

## 2022-01-17 NOTE — Progress Notes (Signed)
Virtual Visit via Video Note ? ?I connected with Shelby Espinoza on 01/17/22 at 11:45 AM EDT by a video enabled telemedicine application and verified that I am speaking with the correct person using two identifiers. ? Location patient: home ?Location provider:work or home office ?Persons participating in the virtual visit: patient, provider ? ?I discussed the limitations of evaluation and management by telemedicine and the availability of in person appointments. The patient expressed understanding and agreed to proceed. ? ? ?HPI: ?She was seen 9 days ago by another provider in the office for acute right knee pain x1 week.  She reports that she was walking with a friend and her knee "gave out".  Her friend was able to catch her to prevent her from falling.  She was unable to bend her knee.  When she was seen in the office she had an x-ray done showed moderate knee joint effusion and mild to moderate osteoarthritis.  She was prescribed oxycodone 7.5 mg. ? ?Today she reports that her knee continues to be swollen and painful.  She has a hard time with range of motion of the right knee.  The only time she gets relief is when she is elevating taking her anti-inflammatory medication. ? ?She is supposed to drive to [place] for work but does not feel as though she can do that due to this being her right knee ? ? ?ROS: See pertinent positives and negatives per HPI. ? ?Past Medical History:  ?Diagnosis Date  ? GERD (gastroesophageal reflux disease)   ? Hypothyroidism   ? ? ?Past Surgical History:  ?Procedure Laterality Date  ? APPENDECTOMY    ? BREAST REDUCTION SURGERY    ? CESAREAN SECTION    ? TUBAL LIGATION    ? ? ?Family History  ?Problem Relation Age of Onset  ? Alcohol abuse Father   ? Throat cancer Father   ? Heart disease Father   ? Hypertension Father   ? Breast cancer Mother   ? Breast cancer Maternal Aunt   ? Heart disease Paternal Aunt   ? Colon cancer Neg Hx   ? ? ? ? ? ?Current Outpatient Medications:  ?   levothyroxine (SYNTHROID) 50 MCG tablet, TAKE 1 CAPSULE BY MOUTH EVERY DAY BEFORE BREAKFAST, Disp: 90 tablet, Rfl: 0 ?  liothyronine (CYTOMEL) 25 MCG tablet, Take 0.5 tablets (12.5 mcg total) by mouth daily., Disp: 30 tablet, Rfl: 1 ?  meloxicam (MOBIC) 7.5 MG tablet, Take 1 tablet (7.5 mg total) by mouth daily., Disp: 30 tablet, Rfl: 0 ?  EPINEPHrine 0.3 mg/0.3 mL IJ SOAJ injection, PLEASE SEE ATTACHED FOR DETAILED DIRECTIONS, Disp: , Rfl:  ? ?EXAM: ? ?VITALS per patient if applicable: ? ?GENERAL: alert, oriented, appears well and in no acute distress ? ?HEENT: atraumatic, conjunttiva clear, no obvious abnormalities on inspection of external nose and ears ? ?NECK: normal movements of the head and neck ? ?LUNGS: on inspection no signs of respiratory distress, breathing rate appears normal, no obvious gross SOB, gasping or wheezing ? ?CV: no obvious cyanosis ? ?MS: moves all visible extremities without noticeable abnormality ? ?PSYCH/NEURO: pleasant and cooperative, no obvious depression or anxiety, speech and thought processing grossly intact ? ?ASSESSMENT AND PLAN: ? ?Discussed the following assessment and plan: ? ?1. Acute pain of right knee ?-Concern for soft tissue injury such as meniscal tear.  We will order MRI.  Work note will be placed in my chart.  Encouraged to continue with anti-inflammatories, elevation, compression, and ice.  Follow-up if  anything changes ?- MR Knee Right Wo Contrast; Future ? ? ? ?  ?I discussed the assessment and treatment plan with the patient. The patient was provided an opportunity to ask questions and all were answered. The patient agreed with the plan and demonstrated an understanding of the instructions. ?  ?The patient was advised to call back or seek an in-person evaluation if the symptoms worsen or if the condition fails to improve as anticipated. ? ? ?Dorothyann Peng, NP  ? ?

## 2022-01-22 DIAGNOSIS — Z01419 Encounter for gynecological examination (general) (routine) without abnormal findings: Secondary | ICD-10-CM | POA: Diagnosis not present

## 2022-01-22 DIAGNOSIS — Z6836 Body mass index (BMI) 36.0-36.9, adult: Secondary | ICD-10-CM | POA: Diagnosis not present

## 2022-01-30 ENCOUNTER — Other Ambulatory Visit: Payer: Federal, State, Local not specified - PPO

## 2022-02-04 ENCOUNTER — Other Ambulatory Visit: Payer: Self-pay | Admitting: Family Medicine

## 2022-02-04 DIAGNOSIS — M25469 Effusion, unspecified knee: Secondary | ICD-10-CM

## 2022-02-04 DIAGNOSIS — M25561 Pain in right knee: Secondary | ICD-10-CM

## 2022-02-12 ENCOUNTER — Other Ambulatory Visit: Payer: Federal, State, Local not specified - PPO

## 2022-02-14 ENCOUNTER — Other Ambulatory Visit: Payer: Self-pay | Admitting: Adult Health

## 2022-02-28 ENCOUNTER — Other Ambulatory Visit: Payer: Self-pay | Admitting: Adult Health

## 2022-03-06 ENCOUNTER — Encounter: Payer: Federal, State, Local not specified - PPO | Admitting: Adult Health

## 2022-03-11 ENCOUNTER — Other Ambulatory Visit: Payer: Self-pay | Admitting: Adult Health

## 2022-03-11 DIAGNOSIS — M25561 Pain in right knee: Secondary | ICD-10-CM

## 2022-03-11 DIAGNOSIS — M25469 Effusion, unspecified knee: Secondary | ICD-10-CM

## 2022-03-22 ENCOUNTER — Ambulatory Visit (INDEPENDENT_AMBULATORY_CARE_PROVIDER_SITE_OTHER): Payer: Federal, State, Local not specified - PPO | Admitting: Adult Health

## 2022-03-22 ENCOUNTER — Encounter: Payer: Self-pay | Admitting: Adult Health

## 2022-03-22 ENCOUNTER — Other Ambulatory Visit (INDEPENDENT_AMBULATORY_CARE_PROVIDER_SITE_OTHER): Payer: Federal, State, Local not specified - PPO

## 2022-03-22 VITALS — BP 120/80 | HR 65 | Temp 98.0°F | Ht 63.5 in | Wt 185.0 lb

## 2022-03-22 DIAGNOSIS — Z0000 Encounter for general adult medical examination without abnormal findings: Secondary | ICD-10-CM | POA: Diagnosis not present

## 2022-03-22 DIAGNOSIS — Z114 Encounter for screening for human immunodeficiency virus [HIV]: Secondary | ICD-10-CM

## 2022-03-22 DIAGNOSIS — E039 Hypothyroidism, unspecified: Secondary | ICD-10-CM | POA: Diagnosis not present

## 2022-03-22 DIAGNOSIS — R7309 Other abnormal glucose: Secondary | ICD-10-CM

## 2022-03-22 LAB — LIPID PANEL
Cholesterol: 155 mg/dL (ref 0–200)
HDL: 40.3 mg/dL (ref >39.00)
LDL Cholesterol: 101 mg/dL — ABNORMAL HIGH (ref 0–99)
NonHDL: 114.4
Total CHOL/HDL Ratio: 4
Triglycerides: 69 mg/dL (ref 0.0–149.0)
VLDL: 13.8 mg/dL (ref 0.0–40.0)

## 2022-03-22 LAB — CBC WITH DIFFERENTIAL/PLATELET
Basophils Absolute: 0 10*3/uL (ref 0.0–0.1)
Basophils Relative: 0.4 % (ref 0.0–3.0)
Eosinophils Absolute: 0 10*3/uL (ref 0.0–0.7)
Eosinophils Relative: 0.7 % (ref 0.0–5.0)
HCT: 40.4 % (ref 36.0–46.0)
Hemoglobin: 13.1 g/dL (ref 12.0–15.0)
Lymphocytes Relative: 48.3 % — ABNORMAL HIGH (ref 12.0–46.0)
Lymphs Abs: 2.2 10*3/uL (ref 0.7–4.0)
MCHC: 32.4 g/dL (ref 30.0–36.0)
MCV: 71.8 fl — ABNORMAL LOW (ref 78.0–100.0)
Monocytes Absolute: 0.2 10*3/uL (ref 0.1–1.0)
Monocytes Relative: 4.5 % (ref 3.0–12.0)
Neutro Abs: 2.1 10*3/uL (ref 1.4–7.7)
Neutrophils Relative %: 46.1 % (ref 43.0–77.0)
Platelets: 212 10*3/uL (ref 150.0–400.0)
RBC: 5.62 Mil/uL — ABNORMAL HIGH (ref 3.87–5.11)
RDW: 18.2 % — ABNORMAL HIGH (ref 11.5–15.5)
WBC: 4.6 10*3/uL (ref 4.0–10.5)

## 2022-03-22 LAB — COMPREHENSIVE METABOLIC PANEL
ALT: 14 U/L (ref 0–35)
AST: 17 U/L (ref 0–37)
Albumin: 4.5 g/dL (ref 3.5–5.2)
Alkaline Phosphatase: 57 U/L (ref 39–117)
BUN: 11 mg/dL (ref 6–23)
CO2: 31 mEq/L (ref 19–32)
Calcium: 9.2 mg/dL (ref 8.4–10.5)
Chloride: 105 mEq/L (ref 96–112)
Creatinine, Ser: 1.09 mg/dL (ref 0.40–1.20)
GFR: 56.57 mL/min — ABNORMAL LOW (ref >60.00)
Glucose, Bld: 108 mg/dL — ABNORMAL HIGH (ref 70–99)
Potassium: 4.3 mEq/L (ref 3.5–5.1)
Sodium: 144 mEq/L (ref 135–145)
Total Bilirubin: 1.2 mg/dL (ref 0.2–1.2)
Total Protein: 7.5 g/dL (ref 6.0–8.3)

## 2022-03-22 LAB — TSH: TSH: 2.4 u[IU]/mL (ref 0.35–5.50)

## 2022-03-22 LAB — HEMOGLOBIN A1C: Hgb A1c MFr Bld: 6 % (ref 4.6–6.5)

## 2022-03-22 NOTE — Patient Instructions (Addendum)
It was great seeing you today   We will follow up with you regarding your lab work   Please let me know if you need anything   

## 2022-03-22 NOTE — Progress Notes (Signed)
Subjective:    Patient ID: Shelby Espinoza, female    DOB: 11-30-1965, 56 y.o.   MRN: [id number]  HPI Patient presents for yearly preventative medicine examination. She is a pleasant 56 year old female who  has a past medical history of GERD (gastroesophageal reflux disease) and Hypothyroidism.  Hypothyroidism-takes Synthroid 50 mcg daily and Cytomel 12.5 mg daily.  She has been controlled for some time on this combination of medication. Lab Results  Component Value Date   TSH 3.48 03/02/2021   All immunizations and health maintenance protocols were reviewed with the patient and needed orders were placed.  Appropriate screening laboratory values were ordered for the patient including screening of hyperlipidemia, renal function and hepatic function.  Medication reconciliation,  past medical history, social history, problem list and allergies were reviewed in detail with the patient  Goals were established with regard to weight loss, exercise, and  diet in compliance with medications Wt Readings from Last 3 Encounters:  03/22/22 185 lb (83.9 kg)  01/17/22 184 lb (83.5 kg)  01/08/22 189 lb (85.7 kg)   She is up to date on routine colon cancer screening and GYN care  Review of Systems  Constitutional: Negative.   HENT: Negative.    Eyes: Negative.   Respiratory: Negative.    Cardiovascular: Negative.   Gastrointestinal: Negative.   Endocrine: Negative.   Genitourinary: Negative.   Musculoskeletal: Negative.   Skin: Negative.   Allergic/Immunologic: Negative.   Neurological: Negative.   Hematological: Negative.   Psychiatric/Behavioral: Negative.     Past Medical History:  Diagnosis Date   GERD (gastroesophageal reflux disease)    Hypothyroidism     Social History   Socioeconomic History   Marital status: Married    Spouse name: Not on file   Number of children: Not on file   Years of education: Not on file   Highest education level: Master's degree (e.g., MA, MS,  MEng, MEd, MSW, MBA)  Occupational History   Not on file  Tobacco Use   Smoking status: Never   Smokeless tobacco: Never  Vaping Use   Vaping Use: Never used  Substance and Sexual Activity   Alcohol use: No   Drug use: No   Sexual activity: Yes    Birth control/protection: None  Other Topics Concern   Not on file  Social History Narrative   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   Three children ( all in [place] )    1 grandchild      She is active in her church    Social Determinants of Health   Financial Resource Strain: [place]  (01/08/2022)   Overall Financial Resource Strain (CARDIA)    Difficulty of Paying Living Expenses: Not hard at all  Food Insecurity: No Food Insecurity (01/08/2022)   Hunger Vital Sign    Worried About Running Out of Food in the Last Year: Never true    [place] in the Last Year: Never true  Transportation Needs: No Transportation Needs (01/08/2022)   PRAPARE - Hydrologist (Medical): No    Lack of Transportation (Non-Medical): No  Physical Activity: Insufficiently Active (01/08/2022)   Exercise Vital Sign    Days of Exercise per Week: 3 days    Minutes of Exercise per Session: 30 min  Stress: No Stress Concern Present (01/08/2022)   [place]    Feeling of Stress :  Not at all  Social Connections: Socially Integrated (01/08/2022)   Social Connection and Isolation Panel [NHANES]    Frequency of Communication with Friends and Family: More than three times a week    Frequency of Social Gatherings with Friends and Family: More than three times a week    Attends Religious Services: More than 4 times per year    Active Member of [employer] or Organizations: Yes    Attends Music therapist: More than 4 times per year    Marital Status: Married  Human resources officer Violence: Not on file    Past Surgical History:   Procedure Laterality Date   APPENDECTOMY     BREAST REDUCTION SURGERY     CESAREAN SECTION     TUBAL LIGATION      Family History  Problem Relation Age of Onset   Alcohol abuse Father    Throat cancer Father    Heart disease Father    Hypertension Father    Breast cancer Mother    Breast cancer Maternal Aunt    Heart disease Paternal Aunt    Colon cancer Neg Hx     No Known Allergies  Current Outpatient Medications on File Prior to Visit  Medication Sig Dispense Refill   EPINEPHrine 0.3 mg/0.3 mL IJ SOAJ injection PLEASE SEE ATTACHED FOR DETAILED DIRECTIONS     levothyroxine (SYNTHROID) 50 MCG tablet TAKE 1 CAPSULE BY MOUTH EVERY DAY BEFORE BREAKFAST 90 tablet 1   liothyronine (CYTOMEL) 25 MCG tablet Take 0.5 tablets (12.5 mcg total) by mouth daily. 30 tablet 1   No current facility-administered medications on file prior to visit.    BP 120/80   Pulse 65   Temp 98 F (36.7 C) (Oral)   Ht 5' 3.5" (1.613 m)   Wt 185 lb (83.9 kg)   LMP 11/01/2016   SpO2 99%   BMI 32.26 kg/m       Objective:   Physical Exam Vitals and nursing note reviewed.  Constitutional:      General: She is not in acute distress.    Appearance: Normal appearance. She is well-developed. She is obese. She is not ill-appearing.  HENT:     Head: Normocephalic and atraumatic.     Right Ear: Tympanic membrane, ear canal and external ear normal. There is no impacted cerumen.     Left Ear: Tympanic membrane, ear canal and external ear normal. There is no impacted cerumen.     Nose: Nose normal. No congestion or rhinorrhea.     Mouth/Throat:     Mouth: Mucous membranes are moist.     Pharynx: Oropharynx is clear. No oropharyngeal exudate or posterior oropharyngeal erythema.  Eyes:     General:        Right eye: No discharge.        Left eye: No discharge.     Extraocular Movements: Extraocular movements intact.     Conjunctiva/sclera: Conjunctivae normal.     Pupils: Pupils are equal, round,  and reactive to light.  Neck:     Thyroid: No thyromegaly.     Vascular: No carotid bruit.     Trachea: No tracheal deviation.  Cardiovascular:     Rate and Rhythm: Normal rate and regular rhythm.     Pulses: Normal pulses.     Heart sounds: Normal heart sounds. No murmur heard.    No friction rub. No gallop.  Pulmonary:     Effort: Pulmonary effort is normal. No respiratory distress.  Breath sounds: Normal breath sounds. No stridor. No wheezing, rhonchi or rales.  Chest:     Chest wall: No tenderness.  Abdominal:     General: Abdomen is flat. Bowel sounds are normal. There is no distension.     Palpations: Abdomen is soft. There is no mass.     Tenderness: There is no abdominal tenderness. There is no right CVA tenderness, left CVA tenderness, guarding or rebound.     Hernia: No hernia is present.  Musculoskeletal:        General: No swelling, tenderness, deformity or signs of injury. Normal range of motion.     Cervical back: Normal range of motion and neck supple.     Right lower leg: No edema.     Left lower leg: No edema.  Lymphadenopathy:     Cervical: No cervical adenopathy.  Skin:    General: Skin is warm and dry.     Coloration: Skin is not jaundiced or pale.     Findings: No bruising, erythema, lesion or rash.  Neurological:     General: No focal deficit present.     Mental Status: She is alert and oriented to person, place, and time.     Cranial Nerves: No cranial nerve deficit.     Sensory: No sensory deficit.     Motor: No weakness.     Coordination: Coordination normal.     Gait: Gait normal.     Deep Tendon Reflexes: Reflexes normal.  Psychiatric:        Mood and Affect: Mood normal.        Behavior: Behavior normal.        Thought Content: Thought content normal.        Judgment: Judgment normal.       Assessment & Plan:  1. Routine general medical examination at a health care facility - Encouraged diet and exercise - Follow up in one year or  sooner if needed - CBC with Differential/Platelet; Future - Comprehensive metabolic panel; Future - Lipid panel; Future - TSH; Future  2. Hypothyroidism, unspecified type - Consider increase in synthroid  - CBC with Differential/Platelet; Future - Comprehensive metabolic panel; Future - Lipid panel; Future - TSH; Future  3. Encounter for screening for HIV  - HIV Antibody (routine testing w rflx); Future  Dorothyann Peng, NP

## 2022-03-23 LAB — HIV ANTIBODY (ROUTINE TESTING W REFLEX): HIV 1&2 Ab, 4th Generation: NONREACTIVE

## 2022-04-05 ENCOUNTER — Other Ambulatory Visit: Payer: Self-pay | Admitting: Adult Health

## 2022-04-13 ENCOUNTER — Telehealth: Payer: Self-pay | Admitting: Adult Health

## 2022-04-13 ENCOUNTER — Encounter: Payer: Self-pay | Admitting: Adult Health

## 2022-04-13 NOTE — Telephone Encounter (Signed)
Please advise 

## 2022-04-13 NOTE — Telephone Encounter (Signed)
Pt states the letter that was written for her job needs a few more details. Will upload letter in [place] for details

## 2022-05-09 ENCOUNTER — Other Ambulatory Visit: Payer: Self-pay | Admitting: Adult Health

## 2022-05-09 DIAGNOSIS — M25469 Effusion, unspecified knee: Secondary | ICD-10-CM

## 2022-05-09 DIAGNOSIS — M25561 Pain in right knee: Secondary | ICD-10-CM

## 2022-05-10 ENCOUNTER — Other Ambulatory Visit: Payer: Self-pay | Admitting: Adult Health

## 2022-05-10 ENCOUNTER — Encounter: Payer: Self-pay | Admitting: Adult Health

## 2022-05-10 MED ORDER — LIOTHYRONINE SODIUM 25 MCG PO TABS: ORAL_TABLET | ORAL | 1 refills | Status: DC

## 2022-05-10 NOTE — Telephone Encounter (Signed)
Please advise 

## 2022-06-11 ENCOUNTER — Encounter: Payer: Self-pay | Admitting: Adult Health

## 2022-06-12 NOTE — Telephone Encounter (Signed)
Please advise 

## 2022-06-13 ENCOUNTER — Encounter: Payer: Self-pay | Admitting: Adult Health

## 2022-06-13 NOTE — Telephone Encounter (Signed)
Letter has been uploaded

## 2022-06-18 ENCOUNTER — Encounter: Payer: Self-pay | Admitting: Adult Health

## 2022-06-19 NOTE — Telephone Encounter (Signed)
Please advise 

## 2022-07-21 ENCOUNTER — Ambulatory Visit
Admission: RE | Admit: 2022-07-21 | Discharge: 2022-07-21 | Disposition: A | Discharge status: Home / Self Care | Payer: Federal, State, Local not specified - PPO | Source: Ambulatory Visit | Attending: Adult Health | Admitting: Adult Health

## 2022-07-21 DIAGNOSIS — M25561 Pain in right knee: Secondary | ICD-10-CM | POA: Diagnosis not present

## 2022-07-23 ENCOUNTER — Encounter: Payer: Self-pay | Admitting: Adult Health

## 2022-07-24 ENCOUNTER — Encounter: Payer: Self-pay | Admitting: Adult Health

## 2022-07-24 ENCOUNTER — Other Ambulatory Visit: Payer: Self-pay | Admitting: Adult Health

## 2022-07-24 DIAGNOSIS — G8929 Other chronic pain: Secondary | ICD-10-CM

## 2022-07-24 NOTE — Telephone Encounter (Signed)
Please advise 

## 2022-07-25 ENCOUNTER — Encounter: Payer: Self-pay | Admitting: Orthopaedic Surgery

## 2022-07-25 ENCOUNTER — Ambulatory Visit (INDEPENDENT_AMBULATORY_CARE_PROVIDER_SITE_OTHER): Payer: Federal, State, Local not specified - PPO | Admitting: Orthopaedic Surgery

## 2022-07-25 VITALS — Ht 64.0 in | Wt 189.0 lb

## 2022-07-25 DIAGNOSIS — M1712 Unilateral primary osteoarthritis, left knee: Secondary | ICD-10-CM | POA: Diagnosis not present

## 2022-07-25 DIAGNOSIS — M1711 Unilateral primary osteoarthritis, right knee: Secondary | ICD-10-CM | POA: Insufficient documentation

## 2022-07-25 NOTE — Progress Notes (Addendum)
Office Visit Note   Patient: Shelby Espinoza           Date of Birth: [birth year]           MRN: [id number] Visit Date: 07/25/2022              Requested by: Dorothyann Peng, NP [place],  [place] [id number] PCP: Dorothyann Peng, NP   Assessment & Plan: Visit Diagnoses:  1. Primary osteoarthritis of right knee     Plan: MRI shows degenerative signal of the medial meniscus with extrusion.  She has moderate chondromalacia as well.  Overall findings are consistent with degenerative joint disease.  I explained that scoping the knee would not likely provide the pain relief that she is looking for.  Other treatment options were reviewed including unloader brace, medications, injections.  Patient not interested in injections.  She would like to try medial unloader brace.  I provided her information knee replacement surgery.  She will think about her options and let us know.  Follow-Up Instructions: No follow-ups on file.   Orders:  No orders of the defined types were placed in this encounter.  No orders of the defined types were placed in this encounter.     Procedures: No procedures performed   Clinical Data: No additional findings.   Subjective: Chief Complaint  Patient presents with   Right Knee - Pain    HPI  Review of Systems  Constitutional: Negative.   HENT: Negative.    Eyes: Negative.   Respiratory: Negative.    Cardiovascular: Negative.   Endocrine: Negative.   Musculoskeletal: Negative.   Neurological: Negative.   Hematological: Negative.   Psychiatric/Behavioral: Negative.    All other systems reviewed and are negative.    Objective: Vital Signs: Ht '5\' 4"'$  (1.626 m)   Wt 189 lb (85.7 kg)   LMP 11/01/2016   BMI 32.44 kg/m   Physical Exam Vitals and nursing note reviewed.  Constitutional:      Appearance: She is well-developed.  HENT:     Head: Atraumatic.     Nose: Nose normal.  Eyes:     Extraocular Movements: Extraocular  movements intact.  Cardiovascular:     Pulses: Normal pulses.  Pulmonary:     Effort: Pulmonary effort is normal.  Abdominal:     Palpations: Abdomen is soft.  Musculoskeletal:     Cervical back: Neck supple.  Skin:    General: Skin is warm.     Capillary Refill: Capillary refill takes less than 2 seconds.  Neurological:     Mental Status: She is alert. Mental status is at baseline.  Psychiatric:        Behavior: Behavior normal.        Thought Content: Thought content normal.        Judgment: Judgment normal.     Ortho Exam Examination of the right knee shows no joint effusion.  Exquisite medial joint line tenderness.  Pain with valgus stress.  She has moderate pain with range of motion.  1+ patellofemoral crepitus.  Specialty Comments:  No specialty comments available.  Imaging: No results found.   PMFS History: Patient Active Problem List   Diagnosis Date Noted   Primary osteoarthritis of left knee 07/25/2022   Viral URI 05/17/2018   Hypothyroidism 05/02/2007   Past Medical History:  Diagnosis Date   GERD (gastroesophageal reflux disease)    Hemorrhoids    Hypothyroidism     Family History  Problem Relation  Age of Onset   Alcohol abuse Father    Throat cancer Father    Heart disease Father    Hypertension Father    Breast cancer Mother    Breast cancer Maternal Aunt    Heart disease Paternal Aunt    Colon cancer Neg Hx     Past Surgical History:  Procedure Laterality Date   APPENDECTOMY     BREAST REDUCTION SURGERY     CESAREAN SECTION     TUBAL LIGATION     Social History   Occupational History   Not on file  Tobacco Use   Smoking status: Never   Smokeless tobacco: Never  Vaping Use   Vaping Use: Never used  Substance and Sexual Activity   Alcohol use: No   Drug use: No   Sexual activity: Yes    Birth control/protection: None

## 2022-07-26 ENCOUNTER — Encounter: Payer: Self-pay | Admitting: Adult Health

## 2022-08-06 ENCOUNTER — Other Ambulatory Visit: Payer: Self-pay

## 2022-08-14 ENCOUNTER — Encounter: Payer: Self-pay | Admitting: Orthopaedic Surgery

## 2022-08-14 NOTE — Pre-Procedure Instructions (Signed)
Surgical Instructions    Your procedure is scheduled on August 29, 2022.  Report to [facility] Main Entrance "A" at 7:40 A.M., then check in with the Admitting office.  Call this number if you have problems the morning of surgery:  [phone]   If you have any questions prior to your surgery date call [phone]: Open Monday-Friday 8am-4pm If you experience any cold or flu symptoms such as cough, fever, chills, shortness of breath, etc. between now and your scheduled surgery, please notify us at the above number     Remember:  Do not eat after midnight the night before your surgery  You may drink clear liquids until 7:10 AM the morning of your surgery.   Clear liquids allowed are: Water, Non-Citrus Juices (without pulp), Carbonated Beverages, Clear Tea, Black Coffee Only (NO MILK, CREAM OR POWDERED CREAMER of any kind), and Gatorade.  Patient Instructions  The night before surgery:  No food after midnight. ONLY clear liquids after midnight  The day of surgery (if you do NOT have diabetes):  Drink ONE (1) Pre-Surgery Clear Ensure by 7:10 AM the morning of surgery. Drink in one sitting. Do not sip.  This drink was given to you during your hospital  pre-op appointment visit.  Nothing else to drink after completing the  Pre-Surgery Clear Ensure.         If you have questions, please contact your surgeon's office.      Take these medicines the morning of surgery with A SIP OF WATER:  levothyroxine (SYNTHROID)   liothyronine (CYTOMEL)   EPINEPHrine - may take if needed     As of today, STOP taking any Aspirin (unless otherwise instructed by your surgeon) Aleve, Naproxen, Ibuprofen, Motrin, Advil, Goody's, BC's, all herbal medications, fish oil, and all vitamins.                     Do NOT Smoke (Tobacco/Vaping) for 24 hours prior to your procedure.  If you use a CPAP at night, you may bring your mask/headgear for your overnight stay.   Contacts, glasses, piercing's,  hearing aid's, dentures or partials may not be worn into surgery, please bring cases for these belongings.    For patients admitted to the hospital, discharge time will be determined by your treatment team.   Patients discharged the day of surgery will not be allowed to drive home, and someone needs to stay with them for 24 hours.  SURGICAL WAITING ROOM VISITATION Patients having surgery or a procedure may have no more than 2 support people in the waiting area - these visitors may rotate.   Children under the age of 25 must have an adult with them who is not the patient. If the patient needs to stay at the hospital during part of their recovery, the visitor guidelines for inpatient rooms apply. Pre-op nurse will coordinate an appropriate time for 1 support person to accompany patient in pre-op.  This support person may not rotate.   Please refer to the [facility] website for the visitor guidelines for Inpatients (after your surgery is over and you are in a regular room).    Special instructions:   [facility]- Preparing For Surgery  Before surgery, you can play an important role. Because skin is not sterile, your skin needs to be as free of germs as possible. You can reduce the number of germs on your skin by washing with CHG (chlorahexidine gluconate) Soap before surgery.  CHG is an antiseptic  cleaner which kills germs and bonds with the skin to continue killing germs even after washing.    Oral Hygiene is also important to reduce your risk of infection.  Remember - BRUSH YOUR TEETH THE MORNING OF SURGERY WITH YOUR REGULAR TOOTHPASTE  Please do not use if you have an allergy to CHG or antibacterial soaps. If your skin becomes reddened/irritated stop using the CHG.  Do not shave (including legs and underarms) for at least 48 hours prior to first CHG shower. It is OK to shave your face.  Please follow these instructions carefully.   Shower the NIGHT BEFORE SURGERY and the MORNING OF  SURGERY  If you chose to wash your hair, wash your hair first as usual with your normal shampoo.  After you shampoo, rinse your hair and body thoroughly to remove the shampoo.  Use CHG Soap as you would any other liquid soap. You can apply CHG directly to the skin and wash gently with a scrungie or a clean washcloth.   Apply the CHG Soap to your body ONLY FROM THE NECK DOWN.  Do not use on open wounds or open sores. Avoid contact with your eyes, ears, mouth and genitals (private parts). Wash Face and genitals (private parts)  with your normal soap.   Wash thoroughly, paying special attention to the area where your surgery will be performed.  Thoroughly rinse your body with warm water from the neck down.  DO NOT shower/wash with your normal soap after using and rinsing off the CHG Soap.  Pat yourself dry with a CLEAN TOWEL.  Wear CLEAN PAJAMAS to bed the night before surgery  Place CLEAN SHEETS on your bed the night before your surgery  DO NOT SLEEP WITH PETS.   Day of Surgery: Take a shower with CHG soap. Do not wear jewelry or makeup Do not wear lotions, powders, perfumes/colognes, or deodorant. Do not shave 48 hours prior to surgery.  Men may shave face and neck. Do not bring valuables to the hospital.  [facility] is not responsible for any belongings or valuables. Do not wear nail polish, gel polish, artificial nails, or any other type of covering on natural nails (fingers and toes) If you have artificial nails or gel coating that need to be removed by a nail salon, please have this removed prior to surgery. Artificial nails or gel coating may interfere with anesthesia's ability to adequately monitor your vital signs.  Wear Clean/Comfortable clothing the morning of surgery Remember to brush your teeth WITH YOUR REGULAR TOOTHPASTE.   Please read over the following fact sheets that you were given.    If you received a COVID test during your pre-op visit  it is requested  that you wear a mask when out in public, stay away from anyone that may not be feeling well and notify your surgeon if you develop symptoms. If you have been in contact with anyone that has tested positive in the last 10 days please notify you surgeon.

## 2022-08-15 ENCOUNTER — Encounter (HOSPITAL_COMMUNITY)
Admission: RE | Admit: 2022-08-15 | Discharge: 2022-08-15 | Disposition: A | Discharge status: Home / Self Care | Payer: Federal, State, Local not specified - PPO | Source: Ambulatory Visit | Attending: Orthopaedic Surgery | Admitting: Orthopaedic Surgery

## 2022-08-15 ENCOUNTER — Other Ambulatory Visit: Payer: Self-pay

## 2022-08-15 ENCOUNTER — Encounter (HOSPITAL_COMMUNITY): Payer: Self-pay

## 2022-08-15 VITALS — BP 129/82 | HR 59 | Temp 97.8°F | Resp 20 | Ht 64.5 in | Wt 187.6 lb

## 2022-08-15 DIAGNOSIS — Z01812 Encounter for preprocedural laboratory examination: Secondary | ICD-10-CM | POA: Diagnosis not present

## 2022-08-15 DIAGNOSIS — Z01818 Encounter for other preprocedural examination: Secondary | ICD-10-CM

## 2022-08-15 DIAGNOSIS — M1711 Unilateral primary osteoarthritis, right knee: Secondary | ICD-10-CM | POA: Insufficient documentation

## 2022-08-15 HISTORY — DX: Unspecified osteoarthritis, unspecified site: M19.90

## 2022-08-15 LAB — BASIC METABOLIC PANEL
Anion gap: 11 (ref 5–15)
BUN: 8 mg/dL (ref 6–20)
CO2: 27 mmol/L (ref 22–32)
Calcium: 9.2 mg/dL (ref 8.9–10.3)
Chloride: 103 mmol/L (ref 98–111)
Creatinine, Ser: 0.89 mg/dL (ref 0.44–1.00)
GFR, Estimated: >60 mL/min (ref >60)
Glucose, Bld: 106 mg/dL — ABNORMAL HIGH (ref 70–99)
Potassium: 4.3 mmol/L (ref 3.5–5.1)
Sodium: 141 mmol/L (ref 135–145)

## 2022-08-15 LAB — CBC
HCT: 39.8 % (ref 36.0–46.0)
Hemoglobin: 13 g/dL (ref 12.0–15.0)
MCH: 23.3 pg — ABNORMAL LOW (ref 26.0–34.0)
MCHC: 32.7 g/dL (ref 30.0–36.0)
MCV: 71.3 fL — ABNORMAL LOW (ref 80.0–100.0)
Platelets: 229 10*3/uL (ref 150–400)
RBC: 5.58 MIL/uL — ABNORMAL HIGH (ref 3.87–5.11)
RDW: 19.1 % — ABNORMAL HIGH (ref 11.5–15.5)
WBC: 5.5 10*3/uL (ref 4.0–10.5)
nRBC: 0 % (ref 0.0–0.2)

## 2022-08-15 LAB — SURGICAL PCR SCREEN

## 2022-08-15 NOTE — Progress Notes (Signed)
PCP - Dorothyann Peng, NP Cardiologist - Denies  PPM/ICD - Denies Device Orders - n/a Rep Notified - n/a  Chest x-ray - n/a EKG - n/a Stress Test - Denies  ECHO - Denies Cardiac Cath - Denies  Sleep Study - Negative sleep study 2021 CPAP - n/a  No DM  Last dose of GLP1 agonist- n/a GLP1 instructions: n/a  Blood Thinner Instructions: n/a Aspirin Instructions: n/a  ERAS Protcol - Yes. Clear liquids until 0740 PRE-SURGERY Ensure or G2- Ensure given to patient at PAT appointment.  COVID TEST- n/a   Anesthesia review: No.   Patient denies shortness of breath, fever, cough and chest pain at PAT appointment   All instructions explained to the patient, with a verbal understanding of the material. Patient agrees to go over the instructions while at home for a better understanding. Patient also instructed to self quarantine after being tested for COVID-19. The opportunity to ask questions was provided.

## 2022-08-16 NOTE — Progress Notes (Signed)
Invalid result from surgical PCR sample from PAT appointment. Will need to be recollected DOS.

## 2022-08-27 ENCOUNTER — Other Ambulatory Visit: Payer: Self-pay | Admitting: Physician Assistant

## 2022-08-27 MED ORDER — OXYCODONE-ACETAMINOPHEN 5-325 MG PO TABS: 1.0000 | ORAL_TABLET | Freq: Four times a day (QID) | ORAL | 0 refills | Status: DC | PRN

## 2022-08-27 MED ORDER — ONDANSETRON HCL 4 MG PO TABS: 4.0000 mg | ORAL_TABLET | Freq: Three times a day (TID) | ORAL | 0 refills | Status: DC | PRN

## 2022-08-27 MED ORDER — ASPIRIN 81 MG PO TBEC: 81.0000 mg | DELAYED_RELEASE_TABLET | Freq: Two times a day (BID) | ORAL | 0 refills | Status: DC

## 2022-08-27 MED ORDER — METHOCARBAMOL 750 MG PO TABS: 750.0000 mg | ORAL_TABLET | Freq: Two times a day (BID) | ORAL | 2 refills | Status: DC | PRN

## 2022-08-27 MED ORDER — DOCUSATE SODIUM 100 MG PO CAPS: 100.0000 mg | ORAL_CAPSULE | Freq: Every day | ORAL | 2 refills | Status: DC | PRN

## 2022-08-28 ENCOUNTER — Telehealth: Payer: Self-pay

## 2022-08-28 ENCOUNTER — Other Ambulatory Visit: Payer: Self-pay

## 2022-08-28 MED ORDER — TRANEXAMIC ACID 1000 MG/10ML IV SOLN
2000.0000 mg | INTRAVENOUS | Status: DC
  Filled 2022-08-28: qty 20

## 2022-08-28 NOTE — Telephone Encounter (Signed)
Pts surgery that is scheduled for tomorrow was denied by [facility] as not being medically necessary. I scanned the denial rationale into her chart and can be seen under the media tab. P2P was an option so I went ahead and set it up for today @ 2:00. They will be calling your cell #.   Ref# on case is [id number]

## 2022-08-28 NOTE — Telephone Encounter (Signed)
Got it approved.

## 2022-08-29 ENCOUNTER — Other Ambulatory Visit: Payer: Self-pay

## 2022-08-29 ENCOUNTER — Ambulatory Visit (HOSPITAL_COMMUNITY): Payer: Federal, State, Local not specified - PPO

## 2022-08-29 ENCOUNTER — Encounter (HOSPITAL_COMMUNITY): Payer: Self-pay | Admitting: Orthopaedic Surgery

## 2022-08-29 ENCOUNTER — Observation Stay (HOSPITAL_COMMUNITY)
Admission: RE | Admit: 2022-08-29 | Discharge: 2022-08-30 | Disposition: A | Discharge status: Home / Self Care | Payer: Federal, State, Local not specified - PPO | Source: Ambulatory Visit | Attending: Orthopaedic Surgery | Admitting: Orthopaedic Surgery

## 2022-08-29 ENCOUNTER — Ambulatory Visit (HOSPITAL_COMMUNITY): Payer: Federal, State, Local not specified - PPO | Admitting: Critical Care Medicine

## 2022-08-29 ENCOUNTER — Encounter (HOSPITAL_COMMUNITY)
Admission: RE | Disposition: A | Discharge status: Home / Self Care | Payer: Self-pay | Source: Ambulatory Visit | Attending: Orthopaedic Surgery

## 2022-08-29 DIAGNOSIS — Z96651 Presence of right artificial knee joint: Secondary | ICD-10-CM

## 2022-08-29 DIAGNOSIS — Z7982 Long term (current) use of aspirin: Secondary | ICD-10-CM | POA: Insufficient documentation

## 2022-08-29 DIAGNOSIS — Z01818 Encounter for other preprocedural examination: Secondary | ICD-10-CM

## 2022-08-29 DIAGNOSIS — M1711 Unilateral primary osteoarthritis, right knee: Secondary | ICD-10-CM | POA: Diagnosis not present

## 2022-08-29 DIAGNOSIS — G8918 Other acute postprocedural pain: Secondary | ICD-10-CM | POA: Diagnosis not present

## 2022-08-29 DIAGNOSIS — Z471 Aftercare following joint replacement surgery: Secondary | ICD-10-CM | POA: Diagnosis not present

## 2022-08-29 DIAGNOSIS — M1712 Unilateral primary osteoarthritis, left knee: Secondary | ICD-10-CM | POA: Diagnosis not present

## 2022-08-29 DIAGNOSIS — E039 Hypothyroidism, unspecified: Secondary | ICD-10-CM | POA: Diagnosis not present

## 2022-08-29 DIAGNOSIS — Z79899 Other long term (current) drug therapy: Secondary | ICD-10-CM | POA: Insufficient documentation

## 2022-08-29 HISTORY — PX: TOTAL KNEE ARTHROPLASTY: SHX125

## 2022-08-29 LAB — SURGICAL PCR SCREEN
MRSA, PCR: NEGATIVE
Staphylococcus aureus: NEGATIVE

## 2022-08-29 SURGERY — ARTHROPLASTY, KNEE, TOTAL
Anesthesia: Monitor Anesthesia Care | Site: Knee | Laterality: Right

## 2022-08-29 MED ORDER — FENTANYL CITRATE (PF) 250 MCG/5ML IJ SOLN
INTRAMUSCULAR | Status: AC
  Filled 2022-08-29: qty 5

## 2022-08-29 MED ORDER — ONDANSETRON HCL 4 MG PO TABS: 4.0000 mg | ORAL_TABLET | Freq: Four times a day (QID) | ORAL | Status: DC | PRN

## 2022-08-29 MED ORDER — FENTANYL CITRATE (PF) 100 MCG/2ML IJ SOLN: 100.0000 ug | Freq: Once | INTRAMUSCULAR | Status: AC

## 2022-08-29 MED ORDER — TRANEXAMIC ACID-NACL 1000-0.7 MG/100ML-% IV SOLN
1000.0000 mg | Freq: Once | INTRAVENOUS | Status: AC
  Administered 2022-08-29: 1000 mg via INTRAVENOUS
  Filled 2022-08-29: qty 100

## 2022-08-29 MED ORDER — OXYCODONE HCL 5 MG/5ML PO SOLN: 5.0000 mg | Freq: Once | ORAL | Status: DC | PRN

## 2022-08-29 MED ORDER — ROPIVACAINE HCL 5 MG/ML IJ SOLN
INTRAMUSCULAR | Status: DC | PRN
  Administered 2022-08-29: 20 mL via PERINEURAL

## 2022-08-29 MED ORDER — SODIUM CHLORIDE 0.9 % IR SOLN
Status: DC | PRN
  Administered 2022-08-29: 1000 mL

## 2022-08-29 MED ORDER — VANCOMYCIN HCL 1000 MG IV SOLR
INTRAVENOUS | Status: DC | PRN
  Administered 2022-08-29: 1000 mg via TOPICAL

## 2022-08-29 MED ORDER — DEXAMETHASONE SODIUM PHOSPHATE 10 MG/ML IJ SOLN
10.0000 mg | Freq: Once | INTRAMUSCULAR | Status: AC
  Administered 2022-08-30: 10 mg via INTRAVENOUS
  Filled 2022-08-29: qty 1

## 2022-08-29 MED ORDER — MENTHOL 3 MG MT LOZG: 1.0000 | LOZENGE | OROMUCOSAL | Status: DC | PRN

## 2022-08-29 MED ORDER — BUPIVACAINE IN DEXTROSE 0.75-8.25 % IT SOLN
INTRATHECAL | Status: DC | PRN
  Administered 2022-08-29: 2 mL via INTRATHECAL

## 2022-08-29 MED ORDER — PROPOFOL 10 MG/ML IV BOLUS
INTRAVENOUS | Status: DC | PRN
  Administered 2022-08-29 (×3): 10 mg via INTRAVENOUS

## 2022-08-29 MED ORDER — KETOROLAC TROMETHAMINE 15 MG/ML IJ SOLN
15.0000 mg | Freq: Four times a day (QID) | INTRAMUSCULAR | Status: DC
  Administered 2022-08-29 – 2022-08-30 (×3): 15 mg via INTRAVENOUS
  Filled 2022-08-29 (×3): qty 1

## 2022-08-29 MED ORDER — OXYCODONE HCL 5 MG PO TABS: 5.0000 mg | ORAL_TABLET | Freq: Once | ORAL | Status: DC | PRN

## 2022-08-29 MED ORDER — DOCUSATE SODIUM 100 MG PO CAPS
100.0000 mg | ORAL_CAPSULE | Freq: Two times a day (BID) | ORAL | Status: DC
  Administered 2022-08-29 – 2022-08-30 (×2): 100 mg via ORAL
  Filled 2022-08-29 (×2): qty 1

## 2022-08-29 MED ORDER — MIDAZOLAM HCL 2 MG/2ML IJ SOLN: 2.0000 mg | Freq: Once | INTRAMUSCULAR | Status: AC

## 2022-08-29 MED ORDER — BUPIVACAINE-MELOXICAM ER 400-12 MG/14ML IJ SOLN
INTRAMUSCULAR | Status: AC
  Filled 2022-08-29: qty 1

## 2022-08-29 MED ORDER — PHENYLEPHRINE HCL-NACL 20-0.9 MG/250ML-% IV SOLN
INTRAVENOUS | Status: DC | PRN
  Administered 2022-08-29: 10 ug/min via INTRAVENOUS

## 2022-08-29 MED ORDER — ASPIRIN 81 MG PO CHEW
81.0000 mg | CHEWABLE_TABLET | Freq: Two times a day (BID) | ORAL | Status: DC
  Administered 2022-08-29 – 2022-08-30 (×2): 81 mg via ORAL
  Filled 2022-08-29 (×2): qty 1

## 2022-08-29 MED ORDER — LEVOTHYROXINE SODIUM 50 MCG PO TABS
50.0000 ug | ORAL_TABLET | Freq: Every day | ORAL | Status: DC
  Administered 2022-08-30: 50 ug via ORAL
  Filled 2022-08-29: qty 1

## 2022-08-29 MED ORDER — CEFAZOLIN SODIUM-DEXTROSE 2-4 GM/100ML-% IV SOLN
2.0000 g | Freq: Four times a day (QID) | INTRAVENOUS | Status: AC
  Administered 2022-08-29 (×2): 2 g via INTRAVENOUS
  Filled 2022-08-29 (×2): qty 100

## 2022-08-29 MED ORDER — FENTANYL CITRATE (PF) 100 MCG/2ML IJ SOLN
INTRAMUSCULAR | Status: AC
  Administered 2022-08-29: 100 ug via INTRAVENOUS
  Filled 2022-08-29: qty 2

## 2022-08-29 MED ORDER — PHENOL 1.4 % MT LIQD: 1.0000 | OROMUCOSAL | Status: DC | PRN

## 2022-08-29 MED ORDER — CHLORHEXIDINE GLUCONATE 0.12 % MT SOLN
OROMUCOSAL | Status: AC
  Administered 2022-08-29: 15 mL
  Filled 2022-08-29: qty 15

## 2022-08-29 MED ORDER — ACETAMINOPHEN 325 MG PO TABS: 325.0000 mg | ORAL_TABLET | Freq: Four times a day (QID) | ORAL | Status: DC | PRN

## 2022-08-29 MED ORDER — OXYCODONE HCL 5 MG PO TABS
10.0000 mg | ORAL_TABLET | ORAL | Status: DC | PRN
  Administered 2022-08-29 (×2): 10 mg via ORAL

## 2022-08-29 MED ORDER — PROMETHAZINE HCL 25 MG/ML IJ SOLN: 6.2500 mg | INTRAMUSCULAR | Status: DC | PRN

## 2022-08-29 MED ORDER — HYDROMORPHONE HCL 1 MG/ML IJ SOLN: 0.5000 mg | INTRAMUSCULAR | Status: DC | PRN

## 2022-08-29 MED ORDER — METOCLOPRAMIDE HCL 5 MG/ML IJ SOLN: 5.0000 mg | Freq: Three times a day (TID) | INTRAMUSCULAR | Status: DC | PRN

## 2022-08-29 MED ORDER — ACETAMINOPHEN 500 MG PO TABS
1000.0000 mg | ORAL_TABLET | Freq: Four times a day (QID) | ORAL | Status: DC
  Administered 2022-08-29 – 2022-08-30 (×3): 1000 mg via ORAL
  Filled 2022-08-29 (×3): qty 2

## 2022-08-29 MED ORDER — 0.9 % SODIUM CHLORIDE (POUR BTL) OPTIME
TOPICAL | Status: DC | PRN
  Administered 2022-08-29: 1000 mL

## 2022-08-29 MED ORDER — CEFAZOLIN SODIUM-DEXTROSE 2-4 GM/100ML-% IV SOLN
2.0000 g | INTRAVENOUS | Status: AC
  Administered 2022-08-29: 2 g via INTRAVENOUS
  Filled 2022-08-29: qty 100

## 2022-08-29 MED ORDER — METHOCARBAMOL 1000 MG/10ML IJ SOLN: 500.0000 mg | Freq: Four times a day (QID) | INTRAVENOUS | Status: DC | PRN

## 2022-08-29 MED ORDER — METOCLOPRAMIDE HCL 5 MG PO TABS: 5.0000 mg | ORAL_TABLET | Freq: Three times a day (TID) | ORAL | Status: DC | PRN

## 2022-08-29 MED ORDER — VANCOMYCIN HCL 1000 MG IV SOLR
INTRAVENOUS | Status: AC
  Filled 2022-08-29: qty 20

## 2022-08-29 MED ORDER — LACTATED RINGERS IV SOLN: INTRAVENOUS | Status: DC

## 2022-08-29 MED ORDER — TRANEXAMIC ACID-NACL 1000-0.7 MG/100ML-% IV SOLN
1000.0000 mg | INTRAVENOUS | Status: AC
  Administered 2022-08-29: 1000 mg via INTRAVENOUS
  Filled 2022-08-29: qty 100

## 2022-08-29 MED ORDER — HYDROMORPHONE HCL 1 MG/ML IJ SOLN
0.2500 mg | INTRAMUSCULAR | Status: DC | PRN
  Administered 2022-08-29 (×2): 0.5 mg via INTRAVENOUS

## 2022-08-29 MED ORDER — BUPIVACAINE-MELOXICAM ER 400-12 MG/14ML IJ SOLN
INTRAMUSCULAR | Status: DC | PRN
  Administered 2022-08-29: 400 mg

## 2022-08-29 MED ORDER — OXYCODONE HCL 5 MG PO TABS
5.0000 mg | ORAL_TABLET | ORAL | Status: DC | PRN
  Administered 2022-08-30 (×2): 10 mg via ORAL
  Filled 2022-08-29 (×4): qty 2

## 2022-08-29 MED ORDER — METHOCARBAMOL 500 MG PO TABS
500.0000 mg | ORAL_TABLET | Freq: Four times a day (QID) | ORAL | Status: DC | PRN
  Administered 2022-08-29 – 2022-08-30 (×3): 500 mg via ORAL
  Filled 2022-08-29 (×3): qty 1

## 2022-08-29 MED ORDER — FERROUS SULFATE 325 (65 FE) MG PO TABS
325.0000 mg | ORAL_TABLET | Freq: Three times a day (TID) | ORAL | Status: DC
  Administered 2022-08-29 – 2022-08-30 (×2): 325 mg via ORAL
  Filled 2022-08-29 (×2): qty 1

## 2022-08-29 MED ORDER — OXYCODONE HCL ER 10 MG PO T12A
10.0000 mg | EXTENDED_RELEASE_TABLET | Freq: Two times a day (BID) | ORAL | Status: DC
  Administered 2022-08-29 – 2022-08-30 (×2): 10 mg via ORAL
  Filled 2022-08-29 (×2): qty 1

## 2022-08-29 MED ORDER — PRONTOSAN WOUND IRRIGATION OPTIME
TOPICAL | Status: DC | PRN
  Administered 2022-08-29: 250 mL via TOPICAL

## 2022-08-29 MED ORDER — MIDAZOLAM HCL 2 MG/2ML IJ SOLN
INTRAMUSCULAR | Status: AC
  Administered 2022-08-29: 2 mg via INTRAVENOUS
  Filled 2022-08-29: qty 2

## 2022-08-29 MED ORDER — PROPOFOL 500 MG/50ML IV EMUL
INTRAVENOUS | Status: DC | PRN
  Administered 2022-08-29: 50 ug/kg/min via INTRAVENOUS

## 2022-08-29 MED ORDER — TRANEXAMIC ACID 1000 MG/10ML IV SOLN
INTRAVENOUS | Status: DC | PRN
  Administered 2022-08-29: 2000 mg via TOPICAL

## 2022-08-29 MED ORDER — MIDAZOLAM HCL 2 MG/2ML IJ SOLN
INTRAMUSCULAR | Status: AC
  Filled 2022-08-29: qty 2

## 2022-08-29 MED ORDER — HYDROMORPHONE HCL 1 MG/ML IJ SOLN
INTRAMUSCULAR | Status: AC
  Filled 2022-08-29: qty 1

## 2022-08-29 MED ORDER — ONDANSETRON HCL 4 MG/2ML IJ SOLN: 4.0000 mg | Freq: Four times a day (QID) | INTRAMUSCULAR | Status: DC | PRN

## 2022-08-29 MED ORDER — SODIUM CHLORIDE 0.9 % IV SOLN: INTRAVENOUS | Status: DC

## 2022-08-29 MED ORDER — POVIDONE-IODINE 10 % EX SWAB
2.0000 | Freq: Once | CUTANEOUS | Status: AC
  Administered 2022-08-29: 2 via TOPICAL

## 2022-08-29 MED ORDER — ONDANSETRON HCL 4 MG/2ML IJ SOLN
INTRAMUSCULAR | Status: DC | PRN
  Administered 2022-08-29: 4 mg via INTRAVENOUS

## 2022-08-29 SURGICAL SUPPLY — 81 items
ALCOHOL 70% 16 OZ (MISCELLANEOUS) ×1 IMPLANT
BAG COUNTER SPONGE SURGICOUNT (BAG) IMPLANT
BAG DECANTER FOR FLEXI CONT (MISCELLANEOUS) ×1 IMPLANT
BANDAGE ESMARK 6X9 LF (GAUZE/BANDAGES/DRESSINGS) IMPLANT
BLADE SAG 18X100X1.27 (BLADE) ×1 IMPLANT
BNDG ESMARK 6X9 LF (GAUZE/BANDAGES/DRESSINGS)
BOWL SMART MIX CTS (DISPOSABLE) ×1 IMPLANT
CLSR STERI-STRIP ANTIMIC 1/2X4 (GAUZE/BANDAGES/DRESSINGS) ×2 IMPLANT
COMP FEM PS KNEE NRW 7 RT (Joint) ×1 IMPLANT
COMPONENT FEM PS KNEE NRW 7 RT (Joint) IMPLANT
COOLER ICEMAN CLASSIC (MISCELLANEOUS) ×1 IMPLANT
COVER SURGICAL LIGHT HANDLE (MISCELLANEOUS) ×1 IMPLANT
CUFF TOURN SGL QUICK 34 (TOURNIQUET CUFF) ×1
CUFF TOURN SGL QUICK 42 (TOURNIQUET CUFF) IMPLANT
CUFF TRNQT CYL 34X4.125X (TOURNIQUET CUFF) ×1 IMPLANT
DERMABOND ADVANCED .7 DNX12 (GAUZE/BANDAGES/DRESSINGS) ×1 IMPLANT
DRAPE EXTREMITY T 121X128X90 (DISPOSABLE) ×1 IMPLANT
DRAPE HALF SHEET 40X57 (DRAPES) ×1 IMPLANT
DRAPE INCISE IOBAN 66X45 STRL (DRAPES) ×1 IMPLANT
DRAPE ORTHO SPLIT 77X108 STRL (DRAPES) ×2
DRAPE POUCH INSTRU U-SHP 10X18 (DRAPES) ×1 IMPLANT
DRAPE SURG ORHT 6 SPLT 77X108 (DRAPES) ×2 IMPLANT
DRAPE U-SHAPE 47X51 STRL (DRAPES) ×2 IMPLANT
DRSG AQUACEL AG ADV 3.5X 6 (GAUZE/BANDAGES/DRESSINGS) IMPLANT
DRSG AQUACEL AG ADV 3.5X10 (GAUZE/BANDAGES/DRESSINGS) ×1 IMPLANT
DURAPREP 26ML APPLICATOR (WOUND CARE) ×3 IMPLANT
ELECT CAUTERY BLADE 6.4 (BLADE) ×1 IMPLANT
ELECT REM PT RETURN 9FT ADLT (ELECTROSURGICAL) ×1
ELECTRODE REM PT RTRN 9FT ADLT (ELECTROSURGICAL) ×1 IMPLANT
GLOVE BIOGEL PI IND STRL 7.0 (GLOVE) ×2 IMPLANT
GLOVE BIOGEL PI IND STRL 7.5 (GLOVE) ×5 IMPLANT
GLOVE ECLIPSE 7.0 STRL STRAW (GLOVE) ×3 IMPLANT
GLOVE SKINSENSE STRL SZ7.5 (GLOVE) ×3 IMPLANT
GLOVE SURG SYN 7.5  E (GLOVE) ×2
GLOVE SURG SYN 7.5 E (GLOVE) ×2 IMPLANT
GLOVE SURG SYN 7.5 PF PI (GLOVE) ×2 IMPLANT
GLOVE SURG UNDER LTX SZ7.5 (GLOVE) ×2 IMPLANT
GLOVE SURG UNDER POLY LF SZ7 (GLOVE) ×2 IMPLANT
GOWN STRL REIN XL XLG (GOWN DISPOSABLE) ×1 IMPLANT
GOWN STRL REUS W/ TWL LRG LVL3 (GOWN DISPOSABLE) ×1 IMPLANT
GOWN STRL REUS W/TWL LRG LVL3 (GOWN DISPOSABLE) ×1
HANDPIECE INTERPULSE COAX TIP (DISPOSABLE) ×1
HDLS TROCR DRIL PIN KNEE 75 (PIN) ×1
HOOD PEEL AWAY FLYTE STAYCOOL (MISCELLANEOUS) ×2 IMPLANT
IMPL PATELLA METAL SZ32X10 (Joint) IMPLANT
INSERT TIB ASF PERS CD6-7 RT (Insert) IMPLANT
KIT BASIN OR (CUSTOM PROCEDURE TRAY) ×1 IMPLANT
KIT TURNOVER KIT B (KITS) ×1 IMPLANT
MANIFOLD NEPTUNE II (INSTRUMENTS) ×1 IMPLANT
MARKER SKIN DUAL TIP RULER LAB (MISCELLANEOUS) ×2 IMPLANT
NDL SPNL 18GX3.5 QUINCKE PK (NEEDLE) ×1 IMPLANT
NEEDLE SPNL 18GX3.5 QUINCKE PK (NEEDLE) ×1 IMPLANT
NS IRRIG 1000ML POUR BTL (IV SOLUTION) ×1 IMPLANT
PACK TOTAL JOINT (CUSTOM PROCEDURE TRAY) ×1 IMPLANT
PAD ARMBOARD 7.5X6 YLW CONV (MISCELLANEOUS) ×2 IMPLANT
PAD COLD SHLDR WRAP-ON (PAD) ×1 IMPLANT
PIN DRILL HDLS TROCAR 75 4PK (PIN) IMPLANT
SAW OSC TIP CART 19.5X105X1.3 (SAW) ×1 IMPLANT
SCREW FEMALE HEX FIX 25X2.5 (ORTHOPEDIC DISPOSABLE SUPPLIES) IMPLANT
SET HNDPC FAN SPRY TIP SCT (DISPOSABLE) ×1 IMPLANT
SOLUTION PRONTOSAN WOUND 350ML (IRRIGATION / IRRIGATOR) ×1 IMPLANT
STAPLER VISISTAT 35W (STAPLE) IMPLANT
STEM TIB PS KNEE D 0D RT (Stem) IMPLANT
SUCTION FRAZIER HANDLE 10FR (MISCELLANEOUS) ×1
SUCTION TUBE FRAZIER 10FR DISP (MISCELLANEOUS) ×1 IMPLANT
SUT ETHILON 2 0 FS 18 (SUTURE) IMPLANT
SUT MNCRL AB 3-0 PS2 18 (SUTURE) IMPLANT
SUT MNCRL AB 3-0 PS2 27 (SUTURE) IMPLANT
SUT VIC AB 0 CT1 27 (SUTURE) ×2
SUT VIC AB 0 CT1 27XBRD ANBCTR (SUTURE) ×2 IMPLANT
SUT VIC AB 1 CTX 27 (SUTURE) ×3 IMPLANT
SUT VIC AB 2-0 CT1 27 (SUTURE) ×4
SUT VIC AB 2-0 CT1 TAPERPNT 27 (SUTURE) ×4 IMPLANT
SYR 50ML LL SCALE MARK (SYRINGE) ×2 IMPLANT
TASP GUIDE TIB PS 10 DISP (ORTHOPEDIC DISPOSABLE SUPPLIES) IMPLANT
TOWEL GREEN STERILE (TOWEL DISPOSABLE) ×1 IMPLANT
TOWEL GREEN STERILE FF (TOWEL DISPOSABLE) ×1 IMPLANT
TRAY CATH 16FR W/PLASTIC CATH (SET/KITS/TRAYS/PACK) IMPLANT
TUBE SUCT ARGYLE STRL (TUBING) IMPLANT
UNDERPAD 30X36 HEAVY ABSORB (UNDERPADS AND DIAPERS) ×1 IMPLANT
YANKAUER SUCT BULB TIP NO VENT (SUCTIONS) ×2 IMPLANT

## 2022-08-29 NOTE — Discharge Instructions (Signed)

## 2022-08-29 NOTE — H&P (Signed)
PREOPERATIVE H&P  Chief Complaint: right knee osteoarthritis  HPI: Shelby Espinoza is a 56 y.o. female who presents for surgical treatment of right knee osteoarthritis.  She denies any changes in medical history.  Past Medical History:  Diagnosis Date   Arthritis    GERD (gastroesophageal reflux disease)    Hemorrhoids    Hypothyroidism    Past Surgical History:  Procedure Laterality Date   APPENDECTOMY  1985   BREAST REDUCTION SURGERY Bilateral [place]  2022   TUBAL LIGATION  1998   Social History   Socioeconomic History   Marital status: Married    Spouse name: Not on file   Number of children: Not on file   Years of education: Not on file   Highest education level: Master's degree (e.g., MA, MS, MEng, MEd, MSW, MBA)  Occupational History   Not on file  Tobacco Use   Smoking status: Never   Smokeless tobacco: Never  Vaping Use   Vaping Use: Never used  Substance and Sexual Activity   Alcohol use: No   Drug use: No   Sexual activity: Yes    Birth control/protection: None  Other Topics Concern   Not on file  Social History Narrative   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   Three children ( all in [place] )    1 grandchild      She is active in her church    Social Determinants of Health   Financial Resource Strain: [place]  (01/08/2022)   Overall Financial Resource Strain (CARDIA)    Difficulty of Paying Living Expenses: Not hard at all  Food Insecurity: No Food Insecurity (01/08/2022)   Hunger Vital Sign    Worried About Running Out of Food in the Last Year: Never true    [place] in the Last Year: Never true  Transportation Needs: No Transportation Needs (01/08/2022)   PRAPARE - Hydrologist (Medical): No    Lack of Transportation (Non-Medical): No  Physical Activity: Insufficiently Active (01/08/2022)   Exercise Vital Sign    Days of Exercise  per Week: 3 days    Minutes of Exercise per Session: 30 min  Stress: No Stress Concern Present (01/08/2022)   [place]    Feeling of Stress : Not at all  Social Connections: [place] (01/08/2022)   Social Connection and Isolation Panel [NHANES]    Frequency of Communication with Friends and Family: More than three times a week    Frequency of Social Gatherings with Friends and Family: More than three times a week    Attends Religious Services: More than 4 times per year    Active Member of [employer] or Organizations: Yes    Attends Music therapist: More than 4 times per year    Marital Status: Married   Family History  Problem Relation Age of Onset   Alcohol abuse Father    Throat cancer Father    Heart disease Father    Hypertension Father    Breast cancer Mother    Breast cancer Maternal Aunt    Heart disease Paternal Aunt    Colon cancer Neg Hx    Allergies  Allergen Reactions   Bee Venom Hives and Swelling   Prior to Admission medications   Medication Sig Start Date End Date  Taking? Authorizing Provider  aspirin EC 81 MG tablet Take 1 tablet (81 mg total) by mouth 2 (two) times daily. To be taken after surgery to prevent blood clots 08/27/22 08/27/23  Aundra Dubin, PA-C  cholecalciferol (VITAMIN D3) 25 MCG (1000 UNIT) tablet Take 1,000 Units by mouth daily.   Yes [provider]  docusate sodium (COLACE) 100 MG capsule Take 1 capsule (100 mg total) by mouth daily as needed. 08/27/22 08/27/23  Aundra Dubin, PA-C  EPINEPHrine 0.3 mg/0.3 mL IJ SOAJ injection Inject 0.3 mg into the muscle as needed for anaphylaxis. 09/08/21  Yes [provider]  levothyroxine (SYNTHROID) 50 MCG tablet TAKE 1 CAPSULE BY MOUTH EVERY DAY BEFORE BREAKFAST 03/01/22  Yes Nafziger, Tommi Rumps, NP  liothyronine (CYTOMEL) 25 MCG tablet TAKE 0.5 TABLETS BY MOUTH DAILY. 05/10/22  Yes Nafziger, Tommi Rumps, NP   methocarbamol (ROBAXIN-750) 750 MG tablet Take 1 tablet (750 mg total) by mouth 2 (two) times daily as needed for muscle spasms. 08/27/22   Aundra Dubin, PA-C  Omega-3 Fatty Acids (FISH OIL PO) Take 1 capsule by mouth daily.   Yes [provider]  ondansetron (ZOFRAN) 4 MG tablet Take 1 tablet (4 mg total) by mouth every 8 (eight) hours as needed for nausea or vomiting. 08/27/22   Aundra Dubin, PA-C  oxyCODONE-acetaminophen (PERCOCET) 5-325 MG tablet Take 1-2 tablets by mouth every 6 (six) hours as needed. To be taken after surgery 08/27/22   Aundra Dubin, PA-C  trolamine salicylate (ASPERCREME) 10 % cream Apply 1 Application topically as needed for muscle pain.   Yes [provider]  TURMERIC PO Take 1 capsule by mouth daily.   Yes [provider]  Zinc 50 MG TABS Take 50 mg by mouth daily.   Yes [provider]     Positive ROS: All other systems have been reviewed and were otherwise negative with the exception of those mentioned in the HPI and as above.  Physical Exam: General: Alert, no acute distress Cardiovascular: No pedal edema Respiratory: No cyanosis, no use of accessory musculature GI: abdomen soft Skin: No lesions in the area of chief complaint Neurologic: Sensation intact distally Psychiatric: Patient is competent for consent with normal mood and affect Lymphatic: no lymphedema  MUSCULOSKELETAL: exam stable  Assessment: right knee osteoarthritis  Plan: Plan for Procedure(s): RIGHT TOTAL KNEE ARTHROPLASTY  The risks benefits and alternatives were discussed with the patient including but not limited to the risks of nonoperative treatment, versus surgical intervention including infection, bleeding, nerve injury,  blood clots, cardiopulmonary complications, morbidity, mortality, among others, and they were willing to proceed.   Eduard Roux, MD 08/29/2022 9:44 AM

## 2022-08-29 NOTE — Op Note (Signed)
Total Knee Arthroplasty Procedure Note  Preoperative diagnosis: Right knee osteoarthritis  Postoperative diagnosis:same  Operative findings: Grade 4 chondromalacia medial femoral condyle Complex tear of medial meniscus Good bone quality  Operative procedure: Right total knee arthroplasty. CPT [phone]  Surgeon: N. Eduard Roux, MD  Assist: Madalyn Rob, PA-C; necessary for the timely completion of procedure and due to complexity of procedure.  Anesthesia: Spinal, regional  Tourniquet time: see anesthesia record  Implants used: Zimmer persona uncemented Femur: CR 7 narrow Tibia: D Patella: 32 mm Polyethylene: 11 mm, MC  Indication: Shelby Espinoza is a 56 y.o. year old female with a history of knee pain. Having failed conservative management, the patient elected to proceed with a total knee arthroplasty.  We have reviewed the risk and benefits of the surgery and they elected to proceed after voicing understanding.  Procedure:  After informed consent was obtained and understanding of the risk were voiced including but not limited to bleeding, infection, damage to surrounding structures including nerves and vessels, blood clots, leg length inequality and the failure to achieve desired results, the operative extremity was marked with verbal confirmation of the patient in the holding area.   The patient was then brought to the operating room and transported to the operating room table in the supine position.  A tourniquet was applied to the operative extremity around the upper thigh. The operative limb was then prepped and draped in the usual sterile fashion and preoperative antibiotics were administered.  A time out was performed prior to the start of surgery confirming the correct extremity, preoperative antibiotic administration, as well as team members, implants and instruments available for the case. Correct surgical site was also confirmed with preoperative radiographs. The  limb was then elevated for exsanguination and the tourniquet was inflated. A midline incision was made and a standard medial parapatellar approach was performed.  The patella was prepared and sized to a 32 mm.  A cover was placed on the patella for protection from retractors.  We then turned our attention to the femur. Posterior cruciate ligament was sacrificed. Start site was drilled in the femur and the intramedullary distal femoral cutting guide was placed, set at 5 degrees valgus, taking 10 mm of distal resection. The distal cut was made. Osteophytes were then removed.   Next, the proximal tibial cutting guide was placed with appropriate slope, varus/valgus alignment and depth of resection. The proximal tibial cut was made taking 4 mm off the low side. Gap blocks were then used to assess the extension gap and alignment, and appropriate soft tissue releases were performed. Attention was turned back to the femur, which was sized using the sizing guide to a size 7 narrow. Appropriate rotation of the femoral component was determined using epicondylar axis, Whiteside's line, and assessing the flexion gap under ligament tension. The appropriate size 4-in-1 cutting block was placed and cuts were made.  Posterior femoral osteophytes and uncapped bone were then removed with the curved osteotome.  Trial components were placed, and stability was checked in full extension, mid-flexion, and deep flexion. Proper tibial rotation was determined and marked.  The patella tracked well without a lateral release. Trial components were then removed and tibial preparation performed.  The tibia was sized for a size D component.  The bony surfaces were irrigated with a pulse lavage and then dried. Final components were placed.  The stability of the construct was re-evaluated throughout a range of motion and found to be acceptable. The trial  liner was removed, the knee was copiously irrigated, and the knee was re-evaluated for any  excess bone debris. The real polyethylene liner, 11 mm thick, was inserted and checked to ensure the locking mechanism had engaged appropriately. The tourniquet was deflated and hemostasis was achieved. The wound was irrigated with normal saline.  One gram of vancomycin powder was placed in the surgical bed.  Topical 0.25% bupivacaine and meloxicam was placed in the joint for postoperative pain.  Capsular closure was performed with a #1 vicryl, subcutaneous fat closed with a 0 vicryl suture, then subcutaneous tissue closed with interrupted 2.0 vicryl suture. The skin was then closed with a 3.0 monocryl and steri strips. A sterile dressing was applied.  The patient was awakened in the operating room and taken to recovery in stable condition. All sponge, needle, and instrument counts were correct at the end of the case.  Tawanna Cooler was necessary for opening, closing, retracting, limb positioning and overall facilitation and completion of the surgery.  Position: supine  Complications: none.  Time Out: performed   Drains/Packing: none  Estimated blood loss: minimal  Returned to Recovery Room: in good condition.   Antibiotics: yes   Mechanical VTE (DVT) Prophylaxis: sequential compression devices, TED thigh-high  Chemical VTE (DVT) Prophylaxis: aspirin  Fluid Replacement  Crystalloid: see anesthesia record Blood: none  FFP: none   Specimens Removed: 1 to pathology   Sponge and Instrument Count Correct? yes   PACU: portable radiograph - knee AP and Lateral   Plan/RTC: Return in 2 weeks for wound check.   Weight Bearing/Load Lower Extremity: full   Implant Name Type Inv. Item Serial No. Manufacturer Lot No. LRB No. Used Action  INSERT TIB ASF PERS CD6-7 RT - [id number] Insert INSERT TIB ASF PERS CD6-7 RT  [employer]IMMER [employer]ON(ORTH,TRAU,BIO,SG) [id number] Right 1 Implanted  IMPL PATELLA METAL SZ32X10 - [id number] Joint IMPL PATELLA METAL [id number]  [employer](ORTH,TRAU,BIO,SG) [id number] Right  1 Implanted  STEM TIB PS KNEE D 0D RT - [id number] Stem STEM TIB PS KNEE D 0D RT  ZIMMER [employer]ON(ORTH,TRAU,BIO,SG) [id number] Right 1 Implanted  COMP FEM PS KNEE NRW 7 RT - [id number] Joint COMP FEM PS KNEE NRW 7 RT  ZIMMER [employer]ON(ORTH,TRAU,BIO,SG) [id number] Right 1 Implanted    N. Eduard Roux, MD [facility] 1:15 PM

## 2022-08-29 NOTE — Progress Notes (Signed)
Received patient to the unit. Oriented patient to the unit and room. Made comfortable. Assessed and addressed pain. Placed call bell with in reach. No other needs are noted.

## 2022-08-29 NOTE — Anesthesia Postprocedure Evaluation (Signed)
Anesthesia Post Note  Patient: Shelby Espinoza  Procedure(s) Performed: RIGHT TOTAL KNEE ARTHROPLASTY (Right: Knee)     Patient location during evaluation: PACU Anesthesia Type: Regional and Spinal Level of consciousness: awake and alert Pain management: pain level controlled Vital Signs Assessment: post-procedure vital signs reviewed and stable Respiratory status: spontaneous breathing, nonlabored ventilation and respiratory function stable Cardiovascular status: blood pressure returned to baseline and stable Postop Assessment: no apparent nausea or vomiting Anesthetic complications: no   No notable events documented.  Last Vitals:  Vitals:   08/29/22 1515 08/29/22 1530  BP: 113/78 119/81  Pulse: (!) 49 (!) 54  Resp: 16 13  Temp:    SpO2: 99% 100%    Last Pain:  Vitals:   08/29/22 1515  TempSrc:   PainSc: Asleep                 Lynda Rainwater

## 2022-08-29 NOTE — Evaluation (Addendum)
Physical Therapy Evaluation Patient Details Name: Shelby Espinoza MRN: [id number] DOB: 02-Feb-1966 Today's Date: 08/29/2022  History of Present Illness  Pt is a 56 y.o.F who presents s/p R TKA 08/29/2022. Significant PMH: none.  Clinical Impression  Pt admitted s/p procedure listed above. Presents with RLE weakness, decreased ROM, and gait abnormalities. Pt requiring min assist for functional mobility and performing stand pivot transfer from bed to chair using RW. Further gait deferred due to dizziness and nausea. Suspect good progress tomorrow given adequate pain control/management. Will need further gait and stair training home prior to d/c.     Recommendations for follow up therapy are one component of a multi-disciplinary discharge planning process, led by the attending physician.  Recommendations may be updated based on patient status, additional functional criteria and insurance authorization.  Follow Up Recommendations Follow physician's recommendations for discharge plan and follow up therapies      Assistance Recommended at Discharge PRN  Patient can return home with the following       Equipment Recommendations None recommended by PT (pt has needed DME)  Recommendations for Other Services       Functional Status Assessment Patient has had a recent decline in their functional status and demonstrates the ability to make significant improvements in function in a reasonable and predictable amount of time.     Precautions / Restrictions Precautions Precautions: Fall Restrictions Weight Bearing Restrictions: No      Mobility  Bed Mobility Overal bed mobility: Needs Assistance Bed Mobility: Supine to Sit     Supine to sit: Min assist     General bed mobility comments: Assist for RLE negotiation    Transfers Overall transfer level: Needs assistance Equipment used: Rolling walker (2 wheels) Transfers: Sit to/from Stand, Bed to chair/wheelchair/BSC Sit to Stand: Min  assist Stand pivot transfers: Min assist         General transfer comment: Assist to rise, cues for sequencing/direction to pivot over to chair. Minimal WB through RLE    Ambulation/Gait                  Stairs            Wheelchair Mobility    Modified Rankin (Stroke Patients Only)       Balance Overall balance assessment: Needs assistance Sitting-balance support: Feet supported Sitting balance-Leahy Scale: Fair     Standing balance support: Bilateral upper extremity supported Standing balance-Leahy Scale: Poor Standing balance comment: reliant on RW                             Pertinent Vitals/Pain Pain Assessment Pain Assessment: 0-10 Pain Score: 6  Pain Location: R knee Pain Descriptors / Indicators: Operative site guarding, Grimacing Pain Intervention(s): Limited activity within patient's tolerance, Monitored during session, Patient requesting pain meds-RN notified    Home Living Family/patient expects to be discharged to:: Private residence Living Arrangements: Spouse/significant other;Parent Available Help at Discharge: Family Type of Home: House Home Access: Stairs to enter   Technical brewer of Steps: 4 (through garage)   Home Layout: Able to live on main level with bedroom/bathroom Home Equipment: Conservation officer, nature (2 wheels);Cane - single point;Other (comment) (CPM; already delivered)      Prior Function Prior Level of Function : Independent/Modified Independent             Mobility Comments: works remotely       Journalist, newspaper  Extremity/Trunk Assessment   Upper Extremity Assessment Upper Extremity Assessment: Overall WFL for tasks assessed    Lower Extremity Assessment Lower Extremity Assessment: RLE deficits/detail RLE Deficits / Details: s/p TKA, grossly 1/5    Cervical / Trunk Assessment Cervical / Trunk Assessment: Normal  Communication   Communication: No difficulties  Cognition  Arousal/Alertness: Awake/alert Behavior During Therapy: WFL for tasks assessed/performed Overall Cognitive Status: Within Functional Limits for tasks assessed                                          General Comments      Exercises Total Joint Exercises Ankle Circles/Pumps: Both, 20 reps, Supine Quad Sets: Right, 5 reps, Supine Long Arc Quad: AAROM, Right, 5 reps, Seated   Assessment/Plan    PT Assessment Patient needs continued PT services  PT Problem List Decreased strength;Decreased range of motion;Decreased activity tolerance;Decreased balance;Decreased mobility;Pain       PT Treatment Interventions DME instruction;Gait training;Stair training;Therapeutic activities;Functional mobility training;Therapeutic exercise;Balance training;Patient/family education    PT Goals (Current goals can be found in the Care Plan section)  Acute Rehab PT Goals Patient Stated Goal: to walk PT Goal Formulation: With patient Time For Goal Achievement: 09/12/22 Potential to Achieve Goals: Good    Frequency 7X/week     Co-evaluation               AM-PAC PT "6 Clicks" Mobility  Outcome Measure Help needed turning from your back to your side while in a flat bed without using bedrails?: A Little Help needed moving from lying on your back to sitting on the side of a flat bed without using bedrails?: A Little Help needed moving to and from a bed to a chair (including a wheelchair)?: A Little Help needed standing up from a chair using your arms (e.g., wheelchair or bedside chair)?: A Little Help needed to walk in hospital room?: A Lot Help needed climbing 3-5 steps with a railing? : Total 6 Click Score: 15    End of Session Equipment Utilized During Treatment: Gait belt Activity Tolerance: Patient tolerated treatment well Patient left: in chair;with call bell/phone within reach Nurse Communication: Mobility status PT Visit Diagnosis: Pain;Difficulty in walking, not  elsewhere classified (R26.2) Pain - Right/Left: Right Pain - part of body: Knee    Time: 2952-8413 PT Time Calculation (min) (ACUTE ONLY): 21 min   Charges:   PT Evaluation $PT Eval Low Complexity: 1 Low          Wyona Almas, PT, DPT Acute Rehabilitation Services Office [phone]   Deno Etienne 08/29/2022, 5:19 PM

## 2022-08-29 NOTE — Anesthesia Procedure Notes (Signed)
Spinal  Patient location during procedure: OR Start time: 08/29/2022 11:49 AM End time: 08/29/2022 11:54 AM Reason for block: surgical anesthesia Staffing Performed: anesthesiologist  Anesthesiologist: Lynda Rainwater, MD Performed by: Lynda Rainwater, MD Authorized by: Lynda Rainwater, MD   Preanesthetic Checklist Completed: patient identified, IV checked, site marked, risks and benefits discussed, surgical consent, monitors and equipment checked, pre-op evaluation and timeout performed Spinal Block Patient position: sitting Prep: DuraPrep Patient monitoring: heart rate, cardiac monitor, continuous pulse ox and blood pressure Approach: midline Location: L3-4 Injection technique: single-shot Needle Needle type: Sprotte  Needle gauge: 24 G Needle length: 9 cm Assessment Sensory level: T4 Events: CSF return

## 2022-08-29 NOTE — Transfer of Care (Signed)
Immediate Anesthesia Transfer of Care Note  Patient: Shelby Espinoza  Procedure(s) Performed: RIGHT TOTAL KNEE ARTHROPLASTY (Right: Knee)  Patient Location: PACU  Anesthesia Type:Regional and Spinal  Level of Consciousness: awake and alert   Airway & Oxygen Therapy: Patient Spontanous Breathing and Patient connected to face mask oxygen  Post-op Assessment: Report given to RN and Post -op Vital signs reviewed and stable  Post vital signs: Reviewed and stable  Last Vitals:  Vitals Value Taken Time  BP 133/83 08/29/22 1400  Temp    Pulse 58 08/29/22 1402  Resp 10 08/29/22 1402  SpO2 100 % 08/29/22 1402  Vitals shown include unvalidated device data.  Last Pain:  Vitals:   08/29/22 1105  TempSrc:   PainSc: 0-No pain         Complications: No notable events documented.

## 2022-08-29 NOTE — Anesthesia Procedure Notes (Signed)
Anesthesia Regional Block: Adductor canal block   Pre-Anesthetic Checklist: , timeout performed,  Correct Patient, Correct Site, Correct Laterality,  Correct Procedure, Correct Position, site marked,  Risks and benefits discussed,  Surgical consent,  Pre-op evaluation,  At surgeon's request and post-op pain management  Laterality: Right  Prep: chloraprep       Needles:  Injection technique: Single-shot  Needle Type: Stimiplex     Needle Length: 9cm  Needle Gauge: 21     Additional Needles:   Procedures:,,,, ultrasound used (permanent image in chart),,    Narrative:  Start time: 08/29/2022 11:11 AM End time: 08/29/2022 11:16 AM Injection made incrementally with aspirations every 5 mL.  Performed by: Personally  Anesthesiologist: Lynda Rainwater, MD

## 2022-08-29 NOTE — Anesthesia Preprocedure Evaluation (Signed)
Anesthesia Evaluation  Patient identified by MRN, date of birth, ID band Patient awake    Reviewed: Allergy & Precautions, H&P , NPO status , Patient's Chart, lab work & pertinent test results  Airway Mallampati: II  TM Distance: >3 FB Neck ROM: Full    Dental no notable dental hx.    Pulmonary neg pulmonary ROS   Pulmonary exam normal breath sounds clear to auscultation       Cardiovascular negative cardio ROS Normal cardiovascular exam Rhythm:Regular Rate:Normal     Neuro/Psych negative neurological ROS  negative psych ROS   GI/Hepatic Neg liver ROS,GERD  ,,  Endo/Other  Hypothyroidism    Renal/GU negative Renal ROS  negative genitourinary   Musculoskeletal  (+) Arthritis , Osteoarthritis,    Abdominal  (+) + obese  Peds negative pediatric ROS (+)  Hematology negative hematology ROS (+)   Anesthesia Other Findings   Reproductive/Obstetrics negative OB ROS                             Anesthesia Physical Anesthesia Plan  ASA: 2  Anesthesia Plan: MAC and Regional   Post-op Pain Management: Regional block* and Minimal or no pain anticipated   Induction: Intravenous  PONV Risk Score and Plan: 2 and Ondansetron, Midazolam and Treatment may vary due to age or medical condition  Airway Management Planned: Simple Face Mask  Additional Equipment:   Intra-op Plan:   Post-operative Plan:   Informed Consent: I have reviewed the patients History and Physical, chart, labs and discussed the procedure including the risks, benefits and alternatives for the proposed anesthesia with the patient or authorized representative who has indicated his/her understanding and acceptance.     Dental advisory given  Plan Discussed with: CRNA  Anesthesia Plan Comments:        Anesthesia Quick Evaluation

## 2022-08-30 DIAGNOSIS — Z96651 Presence of right artificial knee joint: Secondary | ICD-10-CM | POA: Diagnosis not present

## 2022-08-30 DIAGNOSIS — E039 Hypothyroidism, unspecified: Secondary | ICD-10-CM | POA: Diagnosis not present

## 2022-08-30 DIAGNOSIS — Z79899 Other long term (current) drug therapy: Secondary | ICD-10-CM | POA: Diagnosis not present

## 2022-08-30 DIAGNOSIS — Z7982 Long term (current) use of aspirin: Secondary | ICD-10-CM | POA: Diagnosis not present

## 2022-08-30 DIAGNOSIS — M1711 Unilateral primary osteoarthritis, right knee: Secondary | ICD-10-CM | POA: Diagnosis not present

## 2022-08-30 LAB — CBC
HCT: 34.9 % — ABNORMAL LOW (ref 36.0–46.0)
Hemoglobin: 11.6 g/dL — ABNORMAL LOW (ref 12.0–15.0)
MCH: 23.6 pg — ABNORMAL LOW (ref 26.0–34.0)
MCHC: 33.2 g/dL (ref 30.0–36.0)
MCV: 70.9 fL — ABNORMAL LOW (ref 80.0–100.0)
Platelets: 221 10*3/uL (ref 150–400)
RBC: 4.92 MIL/uL (ref 3.87–5.11)
RDW: 18.5 % — ABNORMAL HIGH (ref 11.5–15.5)
WBC: 10.6 10*3/uL — ABNORMAL HIGH (ref 4.0–10.5)
nRBC: 0 % (ref 0.0–0.2)

## 2022-08-30 NOTE — Progress Notes (Signed)
Physical Therapy Treatment Patient Details Name: Shelby Espinoza MRN: [id number] DOB: [birth year] Today's Date: 08/30/2022   History of Present Illness Pt is a 56 y.o.F who presents s/p R TKA 08/29/2022. Significant PMH: none.    PT Comments    Pt with excellent mobility progression. Supervision bed mobility, transfers, and ambulation 150' with RW. Ascend/descend 3 steps with L rail min assist. Reviewed HEP.    Recommendations for follow up therapy are one component of a multi-disciplinary discharge planning process, led by the attending physician.  Recommendations may be updated based on patient status, additional functional criteria and insurance authorization.  Follow Up Recommendations  Follow physician's recommendations for discharge plan and follow up therapies     Assistance Recommended at Discharge PRN  Patient can return home with the following     Equipment Recommendations  None recommended by PT    Recommendations for Other Services       Precautions / Restrictions Precautions Precautions: Fall;Knee Precaution Comments: no pillow under knee Restrictions Weight Bearing Restrictions: Yes RLE Weight Bearing: Weight bearing as tolerated     Mobility  Bed Mobility Overal bed mobility: Needs Assistance Bed Mobility: Supine to Sit, Sit to Supine     Supine to sit: Supervision Sit to supine: Supervision   General bed mobility comments: HOB elevated, increased time    Transfers Overall transfer level: Needs assistance Equipment used: Rolling walker (2 wheels) Transfers: Sit to/from Stand Sit to Stand: Supervision                Ambulation/Gait Ambulation/Gait assistance: Supervision Gait Distance (Feet): 150 Feet Assistive device: Rolling walker (2 wheels) Gait Pattern/deviations: Step-to pattern, Step-through pattern, Decreased stride length, Antalgic, Decreased weight shift to right Gait velocity: decreased Gait velocity interpretation: <1.31  ft/sec, indicative of household ambulator       Stairs Stairs: Yes Stairs assistance: Min assist Stair Management: One rail Left, Step to pattern Number of Stairs: 3 General stair comments: cues for sequencing   Wheelchair Mobility    Modified Rankin (Stroke Patients Only)       Balance Overall balance assessment: Mild deficits observed, not formally tested                                          Cognition Arousal/Alertness: Awake/alert Behavior During Therapy: WFL for tasks assessed/performed Overall Cognitive Status: Within Functional Limits for tasks assessed                                          Exercises Total Joint Exercises Ankle Circles/Pumps: AROM, Both, 10 reps Quad Sets: AROM, Right, 10 reps Heel Slides: AAROM, Right, 5 reps    General Comments        Pertinent Vitals/Pain Pain Assessment Pain Assessment: Faces Faces Pain Scale: Hurts a little bit Pain Location: R knee Pain Descriptors / Indicators: Operative site guarding, Grimacing Pain Intervention(s): Premedicated before session, Monitored during session    Home Living Family/patient expects to be discharged to:: Private residence Living Arrangements: Spouse/significant other;Parent Available Help at Discharge: Family Type of Home: House Home Access: Stairs to enter   Technical brewer of Steps: 4 STE through garage   Home Layout: Able to live on main level with bedroom/bathroom Home Equipment: Conservation officer, nature (2 wheels);Cane - single point;Other (comment)  Additional Comments: CPM already delivered    Prior Function            PT Goals (current goals can now be found in the care plan section) Acute Rehab PT Goals Patient Stated Goal: home Progress towards PT goals: Progressing toward goals    Frequency    7X/week      PT Plan Current plan remains appropriate    Co-evaluation              AM-PAC PT "6 Clicks" Mobility    Outcome Measure  Help needed turning from your back to your side while in a flat bed without using bedrails?: A Little Help needed moving from lying on your back to sitting on the side of a flat bed without using bedrails?: A Little Help needed moving to and from a bed to a chair (including a wheelchair)?: A Little Help needed standing up from a chair using your arms (e.g., wheelchair or bedside chair)?: A Little Help needed to walk in hospital room?: A Little Help needed climbing 3-5 steps with a railing? : A Little 6 Click Score: 18    End of Session Equipment Utilized During Treatment: Gait belt Activity Tolerance: Patient tolerated treatment well Patient left: in bed;with call bell/phone within reach;with family/visitor present Nurse Communication: Mobility status PT Visit Diagnosis: Pain;Difficulty in walking, not elsewhere classified (R26.2) Pain - Right/Left: Right Pain - part of body: Knee     Time: 1610-9604 PT Time Calculation (min) (ACUTE ONLY): 27 min  Charges:  $Gait Training: 8-22 mins $Therapeutic Exercise: 8-22 mins                     Lorrin Goodell, PT  Office # [phone] Pager [phone]    Lorriane Shire 08/30/2022, 9:25 AM

## 2022-08-30 NOTE — Progress Notes (Signed)
Subjective: 1 Day Post-Op Procedure(s) (LRB): RIGHT TOTAL KNEE ARTHROPLASTY (Right) Patient reports pain as mild.    Objective: Vital signs in last 24 hours: Temp:  [97.7 F (36.5 C)-98.6 F (37 C)] 98.4 F (36.9 C) (11/30 0729) Pulse Rate:  [48-76] 76 (11/30 0729) Resp:  [11-20] 18 (11/30 0729) BP: (101-135)/(69-111) 123/73 (11/30 0729) SpO2:  [96 %-100 %] 99 % (11/30 0729) Weight:  [83.9 kg] 83.9 kg (11/29 1009)  Intake/Output from previous day: 11/29 0701 - 11/30 0700 In: 900 [I.V.:800; IV Piggyback:100] Out: 825 [Urine:775; Blood:50] Intake/Output this shift: No intake/output data recorded.  No results for input(s): "HGB" in the last 72 hours. No results for input(s): "WBC", "RBC", "HCT", "PLT" in the last 72 hours. No results for input(s): "NA", "K", "CL", "CO2", "BUN", "CREATININE", "GLUCOSE", "CALCIUM" in the last 72 hours. No results for input(s): "LABPT", "INR" in the last 72 hours.  Neurologically intact Neurovascular intact Sensation intact distally Intact pulses distally Dorsiflexion/Plantar flexion intact Incision: scant drainage No cellulitis present Compartment soft   Assessment/Plan: 1 Day Post-Op Procedure(s) (LRB): RIGHT TOTAL KNEE ARTHROPLASTY (Right) Advance diet Up with therapy D/C IV fluids Discharge home with home health after second PT session as long as she is cleared by PT WBAT RLE   Anticipated LOS equal to or greater than 2 midnights due to - Age 57 and older with one or more of the following:  - Obesity  - Expected need for hospital services (PT, OT, Nursing) required for safe  discharge  - Anticipated need for postoperative skilled nursing care or inpatient rehab  - Active co-morbidities: None OR   - Unanticipated findings during/Post Surgery: None  - Patient is a high risk of re-admission due to: None   Aundra Dubin 08/30/2022, 7:41 AM

## 2022-08-30 NOTE — Plan of Care (Signed)
Pt doing well. Pt and husband given D/C instructions with verbal understanding. Rx's were sent to the pharmacy by MD. Pt's incision is clean and dry with no sign of infection. Pt's IV was removed prior to D/C. Pt D/C'd home via wheelchair per MD order. Pt is stable @ D/C and has no other needs at this time. Tupac Jeffus, RN  

## 2022-08-30 NOTE — Evaluation (Signed)
Occupational Therapy Evaluation Patient Details Name: Shelby Espinoza MRN: [id number] DOB: 1966/03/04 Today's Date: 08/30/2022   History of Present Illness Pt is a 56 y.o.F who presents s/p R TKA 08/29/2022. Significant PMH: none.   Clinical Impression   Pt admitted as above. Pt & her husband were educated in role of OT following recent surgery, Pt was Min A bed mobility to advance R LE when getting OOB but mod I getting back into bed, discussed picking up area rugs etc and clearing floors at home to avoid tripping hazzards, issued handout and discussed A/E PRN , pt state that her daughter got her a Little [place] for PRN use. Pt ambulated into bathroom with supervision and transferred on/off toilet with min guard assist. Discussed tub vs shower use and transfers and LB dressing techniques with or w/o a/e. Also educated pt/spouse on home set up in kitchen to place frequently used items on counter top for ease of use. Pt will have necessary assist from her husband and mother at home. Will sign off OT at this time.      Recommendations for follow up therapy are one component of a multi-disciplinary discharge planning process, led by the attending physician.  Recommendations may be updated based on patient status, additional functional criteria and insurance authorization.   Follow Up Recommendations  No OT follow up     Assistance Recommended at Discharge PRN  Patient can return home with the following A little help with walking and/or transfers;A little help with bathing/dressing/bathroom;Assistance with cooking/housework;Assist for transportation    Functional Status Assessment  Patient has not had a recent decline in their functional status  Equipment Recommendations  Other (comment) (Pt denies needs for DME)    Recommendations for Other Services       Precautions / Restrictions Precautions Precautions: Fall Restrictions Weight Bearing Restrictions: Yes RLE Weight Bearing: Weight  bearing as tolerated      Mobility Bed Mobility Overal bed mobility: Needs Assistance Bed Mobility: Supine to Sit     Supine to sit: Min guard     General bed mobility comments: Assist for RLE negotiation    Transfers Overall transfer level: Needs assistance Equipment used: Rolling walker (2 wheels) Transfers: Sit to/from Stand, Bed to chair/wheelchair/BSC Sit to Stand: Supervision, Min guard Stand pivot transfers: Supervision, Min guard   General transfer comment: No hands on assist to rise, vc's for sequencing with RW during toilet transfer      Balance Overall balance assessment: Mild deficits observed, not formally tested     ADL either performed or assessed with clinical judgement   ADL Overall ADL's : Needs assistance/impaired Eating/Feeding: Independent;Sitting;Bed level   Grooming: Wash/dry hands;Standing;Supervision/safety;Min guard   Upper Body Bathing: Set up;Sitting   Lower Body Bathing: Min guard;Sit to/from stand;Sitting/lateral leans   Upper Body Dressing : Set up;Sitting   Lower Body Dressing: Set up;Min guard;Sit to/from stand;Sitting/lateral leans Lower Body Dressing Details (indicate cue type and reason): Don/doff socks Toilet Transfer: Min guard;Ambulation;Regular Toilet;Rolling walker (2 wheels) Toilet Transfer Details (indicate cue type and reason): Discussed home set up and proper use of RW PRN if no countertop is avaiable to steady herself with Toileting- Clothing Manipulation and Hygiene: Modified independent;Sitting/lateral lean   Tub/ Shower Transfer: Walk-in shower;Min guard;Ambulation;Rolling walker (2 wheels) Tub/Shower Transfer Details (indicate cue type and reason): Discussed use of walk in shower at home with able and DME to use to sit PRN. Pt plans to take bird bathes until she feels ready to get into  tub. Pt and pt husband educated verbally on transfers and DME use PRN Functional mobility during ADLs: Supervision/safety;Min  guard;Rolling walker (2 wheels) General ADL Comments: Pt & her husband were educated in role of OT following recent surgery, Pt was Min A bed mobility to advance R LE when getting OOB but mod I getting back into bed, discussed picking up area rugs etc and clearing floors at home to avoid tripping hazzards, issued handout and discussed A/E PRN , pt state that her daughter got her a [place] for PRN use. Pt ambulated into bathroom with supervision and transferred on/off toilet with min guard assist. Discussed tub vs shower use, transfers and LB dressing techniques with or w/o a/e.[place] Kitchen Also educated pt/spouse on home set up in kitchen to place frequently used items on counter top for ease of use. Pt will have necessary assist from her husband and mother at home. Will sign off OT at this time.     Vision Patient Visual Report: No change from baseline Vision Assessment?: No apparent visual deficits     Perception     Praxis      Pertinent Vitals/Pain Pain Assessment Pain Assessment: Faces Faces Pain Scale: Hurts a little bit Pain Location: R knee Pain Descriptors / Indicators: Operative site guarding, Grimacing Pain Intervention(s): Limited activity within patient's tolerance, Monitored during session, Repositioned     Hand Dominance Right   Extremity/Trunk Assessment Upper Extremity Assessment Upper Extremity Assessment: Overall WFL for tasks assessed   Lower Extremity Assessment Lower Extremity Assessment: Defer to PT evaluation   Cervical / Trunk Assessment Cervical / Trunk Assessment: Normal   Communication Communication Communication: No difficulties   Cognition Arousal/Alertness: Awake/alert Behavior During Therapy: WFL for tasks assessed/performed Overall Cognitive Status: Within Functional Limits for tasks assessed                    Home Living Family/patient expects to be discharged to:: Private residence Living Arrangements: Spouse/significant  other;Parent Available Help at Discharge: Family Type of Home: House Home Access: Stairs to enter Technical brewer of Steps: 4 STE through garage   Home Layout: Able to live on main level with bedroom/bathroom     Bathroom Shower/Tub: Tub/shower unit;Walk-in shower   Bathroom Toilet: Standard     Home Equipment: Conservation officer, nature (2 wheels);Cane - single point;Other (comment)   Additional Comments: CPM already delivered      Prior Functioning/Environment Prior Level of Function : Independent/Modified Independent     Mobility Comments: works remotely ADLs Comments: Independent        OT Problem List: Pain;Decreased activity tolerance;Decreased knowledge of use of DME or AE      OT Treatment/Interventions:      OT Goals(Current goals can be found in the care plan section) Acute Rehab OT Goals Patient Stated Goal: D/c home with family PRN assist OT Goal Formulation: All assessment and education complete, DC therapy Time For Goal Achievement: 08/30/22 Potential to Achieve Goals: Good  OT Frequency:  Eval only       AM-PAC OT "6 Clicks" Daily Activity     Outcome Measure Help from another person eating meals?: None Help from another person taking care of personal grooming?: A Little Help from another person toileting, which includes using toliet, bedpan, or urinal?: A Little Help from another person bathing (including washing, rinsing, drying)?: A Little Help from another person to put on and taking off regular upper body clothing?: None Help from another person to put on and taking  off regular lower body clothing?: A Little 6 Click Score: 20   End of Session Equipment Utilized During Treatment: Rolling walker (2 wheels) Nurse Communication: Mobility status  Activity Tolerance: Patient tolerated treatment well;No increased pain Patient left: in bed;with call bell/phone within reach;with family/visitor present  OT Visit Diagnosis: Pain;Other abnormalities of  gait and mobility (R26.89) Pain - Right/Left: Right Pain - part of body: Knee                Time: 2179-8102 OT Time Calculation (min): 13 min Charges:  OT General Charges $OT Visit: 1 Visit OT Evaluation $OT Eval Low Complexity: 1 Low  See Beharry Beth Dixon, OTR/L 08/30/2022, 8:42 AM

## 2022-08-30 NOTE — Discharge Summary (Signed)
Patient ID: Shelby Espinoza MRN: [id number] DOB/AGE: [birth year] 56 y.o.  Admit date: 08/29/2022 Discharge date: 08/30/2022  Admission Diagnoses:  Principal Problem:   Primary osteoarthritis of right knee Active Problems:   Status post total right knee replacement   Discharge Diagnoses:  Same  Past Medical History:  Diagnosis Date   Arthritis    GERD (gastroesophageal reflux disease)    Hemorrhoids    Hypothyroidism     Surgeries: Procedure(s): RIGHT TOTAL KNEE ARTHROPLASTY on 08/29/2022   Consultants:   Discharged Condition: Improved  Hospital Course: Shelby Espinoza is an 56 y.o. female who was admitted 08/29/2022 for operative treatment ofPrimary osteoarthritis of right knee. Patient has severe unremitting pain that affects sleep, daily activities, and work/hobbies. After pre-op clearance the patient was taken to the operating room on 08/29/2022 and underwent  Procedure(s): RIGHT TOTAL KNEE ARTHROPLASTY.    Patient was given perioperative antibiotics:  Anti-infectives (From admission, onward)    Start     Dose/Rate Route Frequency Ordered Stop   08/29/22 1700  ceFAZolin (ANCEF) IVPB 2g/100 mL premix        2 g 200 mL/hr over 30 Minutes Intravenous Every 6 hours 08/29/22 1602 08/29/22 2325   08/29/22 1236  vancomycin (VANCOCIN) powder  Status:  Discontinued          As needed 08/29/22 1236 08/29/22 1356   08/29/22 1000  ceFAZolin (ANCEF) IVPB 2g/100 mL premix        2 g 200 mL/hr over 30 Minutes Intravenous On call to O.R. 08/29/22 0946 08/29/22 1200        Patient was given sequential compression devices, early ambulation, and chemoprophylaxis to prevent DVT.  Patient benefited maximally from hospital stay and there were no complications.    Recent vital signs: Patient Vitals for the past 24 hrs:  BP Temp Temp src Pulse Resp SpO2 Height Weight  08/30/22 0729 123/73 98.4 F (36.9 C) Oral 76 18 99 % -- --  08/30/22 0351 118/73 98.6 F (37 C) Oral 73 18 98 % --  --  08/29/22 2324 118/69 98.6 F (37 C) Oral 74 18 97 % -- --  08/29/22 1942 135/83 97.8 F (36.6 C) Oral 62 18 100 % -- --  08/29/22 1551 (!) 125/111 98.4 F (36.9 C) Oral 60 20 98 % -- --  08/29/22 1530 119/81 97.8 F (36.6 C) -- (!) 54 13 100 % -- --  08/29/22 1515 113/78 -- -- (!) 49 16 99 % -- --  08/29/22 1500 115/76 -- -- (!) 48 16 100 % -- --  08/29/22 1445 114/80 -- -- (!) 51 18 99 % -- --  08/29/22 1430 109/74 -- -- (!) 48 20 98 % -- --  08/29/22 1415 101/80 -- -- (!) 53 20 98 % -- --  08/29/22 1400 133/83 97.7 F (36.5 C) -- (!) 54 11 99 % -- --  08/29/22 1120 125/83 -- -- 68 14 98 % -- --  08/29/22 1115 113/80 -- -- 65 12 98 % -- --  08/29/22 1110 127/82 -- -- 61 17 99 % -- --  08/29/22 1105 122/79 -- -- (!) 57 15 98 % -- --  08/29/22 1100 -- -- -- 64 -- 97 % -- --  08/29/22 1009 130/83 98 F (36.7 C) Oral 66 16 96 % 5' 4.5" (1.638 m) 83.9 kg     Recent laboratory studies: No results for input(s): "WBC", "HGB", "HCT", "PLT", "NA", "K", "CL", "CO2", "BUN", "CREATININE", "GLUCOSE", "INR", "  CALCIUM" in the last 72 hours.  Invalid input(s): "PT", "2"   Discharge Medications:   Allergies as of 08/30/2022       Reactions   Bee Venom Hives, Swelling        Medication List     STOP taking these medications    FISH OIL PO       TAKE these medications    aspirin EC 81 MG tablet Take 1 tablet (81 mg total) by mouth 2 (two) times daily. To be taken after surgery to prevent blood clots   cholecalciferol 25 MCG (1000 UNIT) tablet Commonly known as: VITAMIN D3 Take 1,000 Units by mouth daily.   docusate sodium 100 MG capsule Commonly known as: Colace Take 1 capsule (100 mg total) by mouth daily as needed.   EPINEPHrine 0.3 mg/0.3 mL Soaj injection Commonly known as: EPI-PEN Inject 0.3 mg into the muscle as needed for anaphylaxis.   levothyroxine 50 MCG tablet Commonly known as: SYNTHROID TAKE 1 CAPSULE BY MOUTH EVERY DAY BEFORE BREAKFAST    liothyronine 25 MCG tablet Commonly known as: CYTOMEL TAKE 0.5 TABLETS BY MOUTH DAILY.   methocarbamol 750 MG tablet Commonly known as: Robaxin-750 Take 1 tablet (750 mg total) by mouth 2 (two) times daily as needed for muscle spasms.   ondansetron 4 MG tablet Commonly known as: Zofran Take 1 tablet (4 mg total) by mouth every 8 (eight) hours as needed for nausea or vomiting.   oxyCODONE-acetaminophen 5-325 MG tablet Commonly known as: Percocet Take 1-2 tablets by mouth every 6 (six) hours as needed. To be taken after surgery   trolamine salicylate 10 % cream Commonly known as: ASPERCREME Apply 1 Application topically as needed for muscle pain.   TURMERIC PO Take 1 capsule by mouth daily.   Zinc 50 MG Tabs Take 50 mg by mouth daily.               Durable Medical Equipment  (From admission, onward)           Start     Ordered   08/29/22 1603  DME Walker rolling  Once       Question Answer Comment  Walker: With 5 Inch Wheels   Patient needs a walker to treat with the following condition Status post left partial knee replacement      08/29/22 1602   08/29/22 1603  DME 3 n 1  Once        08/29/22 1602   08/29/22 1603  DME Bedside commode  Once       Question:  Patient needs a bedside commode to treat with the following condition  Answer:  Status post left partial knee replacement   08/29/22 1602            Diagnostic Studies: DG Knee Right Port  Result Date: 08/29/2022 CLINICAL DATA:  Postop arthroplasty EXAM: PORTABLE RIGHT KNEE - 1-2 VIEW COMPARISON:  None Available. FINDINGS: Total knee arthroplasty. Prosthetic components are well seated. Expected soft tissue changes in the anterior knee. IMPRESSION: No complication following total arthroplasty Electronically Signed   By: Suzy Bouchard M.D.   On: 08/29/2022 14:24    Disposition: Discharge disposition: 01-Home or Self Care          Follow-up Information     Leandrew Koyanagi, MD. Schedule an  appointment as soon as possible for a visit in 2 week(s).   Specialty: Orthopedic Surgery Contact information: [address] [place] [address] [phone]  [facility], [place] Follow up.   Specialty: Home Health Services Why: The home health agency will contact you for the first home visit. Contact information: [address] [place] [address] [phone]                  Signed: Aundra Dubin 08/30/2022, 7:42 AM

## 2022-08-31 ENCOUNTER — Telehealth: Payer: Self-pay

## 2022-08-31 ENCOUNTER — Encounter (HOSPITAL_COMMUNITY): Payer: Self-pay | Admitting: Orthopaedic Surgery

## 2022-08-31 DIAGNOSIS — Z471 Aftercare following joint replacement surgery: Secondary | ICD-10-CM | POA: Diagnosis not present

## 2022-08-31 DIAGNOSIS — K219 Gastro-esophageal reflux disease without esophagitis: Secondary | ICD-10-CM | POA: Diagnosis not present

## 2022-08-31 DIAGNOSIS — Z9181 History of falling: Secondary | ICD-10-CM | POA: Diagnosis not present

## 2022-08-31 DIAGNOSIS — Z7982 Long term (current) use of aspirin: Secondary | ICD-10-CM | POA: Diagnosis not present

## 2022-08-31 DIAGNOSIS — E039 Hypothyroidism, unspecified: Secondary | ICD-10-CM | POA: Diagnosis not present

## 2022-08-31 DIAGNOSIS — Z96651 Presence of right artificial knee joint: Secondary | ICD-10-CM | POA: Diagnosis not present

## 2022-08-31 NOTE — Telephone Encounter (Signed)
Shelby Espinoza called stating that patient can only do 40 degrees with her knee the therapist turned up the [place] and she isn't tolerating it at all. He just wanted to update you

## 2022-09-02 DIAGNOSIS — Z96651 Presence of right artificial knee joint: Secondary | ICD-10-CM | POA: Diagnosis not present

## 2022-09-02 DIAGNOSIS — E039 Hypothyroidism, unspecified: Secondary | ICD-10-CM | POA: Diagnosis not present

## 2022-09-02 DIAGNOSIS — Z471 Aftercare following joint replacement surgery: Secondary | ICD-10-CM | POA: Diagnosis not present

## 2022-09-02 DIAGNOSIS — Z9181 History of falling: Secondary | ICD-10-CM | POA: Diagnosis not present

## 2022-09-02 DIAGNOSIS — Z7982 Long term (current) use of aspirin: Secondary | ICD-10-CM | POA: Diagnosis not present

## 2022-09-02 DIAGNOSIS — K219 Gastro-esophageal reflux disease without esophagitis: Secondary | ICD-10-CM | POA: Diagnosis not present

## 2022-09-04 DIAGNOSIS — E039 Hypothyroidism, unspecified: Secondary | ICD-10-CM | POA: Diagnosis not present

## 2022-09-04 DIAGNOSIS — Z9181 History of falling: Secondary | ICD-10-CM | POA: Diagnosis not present

## 2022-09-04 DIAGNOSIS — Z7982 Long term (current) use of aspirin: Secondary | ICD-10-CM | POA: Diagnosis not present

## 2022-09-04 DIAGNOSIS — K219 Gastro-esophageal reflux disease without esophagitis: Secondary | ICD-10-CM | POA: Diagnosis not present

## 2022-09-04 DIAGNOSIS — Z96651 Presence of right artificial knee joint: Secondary | ICD-10-CM | POA: Diagnosis not present

## 2022-09-04 DIAGNOSIS — Z471 Aftercare following joint replacement surgery: Secondary | ICD-10-CM | POA: Diagnosis not present

## 2022-09-06 DIAGNOSIS — Z96651 Presence of right artificial knee joint: Secondary | ICD-10-CM | POA: Diagnosis not present

## 2022-09-06 DIAGNOSIS — Z471 Aftercare following joint replacement surgery: Secondary | ICD-10-CM | POA: Diagnosis not present

## 2022-09-06 DIAGNOSIS — K219 Gastro-esophageal reflux disease without esophagitis: Secondary | ICD-10-CM | POA: Diagnosis not present

## 2022-09-06 DIAGNOSIS — E039 Hypothyroidism, unspecified: Secondary | ICD-10-CM | POA: Diagnosis not present

## 2022-09-06 DIAGNOSIS — Z9181 History of falling: Secondary | ICD-10-CM | POA: Diagnosis not present

## 2022-09-06 DIAGNOSIS — Z7982 Long term (current) use of aspirin: Secondary | ICD-10-CM | POA: Diagnosis not present

## 2022-09-07 DIAGNOSIS — Z9181 History of falling: Secondary | ICD-10-CM | POA: Diagnosis not present

## 2022-09-07 DIAGNOSIS — E039 Hypothyroidism, unspecified: Secondary | ICD-10-CM | POA: Diagnosis not present

## 2022-09-07 DIAGNOSIS — Z7982 Long term (current) use of aspirin: Secondary | ICD-10-CM | POA: Diagnosis not present

## 2022-09-07 DIAGNOSIS — Z471 Aftercare following joint replacement surgery: Secondary | ICD-10-CM | POA: Diagnosis not present

## 2022-09-07 DIAGNOSIS — K219 Gastro-esophageal reflux disease without esophagitis: Secondary | ICD-10-CM | POA: Diagnosis not present

## 2022-09-07 DIAGNOSIS — Z96651 Presence of right artificial knee joint: Secondary | ICD-10-CM | POA: Diagnosis not present

## 2022-09-10 DIAGNOSIS — Z9181 History of falling: Secondary | ICD-10-CM | POA: Diagnosis not present

## 2022-09-10 DIAGNOSIS — E039 Hypothyroidism, unspecified: Secondary | ICD-10-CM | POA: Diagnosis not present

## 2022-09-10 DIAGNOSIS — Z7982 Long term (current) use of aspirin: Secondary | ICD-10-CM | POA: Diagnosis not present

## 2022-09-10 DIAGNOSIS — Z96651 Presence of right artificial knee joint: Secondary | ICD-10-CM | POA: Diagnosis not present

## 2022-09-10 DIAGNOSIS — Z471 Aftercare following joint replacement surgery: Secondary | ICD-10-CM | POA: Diagnosis not present

## 2022-09-10 DIAGNOSIS — K219 Gastro-esophageal reflux disease without esophagitis: Secondary | ICD-10-CM | POA: Diagnosis not present

## 2022-09-12 ENCOUNTER — Telehealth: Payer: Self-pay | Admitting: Orthopaedic Surgery

## 2022-09-12 ENCOUNTER — Encounter: Payer: Self-pay | Admitting: Orthopaedic Surgery

## 2022-09-12 ENCOUNTER — Ambulatory Visit (INDEPENDENT_AMBULATORY_CARE_PROVIDER_SITE_OTHER): Payer: Federal, State, Local not specified - PPO | Admitting: Orthopaedic Surgery

## 2022-09-12 DIAGNOSIS — Z96651 Presence of right artificial knee joint: Secondary | ICD-10-CM

## 2022-09-12 MED ORDER — OXYCODONE HCL 5 MG PO TABS: 5.0000 mg | ORAL_TABLET | Freq: Every day | ORAL | 0 refills | Status: DC | PRN

## 2022-09-12 MED ORDER — IBUPROFEN 800 MG PO TABS: 800.0000 mg | ORAL_TABLET | Freq: Three times a day (TID) | ORAL | 2 refills | Status: DC | PRN

## 2022-09-12 NOTE — Telephone Encounter (Signed)
Patient did not attend PT today.

## 2022-09-12 NOTE — Progress Notes (Signed)
Post-Op Visit Note   Patient: Shelby Espinoza           Date of Birth: [birth year]           MRN: [id number] Visit Date: 09/12/2022 PCP: Shelby Peng, NP   Assessment & Plan:  Chief Complaint:  Chief Complaint  Patient presents with   Right Knee - Follow-up    Right total knee arthroplasty 08/29/2022   Visit Diagnoses:  1. Status post total right knee replacement     Plan: 2 week TKA follow up plan  Shelby Espinoza presents for 2 week follow up after right total knee replacement.  She is doing home health PT 3 times a week which is ending this week.  Takes Tylenol and oxycodone muscle laxer.  Main complaint is the swelling.  The incision is clean, dry, and intact and healing very well. There is no drainage, erythema, or signs of infection. Motion is progressing nicely.  No calf tenderness and negative Homans.  We have encouraged continued use of TED hose as well as aspirin for DVT prophylaxis, and to continue with physical therapy exercises to work on strength and endurance. Patient is progressing well. Reminders were given about signs to be aware of including redness, drainage, increased pain, fevers, calf pain, shortness of breath, or any concern should generate a phone call or a return to see [place] immediately. Will plan to follow up at 6 weeks post op for next evaluation with radiographs at that time including 2 view xrays of the operative knee.   Follow-Up Instructions: Return in about 4 weeks (around 10/10/2022).   Orders:  Orders Placed This Encounter  Procedures   Ambulatory referral to Physical Therapy   Meds ordered this encounter  Medications   ibuprofen (ADVIL) 800 MG tablet    Sig: Take 1 tablet (800 mg total) by mouth every 8 (eight) hours as needed.    Dispense:  30 tablet    Refill:  2   oxyCODONE (OXY IR/ROXICODONE) 5 MG immediate release tablet    Sig: Take 1-2 tablets (5-10 mg total) by mouth daily as needed for severe pain.    Dispense:  20 tablet    Refill:  0     Imaging: No results found.  PMFS History: Patient Active Problem List   Diagnosis Date Noted   Status post total right knee replacement 08/29/2022   Primary osteoarthritis of right knee 07/25/2022   Viral URI 05/17/2018   Hypothyroidism 05/02/2007   Past Medical History:  Diagnosis Date   Arthritis    GERD (gastroesophageal reflux disease)    Hemorrhoids    Hypothyroidism     Family History  Problem Relation Age of Onset   Alcohol abuse Father    Throat cancer Father    Heart disease Father    Hypertension Father    Breast cancer Mother    Breast cancer Maternal Aunt    Heart disease Paternal Aunt    Colon cancer Neg Hx     Past Surgical History:  Procedure Laterality Date   APPENDECTOMY  1985   BREAST REDUCTION SURGERY Bilateral [place]   COLONOSCOPY  2022   TOTAL KNEE ARTHROPLASTY Right 08/29/2022   Procedure: RIGHT TOTAL KNEE ARTHROPLASTY;  Surgeon: Leandrew Koyanagi, MD;  Location: [facility];  Service: Orthopedics;  Laterality: Right;   TUBAL LIGATION  1998   Social History   Occupational History   Not on file  Tobacco  Use   Smoking status: Never   Smokeless tobacco: Never  Vaping Use   Vaping Use: Never used  Substance and Sexual Activity   Alcohol use: No   Drug use: No   Sexual activity: Yes    Birth control/protection: None

## 2022-09-17 ENCOUNTER — Ambulatory Visit: Payer: Federal, State, Local not specified - PPO | Admitting: Physical Therapy

## 2022-09-17 ENCOUNTER — Other Ambulatory Visit: Payer: Self-pay

## 2022-09-17 ENCOUNTER — Encounter: Payer: Self-pay | Admitting: Physical Therapy

## 2022-09-17 DIAGNOSIS — M6281 Muscle weakness (generalized): Secondary | ICD-10-CM

## 2022-09-17 DIAGNOSIS — R600 Localized edema: Secondary | ICD-10-CM | POA: Diagnosis not present

## 2022-09-17 DIAGNOSIS — M25661 Stiffness of right knee, not elsewhere classified: Secondary | ICD-10-CM

## 2022-09-17 DIAGNOSIS — R2681 Unsteadiness on feet: Secondary | ICD-10-CM

## 2022-09-17 DIAGNOSIS — R2689 Other abnormalities of gait and mobility: Secondary | ICD-10-CM

## 2022-09-17 DIAGNOSIS — M25561 Pain in right knee: Secondary | ICD-10-CM | POA: Diagnosis not present

## 2022-09-17 NOTE — Therapy (Signed)
OUTPATIENT PHYSICAL THERAPY LOWER EXTREMITY EVALUATION   Patient Name: Shelby Espinoza MRN: [id number] DOB:07-13-1966, 56 y.o., female Today's Date: 09/17/2022  END OF SESSION:  PT End of Session - 09/17/22 1232     Visit Number 1    Number of Visits 22    Date for PT Re-Evaluation 11/23/22    Authorization Type [employer] Employee    Authorization Time Period $25 COPAY, DED MET, OOP Individual FEP BLUE FOCUS Remaining $[phone]    Progress Note Due on Visit 10    PT Start Time 1015    PT Stop Time 1100    PT Time Calculation (min) 45 min    Activity Tolerance Patient tolerated treatment well;Patient limited by pain;Patient limited by fatigue    Behavior During Therapy [facility] for tasks assessed/performed             Past Medical History:  Diagnosis Date   Arthritis    GERD (gastroesophageal reflux disease)    Hemorrhoids    Hypothyroidism    Past Surgical History:  Procedure Laterality Date   APPENDECTOMY  1985   BREAST REDUCTION SURGERY Bilateral [place]   COLONOSCOPY  2022   TOTAL KNEE ARTHROPLASTY Right 08/29/2022   Procedure: RIGHT TOTAL KNEE ARTHROPLASTY;  Surgeon: Leandrew Koyanagi, MD;  Location: [facility];  Service: Orthopedics;  Laterality: Right;   TUBAL LIGATION  1998   Patient Active Problem List   Diagnosis Date Noted   Status post total right knee replacement 08/29/2022   Primary osteoarthritis of right knee 07/25/2022   Viral URI 05/17/2018   Hypothyroidism 05/02/2007    PCP: Dorothyann Peng, MD  REFERRING PROVIDER: Leandrew Koyanagi, MD  REFERRING DIAG: 7032584632 (ICD-10-CM) - Status post total right knee replacement   THERAPY DIAG:  Acute pain of right knee  Stiffness of right knee, not elsewhere classified  Muscle weakness (generalized)  Localized edema  Other abnormalities of gait and mobility  Unsteadiness on feet  Rationale for Evaluation and Treatment: Rehabilitation  ONSET DATE: 08/29/2022 Right  TKA  SUBJECTIVE:   SUBJECTIVE STATEMENT: She underwent a right TKA on 08/29/2022 due to OA.  She had HHPT thru 09/13/2022. She has CPM machine but has not used it in last 2 days due to pain & swelling.   PERTINENT HISTORY: OA, breast reduction sg  PAIN:  Are you having pain? Yes: NPRS scale: 4/10 this morning and in last week 0/10 with Oxy highest 8/10 Pain location: right knee anterior Pain description: throbbing Aggravating factors: doing more activities Relieving factors: Oxy, ibuprofen Rx strength, muscle relaxor, ice machine 20 min 4x/day  PRECAUTIONS: None  WEIGHT BEARING RESTRICTIONS: No  FALLS:  Has patient fallen in last 6 months? No  LIVING ENVIRONMENT: Lives with: lives with their spouse and her mother who is good health Lives in: [place] with master bedroom & bathroom. Her office is upstairs Stairs: Yes: Internal: 14 steps; on left going up and External: 4 steps; on left going up Has following equipment at home: Single point cane and Walker - 2 wheeled  OCCUPATION: work from home for [employer], travels when [employer] walking around area  PLOF: Independent, Independent with household mobility without device, and Independent with community mobility without device  PATIENT GOALS:   to be mobile again, return to work, [employer]  NEXT MD VISIT: 10/10/2022  OBJECTIVE:   DIAGNOSTIC FINDINGS: 08/29/2022 X-ray Total knee arthroplasty. Prosthetic components are well seated. Expected soft  tissue changes in the anterior kne  PATIENT SURVEYS:  FOTO 55% and predicted 74%  COGNITION: Overall cognitive status: WFL    SENSATION: Intact to touch  EDEMA:  RLE: above knee 58.3 cm  around knee 51cm  below knee 45.1cm LLE:: above knee 53.8cm  around knee 45.1cm  below knee  39cm  POSTURE: flexed trunk  and weight shift left  PALPATION: Tenderness along incision, patella and joint line.  Denies tenderness over quad, hamstring and gastroc.   LOWER EXTREMITY  ROM:   ROM Right eval Left eval  Hip flexion    Hip extension    Hip abduction    Hip adduction    Hip internal rotation    Hip external rotation    Knee flexion Supine P: 68* Seated  A: 54* P: 65*   Knee extension Seated A: LAQ -55* Quad set -9* P: -2*   Ankle dorsiflexion    Ankle plantarflexion    Ankle inversion    Ankle eversion     (Blank rows = not tested)  LOWER EXTREMITY MMT:  MMT Right eval Left eval  Hip flexion    Hip extension    Hip abduction    Hip adduction    Hip internal rotation    Hip external rotation    Knee flexion 2/5   Knee extension 2-/5 Minimal quad activitation   Ankle dorsiflexion    Ankle plantarflexion    Ankle inversion    Ankle eversion     (Blank rows = not tested)    FUNCTIONAL TESTS:  18 inch chair transfer: requires use of BUEs to push up. Lt SLS: unable to perform as lifting RLE was difficult Rt SLS: unable Supine <> sit: has to use LLE to manage RLE.   GAIT: Distance walked: 14' Assistive device utilized: Environmental consultant - 2 wheeled Level of assistance: SBA Comments: antalgic pattern with decreased RLE stance, knee flexed in stance, no knee flexion for swing, limited WB RLE due to pain & weakness.    TODAY'S TREATMENT                                                                          DATE:  09/17/2022 Therex:    HEP instruction/performance c cues for techniques, handout provided.  Trial set performed of each for comprehension and symptom assessment.  See below for exercise list. Self-Care:  PT recommended elevation with RLE higher than heart >/= 2x/day >/=15 min.  CPM >/= 2x/day she may back off on 90* setting.  Pt verbalized understanding.   PATIENT EDUCATION:  Education details: HEP, POC Person educated: Patient Education method: Consulting civil engineer, Demonstration, Verbal cues, and Handouts Education comprehension: verbalized understanding, returned demonstration, and verbal cues required  HOME EXERCISE  PROGRAM: Access Code: [email or web address] URL: [email or web address] Date: 09/17/2022 Prepared by: Jamey Reas  Exercises - Ankle Alphabet in Elevation  - 2-4 x daily - 7 x weekly - 1 sets - 1 reps - Quad Setting and Stretching  - 2-4 x daily - 7 x weekly - 5-10 sets - 10 reps - prop 5-10 minutes & quad set5 seconds hold - Supine Heel Slide with Strap  - 2-3 x daily - 7 x weekly - 2-3 sets - 10  reps - 5 seconds hold - Supine Knee Extension Strengthening  - 2-3 x daily - 7 x weekly - 2-3 sets - 10 reps - 5 seconds hold - Supine Straight Leg Raises  - 2-3 x daily - 7 x weekly - 2-3 sets - 10 reps - 5 seconds hold - Seated Knee Flexion Extension AROM   - 2-4 x daily - 7 x weekly - 2-3 sets - 10 reps - 5 seconds hold - Seated Hamstring Stretch with Strap  - 2-4 x daily - 7 x weekly - 1 sets - 3 reps - 20-30 seconds hold  ASSESSMENT:  CLINICAL IMPRESSION: Patient is a 57 y.o. who comes to clinic with complaints of right knee pain with mobility, strength and movement coordination deficits that impair their ability to perform usual daily and recreational functional activities without increase difficulty/symptoms at this time.  Patient to benefit from skilled PT services to address impairments and limitations to improve to previous level of function without restriction secondary to condition.   OBJECTIVE IMPAIRMENTS: Abnormal gait, decreased activity tolerance, decreased balance, decreased endurance, decreased knowledge of use of DME, decreased mobility, difficulty walking, decreased ROM, decreased strength, increased edema, increased muscle spasms, impaired flexibility, postural dysfunction, and pain.   ACTIVITY LIMITATIONS: carrying, lifting, bending, sitting, standing, squatting, sleeping, stairs, transfers, bed mobility, and locomotion level  PARTICIPATION LIMITATIONS: meal prep, cleaning, laundry, driving, community activity, occupation, and church  PERSONAL FACTORS: Profession and 1  comorbidity: see PMH  are also affecting patient's functional outcome.   REHAB POTENTIAL: Good  CLINICAL DECISION MAKING: Stable/uncomplicated  EVALUATION COMPLEXITY: Low   GOALS: Goals reviewed with patient? Yes  SHORT TERM GOALS: (target date for Short term goals 10/19/2022)   1.  Patient will demonstrate independent use of home exercise program to maintain progress from in clinic treatments.  Goal status: New  LONG TERM GOALS: (target dates for all long term goals are 10 weeks  11/23/2022 )   1. Patient will demonstrate/report pain at worst less than or equal to 2/10 to facilitate minimal limitation in daily activity secondary to pain symptoms.  Goal status: New   2. Patient will demonstrate independent use of home exercise program to facilitate ability to maintain/progress functional gains from skilled physical therapy services.  Goal status: New   3. Patient will demonstrate FOTO outcome > or = 74 % to indicate reduced disability due to condition.  Goal status: New   4.  Patient will demonstrate right LE MMT 5/5 throughout to faciltiate usual transfers, stairs, squatting at [facility] for daily life.   Goal status: New   5.  Patient PROM right knee 0* - 110* Goal status: New   6.  Patient ambulates >500' negotiates ramps, curbs & stairs single rail without device independently. Goal status: New   7.  patient verbalizes understanding of ongoing HEP.  Goal Status: New   PLAN:  PT FREQUENCY: 2-3x/wk   PT DURATION: 10 weeks  PLANNED INTERVENTIONS: Therapeutic exercises, Therapeutic activity, Neuromuscular re-education, Balance training, Gait training, Patient/Family education, Self Care, Joint mobilization, Stair training, Vestibular training, DME instructions, Dry Needling, Electrical stimulation, Cryotherapy, Moist heat, scar mobilization, Taping, Vasopneumatic device, Ultrasound, Manual therapy, and Re-evaluation  PLAN FOR NEXT SESSION: check & update HEP, manual  therapy & exercise for range, end with vaso.   Jamey Reas, PT, DPT 09/17/2022, 4:10 PM

## 2022-09-18 ENCOUNTER — Encounter: Payer: Self-pay | Admitting: Physical Therapy

## 2022-09-18 ENCOUNTER — Ambulatory Visit: Payer: Federal, State, Local not specified - PPO | Admitting: Physical Therapy

## 2022-09-18 DIAGNOSIS — R2689 Other abnormalities of gait and mobility: Secondary | ICD-10-CM

## 2022-09-18 DIAGNOSIS — M25561 Pain in right knee: Secondary | ICD-10-CM

## 2022-09-18 DIAGNOSIS — R2681 Unsteadiness on feet: Secondary | ICD-10-CM

## 2022-09-18 DIAGNOSIS — M6281 Muscle weakness (generalized): Secondary | ICD-10-CM | POA: Diagnosis not present

## 2022-09-18 DIAGNOSIS — M25661 Stiffness of right knee, not elsewhere classified: Secondary | ICD-10-CM | POA: Diagnosis not present

## 2022-09-18 DIAGNOSIS — R600 Localized edema: Secondary | ICD-10-CM | POA: Diagnosis not present

## 2022-09-18 NOTE — Therapy (Signed)
OUTPATIENT PHYSICAL THERAPY TREATMENT NOTE   Patient Name: Shelby Espinoza MRN: [id number] DOB:July 02, 1966, 56 y.o., female Today's Date: 09/18/2022  PCP:  Dorothyann Peng, NP  REFERRING PROVIDER:  Leandrew Koyanagi, MD   END OF SESSION:   PT End of Session - 09/18/22 0842     Visit Number 2    Number of Visits 22    Date for PT Re-Evaluation 11/23/22    Authorization Type [employer] Employee    Authorization Time Period $25 COPAY, DED MET, OOP Individual FEP BLUE FOCUS Remaining $[phone]    Progress Note Due on Visit 10    PT Start Time 0805    PT Stop Time 0850    PT Time Calculation (min) 45 min    Equipment Utilized During Treatment Gait belt    Activity Tolerance Patient tolerated treatment well;Patient limited by pain;Patient limited by fatigue    Behavior During Therapy [facility] for tasks assessed/performed             Past Medical History:  Diagnosis Date   Arthritis    GERD (gastroesophageal reflux disease)    Hemorrhoids    Hypothyroidism    Past Surgical History:  Procedure Laterality Date   APPENDECTOMY  1985   BREAST REDUCTION SURGERY Bilateral [place]   COLONOSCOPY  2022   TOTAL KNEE ARTHROPLASTY Right 08/29/2022   Procedure: RIGHT TOTAL KNEE ARTHROPLASTY;  Surgeon: Leandrew Koyanagi, MD;  Location: [place];  Service: Orthopedics;  Laterality: Right;   TUBAL LIGATION  1998   Patient Active Problem List   Diagnosis Date Noted   Status post total right knee replacement 08/29/2022   Primary osteoarthritis of right knee 07/25/2022   Viral URI 05/17/2018   Hypothyroidism 05/02/2007    REFERRING DIAG:  Z96.651 (ICD-10-CM) - Status post total right knee replacement    THERAPY DIAG:  Acute pain of right knee  Stiffness of right knee, not elsewhere classified  Muscle weakness (generalized)  Localized edema  Other abnormalities of gait and mobility  Unsteadiness on feet  Rationale for Evaluation and Treatment  Rehabilitation  PERTINENT HISTORY: OA, breast reduction sg   PRECAUTIONS: none  SUBJECTIVE:                                                                                                                                                                                      SUBJECTIVE STATEMENT:   Pt arriving to therapy reporting 3/10 pain in her Rt knee after taking pain meds and muscle relaxer.    PAIN:  Are you having pain? Yes Scale: 3/10 Location: Rt anterior knee  OBJECTIVE: (objective measures completed at initial evaluation unless otherwise dated)  DIAGNOSTIC FINDINGS: 08/29/2022 X-ray Total knee arthroplasty. Prosthetic components are well seated. Expected soft tissue changes in the anterior kne   PATIENT SURVEYS:  FOTO 55% and predicted 74%   COGNITION: Overall cognitive status: WFL              SENSATION: Intact to touch   EDEMA:  RLE: above knee 58.3 cm  around knee 51cm  below knee 45.1cm LLE:: above knee 53.8cm  around knee 45.1cm  below knee  39cm   POSTURE: flexed trunk  and weight shift left   PALPATION: Tenderness along incision, patella and joint line.  Denies tenderness over quad, hamstring and gastroc.    LOWER EXTREMITY ROM:    ROM Right eval Rt 09/18/22  Hip flexion      Hip extension      Hip abduction      Hip adduction      Hip internal rotation      Hip external rotation      Knee flexion Supine P: 68* Seated  A: 54* P: 65* Supine A: 58 P: 65   Knee extension Seated A: LAQ -55* Quad set -9* P: -2* Supine A: -5 P: -2   Ankle dorsiflexion      Ankle plantarflexion      Ankle inversion      Ankle eversion       (Blank rows = not tested)   LOWER EXTREMITY MMT:   MMT Right eval Left eval  Hip flexion      Hip extension      Hip abduction      Hip adduction      Hip internal rotation      Hip external rotation      Knee flexion 2/5    Knee extension 2-/5 Minimal quad activitation    Ankle dorsiflexion       Ankle plantarflexion      Ankle inversion      Ankle eversion       (Blank rows = not tested)     FUNCTIONAL TESTS:  18 inch chair transfer: requires use of BUEs to push up. Lt SLS: unable to perform as lifting RLE was difficult Rt SLS: unable Supine <> sit: has to use LLE to manage RLE.    GAIT: Distance walked: 58' Assistive device utilized: Environmental consultant - 2 wheeled Level of assistance: SBA Comments: antalgic pattern with decreased RLE stance, knee flexed in stance, no knee flexion for swing, limited WB RLE due to pain & weakness.      TODAY'S TREATMENT                                                                          DATE:   09/18/22:  TherEx:  Nustep x 8 minutes level 4 c UE Calf stretch on slant board: x 3 holding 30 seconds Step ups on 6 inch step x 10 leading with Rt c UE support LAQ: x 15 2#  Hamstring curls Level 3 band x 15 holding 3 sec Hamstring stretch: x 2 holding 30 sec Supine SAQ: x 15 holding 3 sec Manual:  PROM Rt knee flexion/extension Modalities: Vasopneumatic 34 degrees  x 10 minutes low compression   09/17/2022 Therex:    HEP instruction/performance c cues for techniques, handout provided.  Trial set performed of each for comprehension and symptom assessment.  See below for exercise list. Self-Care:  PT recommended elevation with RLE higher than heart >/= 2x/day >/=15 min.  CPM >/= 2x/day she may back off on 90* setting.  Pt verbalized understanding.    PATIENT EDUCATION:  Education details: HEP, POC Person educated: Patient Education method: Consulting civil engineer, Demonstration, Verbal cues, and Handouts Education comprehension: verbalized understanding, returned demonstration, and verbal cues required   HOME EXERCISE PROGRAM: Access Code: [email or web address] URL: [email or web address] Date: 09/17/2022 Prepared by: Jamey Reas   Exercises - Ankle Alphabet in Elevation  - 2-4 x daily - 7 x weekly - 1 sets - 1 reps - Quad Setting and Stretching   - 2-4 x daily - 7 x weekly - 5-10 sets - 10 reps - prop 5-10 minutes & quad set5 seconds hold - Supine Heel Slide with Strap  - 2-3 x daily - 7 x weekly - 2-3 sets - 10 reps - 5 seconds hold - Supine Knee Extension Strengthening  - 2-3 x daily - 7 x weekly - 2-3 sets - 10 reps - 5 seconds hold - Supine Straight Leg Raises  - 2-3 x daily - 7 x weekly - 2-3 sets - 10 reps - 5 seconds hold - Seated Knee Flexion Extension AROM   - 2-4 x daily - 7 x weekly - 2-3 sets - 10 reps - 5 seconds hold - Seated Hamstring Stretch with Strap  - 2-4 x daily - 7 x weekly - 1 sets - 3 reps - 20-30 seconds hold   ASSESSMENT:   CLINICAL IMPRESSION: Pt arriving to therapy reporting 3/10 pain in her Rt knee on pain meds and muscle relaxer. Pt tolerating exercises well. Pt currently amb with a rolling walker. Pt encouraged to continue ice and elevation and doing her HEP. Continue skilled PT per POC.    OBJECTIVE IMPAIRMENTS: Abnormal gait, decreased activity tolerance, decreased balance, decreased endurance, decreased knowledge of use of DME, decreased mobility, difficulty walking, decreased ROM, decreased strength, increased edema, increased muscle spasms, impaired flexibility, postural dysfunction, and pain.    ACTIVITY LIMITATIONS: carrying, lifting, bending, sitting, standing, squatting, sleeping, stairs, transfers, bed mobility, and locomotion level   PARTICIPATION LIMITATIONS: meal prep, cleaning, laundry, driving, community activity, occupation, and church   PERSONAL FACTORS: Profession and 1 comorbidity: see PMH  are also affecting patient's functional outcome.    REHAB POTENTIAL: Good   CLINICAL DECISION MAKING: Stable/uncomplicated   EVALUATION COMPLEXITY: Low     GOALS: Goals reviewed with patient? Yes   SHORT TERM GOALS: (target date for Short term goals 10/19/2022)    1.  Patient will demonstrate independent use of home exercise program to maintain progress from in clinic treatments.   Goal  status: On-going 09/18/22   LONG TERM GOALS: (target dates for all long term goals are 10 weeks  11/23/2022 )   1. Patient will demonstrate/report pain at worst less than or equal to 2/10 to facilitate minimal limitation in daily activity secondary to pain symptoms.   Goal status: New   2. Patient will demonstrate independent use of home exercise program to facilitate ability to maintain/progress functional gains from skilled physical therapy services.   Goal status: New   3. Patient will demonstrate FOTO outcome > or = 74 % to indicate reduced disability due to condition.   Goal status: New  4.  Patient will demonstrate right LE MMT 5/5 throughout to faciltiate usual transfers, stairs, squatting at [facility] for daily life.    Goal status: New   5.  Patient PROM right knee 0* - 110* Goal status: New   6.  Patient ambulates >500' negotiates ramps, curbs & stairs single rail without device independently. Goal status: New   7.  patient verbalizes understanding of ongoing HEP.  Goal Status: New     PLAN:   PT FREQUENCY: 2-3x/wk    PT DURATION: 10 weeks   PLANNED INTERVENTIONS: Therapeutic exercises, Therapeutic activity, Neuromuscular re-education, Balance training, Gait training, Patient/Family education, Self Care, Joint mobilization, Stair training, Vestibular training, DME instructions, Dry Needling, Electrical stimulation, Cryotherapy, Moist heat, scar mobilization, Taping, Vasopneumatic device, Ultrasound, Manual therapy, and Re-evaluation   PLAN FOR NEXT SESSION: check & update HEP, manual therapy & exercise for range, end with vaso.        Oretha Caprice, PT, MPT 09/18/2022, 8:44 AM

## 2022-09-21 ENCOUNTER — Encounter: Payer: Self-pay | Admitting: Physical Therapy

## 2022-09-21 ENCOUNTER — Ambulatory Visit (INDEPENDENT_AMBULATORY_CARE_PROVIDER_SITE_OTHER): Payer: Federal, State, Local not specified - PPO | Admitting: Physical Therapy

## 2022-09-21 DIAGNOSIS — R2681 Unsteadiness on feet: Secondary | ICD-10-CM

## 2022-09-21 DIAGNOSIS — R600 Localized edema: Secondary | ICD-10-CM

## 2022-09-21 DIAGNOSIS — M6281 Muscle weakness (generalized): Secondary | ICD-10-CM | POA: Diagnosis not present

## 2022-09-21 DIAGNOSIS — M25561 Pain in right knee: Secondary | ICD-10-CM

## 2022-09-21 DIAGNOSIS — M25661 Stiffness of right knee, not elsewhere classified: Secondary | ICD-10-CM | POA: Diagnosis not present

## 2022-09-21 DIAGNOSIS — R2689 Other abnormalities of gait and mobility: Secondary | ICD-10-CM

## 2022-09-21 NOTE — Therapy (Signed)
OUTPATIENT PHYSICAL THERAPY TREATMENT NOTE   Patient Name: Shelby Espinoza MRN: [id number] DOB:[birth year], 56 y.o., female Today's Date: 09/21/2022  PCP:  Dorothyann Peng, NP   REFERRING PROVIDER:  Leandrew Koyanagi, MD   END OF SESSION:   PT End of Session - 09/21/22 0809     Visit Number 3    Number of Visits 22    Date for PT Re-Evaluation 11/23/22    Authorization Type [employer] Employee    Authorization Time Period $25 COPAY, DED MET, OOP Individual FEP BLUE FOCUS Remaining $[phone]    Progress Note Due on Visit 10    PT Start Time 0805    PT Stop Time 0850    PT Time Calculation (min) 45 min    Equipment Utilized During Treatment --    Activity Tolerance Patient tolerated treatment well;Patient limited by pain;Patient limited by fatigue    Behavior During Therapy [facility] for tasks assessed/performed              Past Medical History:  Diagnosis Date   Arthritis    GERD (gastroesophageal reflux disease)    Hemorrhoids    Hypothyroidism    Past Surgical History:  Procedure Laterality Date   APPENDECTOMY  1985   BREAST REDUCTION SURGERY Bilateral [place]   COLONOSCOPY  2022   TOTAL KNEE ARTHROPLASTY Right 08/29/2022   Procedure: RIGHT TOTAL KNEE ARTHROPLASTY;  Surgeon: Leandrew Koyanagi, MD;  Location: [place];  Service: Orthopedics;  Laterality: Right;   TUBAL LIGATION  1998   Patient Active Problem List   Diagnosis Date Noted   Status post total right knee replacement 08/29/2022   Primary osteoarthritis of right knee 07/25/2022   Viral URI 05/17/2018   Hypothyroidism 05/02/2007    REFERRING DIAG:  Z96.651 (ICD-10-CM) - Status post total right knee replacement    THERAPY DIAG:  Acute pain of right knee  Stiffness of right knee, not elsewhere classified  Muscle weakness (generalized)  Localized edema  Other abnormalities of gait and mobility  Unsteadiness on feet  Rationale for Evaluation and Treatment  Rehabilitation  PERTINENT HISTORY: OA, breast reduction sg   PRECAUTIONS: none  SUBJECTIVE:                                                                                                                                                                                      SUBJECTIVE STATEMENT:   Knee is doing pretty well this morning, but just a little stiff and "needs to wake up."   PAIN:  Are you having pain? Yes Scale: 3/10 Location: Rt anterior knee  OBJECTIVE: (objective measures completed at initial evaluation unless otherwise dated)  DIAGNOSTIC FINDINGS: 08/29/2022 X-ray Total knee arthroplasty. Prosthetic components are well seated. Expected soft tissue changes in the anterior kne   PATIENT SURVEYS:  FOTO 55% and predicted 74%   EDEMA:  RLE: above knee 58.3 cm  around knee 51cm  below knee 45.1cm LLE:: above knee 53.8cm  around knee 45.1cm  below knee  39cm   POSTURE: flexed trunk  and weight shift left   PALPATION: Tenderness along incision, patella and joint line.  Denies tenderness over quad, hamstring and gastroc.    LOWER EXTREMITY ROM:    ROM Right eval Rt 09/18/22 Right 09/21/22  Knee flexion Supine P: 68* Seated  A: 54* P: 65* Supine A: 58 P: 65  Supine AA: 76  Knee extension Seated A: LAQ -55* Quad set -9* P: -2* Supine A: -5 P: -2  A: -15 (seated LAQ)   (Blank rows = not tested)   LOWER EXTREMITY MMT:   MMT Right eval Left eval  Hip flexion      Hip extension      Hip abduction      Hip adduction      Hip internal rotation      Hip external rotation      Knee flexion 2/5    Knee extension 2-/5 Minimal quad activitation    Ankle dorsiflexion      Ankle plantarflexion      Ankle inversion      Ankle eversion       (Blank rows = not tested)     FUNCTIONAL TESTS:  18 inch chair transfer: requires use of BUEs to push up. Lt SLS: unable to perform as lifting RLE was difficult Rt SLS: unable Supine <> sit: has to use LLE to  manage RLE.    GAIT: Distance walked: 18' Assistive device utilized: Environmental consultant - 2 wheeled Level of assistance: SBA Comments: antalgic pattern with decreased RLE stance, knee flexed in stance, no knee flexion for swing, limited WB RLE due to pain & weakness.      TODAY'S TREATMENT                                                                          DATE:   09/21/22 TherEx: Nustep x 8 minutes level 5 c UE Seated Rt LAQ 3x10; 5 sec hold Supine AA Rt heel slide x 5 reps; then did 1 min holds for LLLD stretch with incremental increase in flexion as tolerated each minute x 3 min Long sitting quad sets x 5 reps - utilizing visual cues and performing Lt at the same time for improved quad activation on Rt.  Modalities: Vasopneumatic 34 degrees x 10 minutes low compression  09/18/22:  TherEx:  Nustep x 8 minutes level 4 c UE Calf stretch on slant board: x 3 holding 30 seconds Step ups on 6 inch step x 10 leading with Rt c UE support LAQ: x 15 2#  Hamstring curls Level 3 band x 15 holding 3 sec Hamstring stretch: x 2 holding 30 sec Supine SAQ: x 15 holding 3 sec Manual:  PROM Rt knee flexion/extension Modalities: Vasopneumatic 34 degrees x 10 minutes low compression  09/17/2022 Therex:    HEP instruction/performance c cues for techniques, handout provided.  Trial set performed of each for comprehension and symptom assessment.  See below for exercise list. Self-Care:  PT recommended elevation with RLE higher than heart >/= 2x/day >/=15 min.  CPM >/= 2x/day she may back off on 90* setting.  Pt verbalized understanding.    PATIENT EDUCATION:  Education details: HEP, POC Person educated: Patient Education method: Consulting civil engineer, Demonstration, Verbal cues, and Handouts Education comprehension: verbalized understanding, returned demonstration, and verbal cues required   HOME EXERCISE PROGRAM: Access Code: [email or web address] URL: [email or web address] Date: 09/17/2022 Prepared  by: Jamey Reas   Exercises - Ankle Alphabet in Elevation  - 2-4 x daily - 7 x weekly - 1 sets - 1 reps - Quad Setting and Stretching  - 2-4 x daily - 7 x weekly - 5-10 sets - 10 reps - prop 5-10 minutes & quad set5 seconds hold - Supine Heel Slide with Strap  - 2-3 x daily - 7 x weekly - 2-3 sets - 10 reps - 5 seconds hold - Supine Knee Extension Strengthening  - 2-3 x daily - 7 x weekly - 2-3 sets - 10 reps - 5 seconds hold - Supine Straight Leg Raises  - 2-3 x daily - 7 x weekly - 2-3 sets - 10 reps - 5 seconds hold - Seated Knee Flexion Extension AROM   - 2-4 x daily - 7 x weekly - 2-3 sets - 10 reps - 5 seconds hold - Seated Hamstring Stretch with Strap  - 2-4 x daily - 7 x weekly - 1 sets - 3 reps - 20-30 seconds hold   ASSESSMENT:   CLINICAL IMPRESSION: Improvement in ROM noted today with steady progress.  Still limited by edema and quad activation at this time.  Will continue to benefit from PT to maximize function.   OBJECTIVE IMPAIRMENTS: Abnormal gait, decreased activity tolerance, decreased balance, decreased endurance, decreased knowledge of use of DME, decreased mobility, difficulty walking, decreased ROM, decreased strength, increased edema, increased muscle spasms, impaired flexibility, postural dysfunction, and pain.    ACTIVITY LIMITATIONS: carrying, lifting, bending, sitting, standing, squatting, sleeping, stairs, transfers, bed mobility, and locomotion level   PARTICIPATION LIMITATIONS: meal prep, cleaning, laundry, driving, community activity, occupation, and church   PERSONAL FACTORS: Profession and 1 comorbidity: see PMH  are also affecting patient's functional outcome.    REHAB POTENTIAL: Good   CLINICAL DECISION MAKING: Stable/uncomplicated   EVALUATION COMPLEXITY: Low     GOALS: Goals reviewed with patient? Yes   SHORT TERM GOALS: (target date for Short term goals 10/19/2022)    1.  Patient will demonstrate independent use of home exercise program to  maintain progress from in clinic treatments.   Goal status: On-going 09/18/22   LONG TERM GOALS: (target dates for all long term goals are 10 weeks  11/23/2022 )   1. Patient will demonstrate/report pain at worst less than or equal to 2/10 to facilitate minimal limitation in daily activity secondary to pain symptoms.   Goal status: New   2. Patient will demonstrate independent use of home exercise program to facilitate ability to maintain/progress functional gains from skilled physical therapy services.   Goal status: New   3. Patient will demonstrate FOTO outcome > or = 74 % to indicate reduced disability due to condition.   Goal status: New   4.  Patient will demonstrate right LE MMT 5/5 throughout to faciltiate usual transfers, stairs, squatting at [facility] for daily life.  Goal status: New   5.  Patient PROM right knee 0* - 110* Goal status: New   6.  Patient ambulates >500' negotiates ramps, curbs & stairs single rail without device independently. Goal status: New   7.  patient verbalizes understanding of ongoing HEP.  Goal Status: New     PLAN:   PT FREQUENCY: 2-3x/wk    PT DURATION: 10 weeks   PLANNED INTERVENTIONS: Therapeutic exercises, Therapeutic activity, Neuromuscular re-education, Balance training, Gait training, Patient/Family education, Self Care, Joint mobilization, Stair training, Vestibular training, DME instructions, Dry Needling, Electrical stimulation, Cryotherapy, Moist heat, scar mobilization, Taping, Vasopneumatic device, Ultrasound, Manual therapy, and Re-evaluation   PLAN FOR NEXT SESSION:  check & update HEP, manual therapy & exercise for range, needs quad focus for extension, end with vaso.        Laureen Abrahams, PT, DPT 09/21/22 8:43 AM

## 2022-09-26 ENCOUNTER — Ambulatory Visit: Payer: Federal, State, Local not specified - PPO | Admitting: Physical Therapy

## 2022-09-26 ENCOUNTER — Encounter: Payer: Self-pay | Admitting: Physical Therapy

## 2022-09-26 DIAGNOSIS — M6281 Muscle weakness (generalized): Secondary | ICD-10-CM | POA: Diagnosis not present

## 2022-09-26 DIAGNOSIS — R2681 Unsteadiness on feet: Secondary | ICD-10-CM

## 2022-09-26 DIAGNOSIS — M25561 Pain in right knee: Secondary | ICD-10-CM | POA: Diagnosis not present

## 2022-09-26 DIAGNOSIS — R600 Localized edema: Secondary | ICD-10-CM

## 2022-09-26 DIAGNOSIS — R2689 Other abnormalities of gait and mobility: Secondary | ICD-10-CM

## 2022-09-26 DIAGNOSIS — M25661 Stiffness of right knee, not elsewhere classified: Secondary | ICD-10-CM | POA: Diagnosis not present

## 2022-09-26 NOTE — Therapy (Signed)
OUTPATIENT PHYSICAL THERAPY TREATMENT NOTE   Patient Name: Shelby Espinoza MRN: [id number] DOB:[birth year], 56 y.o., female Today's Date: 09/26/2022  PCP:  Dorothyann Peng, NP   REFERRING PROVIDER:  Leandrew Koyanagi, MD   END OF SESSION:   PT End of Session - 09/26/22 1016     Visit Number 4    Number of Visits 22    Date for PT Re-Evaluation 11/23/22    Authorization Type [employer] Employee    Authorization Time Period $25 COPAY, DED MET, OOP Individual FEP BLUE FOCUS Remaining $[phone]    Progress Note Due on Visit 10    PT Start Time 1017    PT Stop Time 1111    PT Time Calculation (min) 54 min    Activity Tolerance Patient tolerated treatment well;Patient limited by pain;Patient limited by fatigue    Behavior During Therapy [facility] for tasks assessed/performed              Past Medical History:  Diagnosis Date   Arthritis    GERD (gastroesophageal reflux disease)    Hemorrhoids    Hypothyroidism    Past Surgical History:  Procedure Laterality Date   APPENDECTOMY  1985   BREAST REDUCTION SURGERY Bilateral [place]   COLONOSCOPY  2022   TOTAL KNEE ARTHROPLASTY Right 08/29/2022   Procedure: RIGHT TOTAL KNEE ARTHROPLASTY;  Surgeon: Leandrew Koyanagi, MD;  Location: [place];  Service: Orthopedics;  Laterality: Right;   TUBAL LIGATION  1998   Patient Active Problem List   Diagnosis Date Noted   Status post total right knee replacement 08/29/2022   Primary osteoarthritis of right knee 07/25/2022   Viral URI 05/17/2018   Hypothyroidism 05/02/2007    REFERRING DIAG:  Z96.651 (ICD-10-CM) - Status post total right knee replacement    THERAPY DIAG:  Acute pain of right knee  Stiffness of right knee, not elsewhere classified  Muscle weakness (generalized)  Localized edema  Other abnormalities of gait and mobility  Unsteadiness on feet  Rationale for Evaluation and Treatment Rehabilitation  PERTINENT HISTORY: OA, breast reduction sg    PRECAUTIONS: none  SUBJECTIVE:                                                                                                                                                                                      SUBJECTIVE STATEMENT:   Her leg continues to swell once up.  She has stopped taking Oxy and is using Rx Ibuprofen or tylenol arthritis for pain.   PAIN:  Are you having pain? Yes Scale: 3/10 over last week lowest 2/10 & highest  6/10 Location: Rt anterior knee    OBJECTIVE: (objective measures completed at initial evaluation unless otherwise dated)  DIAGNOSTIC FINDINGS: 08/29/2022 X-ray Total knee arthroplasty. Prosthetic components are well seated. Expected soft tissue changes in the anterior kne   PATIENT SURVEYS:  FOTO 55% and predicted 74%   EDEMA:  RLE: above knee 58.3 cm  around knee 51cm  below knee 45.1cm LLE:: above knee 53.8cm  around knee 45.1cm  below knee  39cm   POSTURE: flexed trunk  and weight shift left   PALPATION: Tenderness along incision, patella and joint line.  Denies tenderness over quad, hamstring and gastroc.    LOWER EXTREMITY ROM:    ROM Right eval Rt 09/18/22 Right 09/21/22  Knee flexion Supine P: 68* Seated  A: 54* P: 65* Supine A: 58 P: 65  Supine AA: 76  Knee extension Seated A: LAQ -55* Quad set -9* P: -2* Supine A: -5 P: -2  A: -15 (seated LAQ)   (Blank rows = not tested)   LOWER EXTREMITY MMT:   MMT Right eval Left eval  Hip flexion      Hip extension      Hip abduction      Hip adduction      Hip internal rotation      Hip external rotation      Knee flexion 2/5    Knee extension 2-/5 Minimal quad activitation    Ankle dorsiflexion      Ankle plantarflexion      Ankle inversion      Ankle eversion       (Blank rows = not tested)     FUNCTIONAL TESTS:  18 inch chair transfer: requires use of BUEs to push up. Lt SLS: unable to perform as lifting RLE was difficult Rt SLS: unable Supine <> sit: has  to use LLE to manage RLE.    GAIT: Distance walked: 75' Assistive device utilized: Walker - 2 wheeled Level of assistance: SBA Comments: antalgic pattern with decreased RLE stance, knee flexed in stance, no knee flexion for swing, limited WB RLE due to pain & weakness.      TODAY'S TREATMENT                                                                          DATE:   09/26/2022: TherEx: Nustep seat 10 level 6 with BLEs/ BUEs x 5 minute then level 4 with BLEs only for 3 min Leg Press BLEs 81# 10 reps 2 sets with 5 se hold ext & flex Hamstring stretch strap SLR with PT manual assist for knee ext 30 sec hold 2 reps. Seated LAQ with PT assisting range >-45* PT tapping quads for facilitation.  Attempted isometric hold but PT unable to generate strong enough quad to hold.  10 reps. Supine SAQ with PT assisting end range & tapping quad for facilitation. End range hold with PT manual closed chain 5 sec hold.  10 reps.   Therapeutic Activities: Upon arising weight shift onto RLE for 3-5 sec hold 5 reps Pre-gait stepping over 2" step (10" long) working terminal stance & swing knee flexion and stance ext in initial contact / terminal stance for 2 min. Carry over of above   in gait with cane with PT demo & verbal cues.   Manual: [employer]volt to quads including with PROM but limited by pain.  Switched to contract-relax which she tolerated better.    Modalities: Vasopneumatic right knee 34 degrees x 10 minutes medium compression  09/21/22 TherEx: Nustep x 8 minutes level 5 c UE Seated Rt LAQ 3x10; 5 sec hold Supine AA Rt heel slide x 5 reps; then did 1 min holds for LLLD stretch with incremental increase in flexion as tolerated each minute x 3 min Long sitting quad sets x 5 reps - utilizing visual cues and performing Lt at the same time for improved quad activation on Rt.  Modalities: Vasopneumatic 34 degrees x 10 minutes low compression  09/18/22:  TherEx:  Nustep x 8 minutes level 4 c  UE Calf stretch on slant board: x 3 holding 30 seconds Step ups on 6 inch step x 10 leading with Rt c UE support LAQ: x 15 2#  Hamstring curls Level 3 band x 15 holding 3 sec Hamstring stretch: x 2 holding 30 sec Supine SAQ: x 15 holding 3 sec Manual:  PROM Rt knee flexion/extension Modalities: Vasopneumatic 34 degrees x 10 minutes low compression    PATIENT EDUCATION:  Education details: HEP, POC Person educated: Patient Education method: Explanation, Demonstration, Verbal cues, and Handouts Education comprehension: verbalized understanding, returned demonstration, and verbal cues required   HOME EXERCISE PROGRAM: Access Code: [email or web address] URL: [email or web address] Date: 09/17/2022 Prepared by: Robin Waldron   Exercises - Ankle Alphabet in Elevation  - 2-4 x daily - 7 x weekly - 1 sets - 1 reps - Quad Setting and Stretching  - 2-4 x daily - 7 x weekly - 5-10 sets - 10 reps - prop 5-10 minutes & quad set5 seconds hold - Supine Heel Slide with Strap  - 2-3 x daily - 7 x weekly - 2-3 sets - 10 reps - 5 seconds hold - Supine Knee Extension Strengthening  - 2-3 x daily - 7 x weekly - 2-3 sets - 10 reps - 5 seconds hold - Supine Straight Leg Raises  - 2-3 x daily - 7 x weekly - 2-3 sets - 10 reps - 5 seconds hold - Seated Knee Flexion Extension AROM   - 2-4 x daily - 7 x weekly - 2-3 sets - 10 reps - 5 seconds hold - Seated Hamstring Stretch with Strap  - 2-4 x daily - 7 x weekly - 1 sets - 3 reps - 20-30 seconds hold   ASSESSMENT:   CLINICAL IMPRESSION: Patient is able to activate quads better with closed chain exercises. She is still struggling to activate quads with open chain exercises.  Her range is limited by pain currently. Pt continues to benefit from PT.    OBJECTIVE IMPAIRMENTS: Abnormal gait, decreased activity tolerance, decreased balance, decreased endurance, decreased knowledge of use of DME, decreased mobility, difficulty walking, decreased ROM, decreased  strength, increased edema, increased muscle spasms, impaired flexibility, postural dysfunction, and pain.    ACTIVITY LIMITATIONS: carrying, lifting, bending, sitting, standing, squatting, sleeping, stairs, transfers, bed mobility, and locomotion level   PARTICIPATION LIMITATIONS: meal prep, cleaning, laundry, driving, community activity, occupation, and church   PERSONAL FACTORS: Profession and 1 comorbidity: see PMH  are also affecting patient's functional outcome.    REHAB POTENTIAL: Good   CLINICAL DECISION MAKING: Stable/uncomplicated   EVALUATION COMPLEXITY: Low     GOALS: Goals reviewed with patient? Yes   SHORT TERM GOALS: (target date for Short term   goals 10/19/2022)    1.  Patient will demonstrate independent use of home exercise program to maintain progress from in clinic treatments.   Goal status: On-going 09/18/22   LONG TERM GOALS: (target dates for all long term goals are 10 weeks  11/23/2022 )   1. Patient will demonstrate/report pain at worst less than or equal to 2/10 to facilitate minimal limitation in daily activity secondary to pain symptoms.   Goal status: New   2. Patient will demonstrate independent use of home exercise program to facilitate ability to maintain/progress functional gains from skilled physical therapy services.   Goal status: New   3. Patient will demonstrate FOTO outcome > or = 74 % to indicate reduced disability due to condition.   Goal status: New   4.  Patient will demonstrate right LE MMT 5/5 throughout to faciltiate usual transfers, stairs, squatting at P[facility] for daily life.    Goal status: New   5.  Patient PROM right knee 0* - 110* Goal status: New   6.  Patient ambulates >500' negotiates ramps, curbs & stairs single rail without device independently. Goal status: New   7.  patient verbalizes understanding of ongoing HEP.  Goal Status: New     PLAN:   PT FREQUENCY: 2-3x/wk    PT DURATION: 10 weeks   PLANNED  INTERVENTIONS: Therapeutic exercises, Therapeutic activity, Neuromuscular re-education, Balance training, Gait training, Patient/Family education, Self Care, Joint mobilization, Stair training, Vestibular training, DME instructions, Dry Needling, Electrical stimulation, Cryotherapy, Moist heat, scar mobilization, Taping, Vasopneumatic device, Ultrasound, Manual therapy, and Re-evaluation   PLAN FOR NEXT SESSION:   manual therapy & exercise for range, needs quad focus for extension try closed chain to facilitate quads, end with vaso.      Robin Waldron, PT, DPT 09/26/2022, 1:02 PM     

## 2022-09-27 NOTE — Therapy (Signed)
OUTPATIENT PHYSICAL THERAPY TREATMENT NOTE   Patient Name: Shelby Espinoza MRN: [id number] DOB:June 28, 1966, 56 y.o., female Today's Date: 09/28/2022  PCP:  Dorothyann Peng, NP   REFERRING PROVIDER:  Leandrew Koyanagi, MD   END OF SESSION:   PT End of Session - 09/28/22 1308     Visit Number 5    Number of Visits 22    Date for PT Re-Evaluation 11/23/22    Authorization Type [employer] Employee    Authorization Time Period $25 COPAY, DED MET, OOP Individual FEP BLUE FOCUS Remaining $[phone]    Progress Note Due on Visit 10    PT Start Time 1310   pt late arrival   PT Stop Time 1345    PT Time Calculation (min) 35 min    Activity Tolerance Patient tolerated treatment well;Patient limited by pain;Patient limited by fatigue    Behavior During Therapy [facility] for tasks assessed/performed               Past Medical History:  Diagnosis Date   Arthritis    GERD (gastroesophageal reflux disease)    Hemorrhoids    Hypothyroidism    Past Surgical History:  Procedure Laterality Date   APPENDECTOMY  1985   BREAST REDUCTION SURGERY Bilateral [place]   COLONOSCOPY  2022   TOTAL KNEE ARTHROPLASTY Right 08/29/2022   Procedure: RIGHT TOTAL KNEE ARTHROPLASTY;  Surgeon: Leandrew Koyanagi, MD;  Location: [place];  Service: Orthopedics;  Laterality: Right;   TUBAL LIGATION  1998   Patient Active Problem List   Diagnosis Date Noted   Status post total right knee replacement 08/29/2022   Primary osteoarthritis of right knee 07/25/2022   Viral URI 05/17/2018   Hypothyroidism 05/02/2007    REFERRING DIAG:  Z96.651 (ICD-10-CM) - Status post total right knee replacement    THERAPY DIAG:  Acute pain of right knee  Stiffness of right knee, not elsewhere classified  Muscle weakness (generalized)  Localized edema  Other abnormalities of gait and mobility  Unsteadiness on feet  Rationale for Evaluation and Treatment Rehabilitation  PERTINENT HISTORY: OA,  breast reduction sg   PRECAUTIONS: none  SUBJECTIVE:                                                                                                                                                                                      SUBJECTIVE STATEMENT:   S/p R TKA 08/29/22  Pt arrives w/ 2-3/10 pain on NPS, denies issues after last session. Took tylenol before session. Notes her biggest trouble is stiffness/pain after inactivity, swelling.    PAIN:  Are  you having pain? Yes Scale: 3/10 over last week lowest 2/10 & highest 6/10 Location: Rt anterior knee    OBJECTIVE: (objective measures completed at initial evaluation unless otherwise dated)  DIAGNOSTIC FINDINGS: 08/29/2022 X-ray Total knee arthroplasty. Prosthetic components are well seated. Expected soft tissue changes in the anterior kne   PATIENT SURVEYS:  FOTO 55% and predicted 74%   EDEMA:  RLE: above knee 58.3 cm  around knee 51cm  below knee 45.1cm LLE:: above knee 53.8cm  around knee 45.1cm  below knee  39cm   POSTURE: flexed trunk  and weight shift left   PALPATION: Tenderness along incision, patella and joint line.  Denies tenderness over quad, hamstring and gastroc.    LOWER EXTREMITY ROM:    ROM Right eval Rt 09/18/22 Right 09/21/22 Right 09/28/22  Knee flexion Supine P: 68* Seated  A: 54* P: 65* Supine A: 58 P: 65  Supine AA: 76 Supine AA: 80 deg   Knee extension Seated A: LAQ -55* Quad set -9* P: -2* Supine A: -5 P: -2  A: -15 (seated LAQ)    (Blank rows = not tested)   LOWER EXTREMITY MMT:   MMT Right eval Left eval  Hip flexion      Hip extension      Hip abduction      Hip adduction      Hip internal rotation      Hip external rotation      Knee flexion 2/5    Knee extension 2-/5 Minimal quad activitation    Ankle dorsiflexion      Ankle plantarflexion      Ankle inversion      Ankle eversion       (Blank rows = not tested)     FUNCTIONAL TESTS:  18 inch chair  transfer: requires use of BUEs to push up. Lt SLS: unable to perform as lifting RLE was difficult Rt SLS: unable Supine <> sit: has to use LLE to manage RLE.    GAIT: Distance walked: 37' Assistive device utilized: Environmental consultant - 2 wheeled Level of assistance: SBA Comments: antalgic pattern with decreased RLE stance, knee flexed in stance, no knee flexion for swing, limited WB RLE due to pain & weakness.      TODAY'S TREATMENT [facility] Adult PT Treatment:                                                DATE: 09/28/22  Therapeutic Exercise: Nustep LE/UE 58mn during subjective STS raised mat, 2x10, cues for appropriate mechanics and trunk lean (significant time spent educating on mechanics and rationale) 2inch step up + RTB TKE x12 cues for form and appropriate quad extension TKE w/ heel prop, pt in long sitting, x12 cues for form and tactile/verbal cues as needed   Supine heel slides x5 with strap and towel, 10sec hold  HEP review/education                                         DATE:   09/26/2022: TherEx: Nustep seat 10 level 6 with BLEs/ BUEs x 5 minute then level 4 with BLEs only for 3 min Leg Press BLEs 81# 10 reps 2 sets with 5 se hold ext & flex Hamstring stretch strap  SLR with PT manual assist for knee ext 30 sec hold 2 reps. Seated LAQ with PT assisting range >-45* PT tapping quads for facilitation.  Attempted isometric hold but PT unable to generate strong enough quad to hold.  10 reps. Supine SAQ with PT assisting end range & tapping quad for facilitation. End range hold with PT manual closed chain 5 sec hold.  10 reps.   Therapeutic Activities: Upon arising weight shift onto RLE for 3-5 sec hold 5 reps Pre-gait stepping over 2" step (10" long) working terminal stance & swing knee flexion and stance ext in initial contact / terminal stance for 2 min. Carry over of above in gait with cane with PT demo & verbal cues.   Manual: Hypervolt to quads including with PROM but limited by  pain.  Switched to contract-relax which she tolerated better.    Modalities: Vasopneumatic right knee 34 degrees x 10 minutes medium compression  09/21/22 TherEx: Nustep x 8 minutes level 5 c UE Seated Rt LAQ 3x10; 5 sec hold Supine AA Rt heel slide x 5 reps; then did 1 min holds for LLLD stretch with incremental increase in flexion as tolerated each minute x 3 min Long sitting quad sets x 5 reps - utilizing visual cues and performing Lt at the same time for improved quad activation on Rt.  Modalities: Vasopneumatic 34 degrees x 10 minutes low compression  09/18/22:  TherEx:  Nustep x 8 minutes level 4 c UE Calf stretch on slant board: x 3 holding 30 seconds Step ups on 6 inch step x 10 leading with Rt c UE support LAQ: x 15 2#  Hamstring curls Level 3 band x 15 holding 3 sec Hamstring stretch: x 2 holding 30 sec Supine SAQ: x 15 holding 3 sec Manual:  PROM Rt knee flexion/extension Modalities: Vasopneumatic 34 degrees x 10 minutes low compression    PATIENT EDUCATION:  Education details: rationale for interventions, HEP Person educated: Patient Education method: Consulting civil engineer, Media planner, Verbal cues, and Handouts Education comprehension: verbalized understanding, returned demonstration, and verbal cues required   HOME EXERCISE PROGRAM: Access Code: [email or web address] URL: [email or web address] Date: 09/17/2022 Prepared by: Jamey Reas   Exercises - Ankle Alphabet in Elevation  - 2-4 x daily - 7 x weekly - 1 sets - 1 reps - Quad Setting and Stretching  - 2-4 x daily - 7 x weekly - 5-10 sets - 10 reps - prop 5-10 minutes & quad set5 seconds hold - Supine Heel Slide with Strap  - 2-3 x daily - 7 x weekly - 2-3 sets - 10 reps - 5 seconds hold - Supine Knee Extension Strengthening  - 2-3 x daily - 7 x weekly - 2-3 sets - 10 reps - 5 seconds hold - Supine Straight Leg Raises  - 2-3 x daily - 7 x weekly - 2-3 sets - 10 reps - 5 seconds hold - Seated Knee Flexion  Extension AROM   - 2-4 x daily - 7 x weekly - 2-3 sets - 10 reps - 5 seconds hold - Seated Hamstring Stretch with Strap  - 2-4 x daily - 7 x weekly - 1 sets - 3 reps - 20-30 seconds hold   ASSESSMENT:   CLINICAL IMPRESSION: Pt arrives w/ 2-3/10 pain on NPS, denies any significant issues after last session. Session shortened due to pt late arrival. Session emphasizing quad activation as pt continues to have difficulty with full knee extension, unable to perform SLR. Quad activation improves with repetition and addition  of banded TKE with step. Modest improvement in knee flexion as above, remains limited. Pt also requests visual inspection of incision (appropriate draping techniques employed), appears to be healing well without issue, steri strips remain on distal portion, moderate swelling present. No adverse events, pt denies any overt change in pain on departure, mostly muscular fatigue. Pt departs today's session in no acute distress, all voiced questions/concerns addressed appropriately from PT perspective.    OBJECTIVE IMPAIRMENTS: Abnormal gait, decreased activity tolerance, decreased balance, decreased endurance, decreased knowledge of use of DME, decreased mobility, difficulty walking, decreased ROM, decreased strength, increased edema, increased muscle spasms, impaired flexibility, postural dysfunction, and pain.    ACTIVITY LIMITATIONS: carrying, lifting, bending, sitting, standing, squatting, sleeping, stairs, transfers, bed mobility, and locomotion level   PARTICIPATION LIMITATIONS: meal prep, cleaning, laundry, driving, community activity, occupation, and church   PERSONAL FACTORS: Profession and 1 comorbidity: see PMH  are also affecting patient's functional outcome.    REHAB POTENTIAL: Good   CLINICAL DECISION MAKING: Stable/uncomplicated   EVALUATION COMPLEXITY: Low     GOALS: Goals reviewed with patient? Yes   SHORT TERM GOALS: (target date for Short term goals 10/19/2022)     1.  Patient will demonstrate independent use of home exercise program to maintain progress from in clinic treatments.   Goal status: On-going 09/18/22   LONG TERM GOALS: (target dates for all long term goals are 10 weeks  11/23/2022 )   1. Patient will demonstrate/report pain at worst less than or equal to 2/10 to facilitate minimal limitation in daily activity secondary to pain symptoms.   Goal status: New   2. Patient will demonstrate independent use of home exercise program to facilitate ability to maintain/progress functional gains from skilled physical therapy services.   Goal status: New   3. Patient will demonstrate FOTO outcome > or = 74 % to indicate reduced disability due to condition.   Goal status: New   4.  Patient will demonstrate right LE MMT 5/5 throughout to faciltiate usual transfers, stairs, squatting at [facility] for daily life.    Goal status: New   5.  Patient PROM right knee 0* - 110* Goal status: New   6.  Patient ambulates >500' negotiates ramps, curbs & stairs single rail without device independently. Goal status: New   7.  patient verbalizes understanding of ongoing HEP.  Goal Status: New     PLAN:   PT FREQUENCY: 2-3x/wk    PT DURATION: 10 weeks   PLANNED INTERVENTIONS: Therapeutic exercises, Therapeutic activity, Neuromuscular re-education, Balance training, Gait training, Patient/Family education, Self Care, Joint mobilization, Stair training, Vestibular training, DME instructions, Dry Needling, Electrical stimulation, Cryotherapy, Moist heat, scar mobilization, Taping, Vasopneumatic device, Ultrasound, Manual therapy, and Re-evaluation   PLAN FOR NEXT SESSION:   manual therapy for mobility, open/closed chain quad activation and knee flexion ROM. Does well with addition of banded TKE on 09/28/22      Leeroy Cha PT, DPT 09/28/2022 2:25 PM

## 2022-09-28 ENCOUNTER — Encounter: Payer: Self-pay | Admitting: Physical Therapy

## 2022-09-28 ENCOUNTER — Ambulatory Visit: Payer: Federal, State, Local not specified - PPO | Admitting: Physical Therapy

## 2022-09-28 DIAGNOSIS — R2681 Unsteadiness on feet: Secondary | ICD-10-CM

## 2022-09-28 DIAGNOSIS — R2689 Other abnormalities of gait and mobility: Secondary | ICD-10-CM

## 2022-09-28 DIAGNOSIS — M6281 Muscle weakness (generalized): Secondary | ICD-10-CM

## 2022-09-28 DIAGNOSIS — R600 Localized edema: Secondary | ICD-10-CM

## 2022-09-28 DIAGNOSIS — M25661 Stiffness of right knee, not elsewhere classified: Secondary | ICD-10-CM | POA: Diagnosis not present

## 2022-09-28 DIAGNOSIS — M25561 Pain in right knee: Secondary | ICD-10-CM

## 2022-10-02 ENCOUNTER — Encounter: Payer: Self-pay | Admitting: Physical Therapy

## 2022-10-02 ENCOUNTER — Ambulatory Visit: Payer: Federal, State, Local not specified - PPO | Admitting: Physical Therapy

## 2022-10-02 DIAGNOSIS — M25561 Pain in right knee: Secondary | ICD-10-CM

## 2022-10-02 DIAGNOSIS — R600 Localized edema: Secondary | ICD-10-CM | POA: Diagnosis not present

## 2022-10-02 DIAGNOSIS — R2681 Unsteadiness on feet: Secondary | ICD-10-CM

## 2022-10-02 DIAGNOSIS — R2689 Other abnormalities of gait and mobility: Secondary | ICD-10-CM | POA: Diagnosis not present

## 2022-10-02 DIAGNOSIS — M6281 Muscle weakness (generalized): Secondary | ICD-10-CM

## 2022-10-02 DIAGNOSIS — M25661 Stiffness of right knee, not elsewhere classified: Secondary | ICD-10-CM

## 2022-10-02 NOTE — Therapy (Signed)
OUTPATIENT PHYSICAL THERAPY TREATMENT NOTE   Patient Name: Shelby Espinoza MRN: [id number] DOB:Nov 03, 1965, 57 y.o., female Today's Date: 10/02/2022  PCP:  Dorothyann Peng, NP   REFERRING PROVIDER:  Leandrew Koyanagi, MD   END OF SESSION:   PT End of Session - 10/02/22 1113     Visit Number 6    Number of Visits 22    Date for PT Re-Evaluation 11/23/22    Authorization Type [employer] Employee    Authorization Time Period $25 COPAY, DED MET, OOP Individual FEP BLUE FOCUS Remaining $[phone]    Progress Note Due on Visit 10    PT Start Time 1110    PT Stop Time 1158    PT Time Calculation (min) 48 min    Activity Tolerance Patient tolerated treatment well;Patient limited by pain;Patient limited by fatigue    Behavior During Therapy [facility] for tasks assessed/performed                Past Medical History:  Diagnosis Date   Arthritis    GERD (gastroesophageal reflux disease)    Hemorrhoids    Hypothyroidism    Past Surgical History:  Procedure Laterality Date   APPENDECTOMY  1985   BREAST REDUCTION SURGERY Bilateral [place]   COLONOSCOPY  2022   TOTAL KNEE ARTHROPLASTY Right 08/29/2022   Procedure: RIGHT TOTAL KNEE ARTHROPLASTY;  Surgeon: Leandrew Koyanagi, MD;  Location: [place];  Service: Orthopedics;  Laterality: Right;   TUBAL LIGATION  1998   Patient Active Problem List   Diagnosis Date Noted   Status post total right knee replacement 08/29/2022   Primary osteoarthritis of right knee 07/25/2022   Viral URI 05/17/2018   Hypothyroidism 05/02/2007    REFERRING DIAG:  M09.470 (ICD-10-CM) - Status post total right knee replacement  08/29/22  THERAPY DIAG:  Acute pain of right knee  Stiffness of right knee, not elsewhere classified  Localized edema  Other abnormalities of gait and mobility  Unsteadiness on feet  Muscle weakness (generalized)  Rationale for Evaluation and Treatment Rehabilitation  PERTINENT HISTORY: OA, breast  reduction sg   PRECAUTIONS: none  SUBJECTIVE:                                                                                                                                                                                      SUBJECTIVE STATEMENT:   Her knee continues to swell limiting her knee motion.  She continues to do her exercises.   PAIN:  Are you having pain? Yes Scale: today 4/10 & over last week lowest 2/10 & highest 5/10 Location: Rt  anterior knee Aggravating: standing, walking & swelling Alleviating: elevation, ice, meds   OBJECTIVE: (objective measures completed at initial evaluation unless otherwise dated)  DIAGNOSTIC FINDINGS: 08/29/2022 X-ray Total knee arthroplasty. Prosthetic components are well seated. Expected soft tissue changes in the anterior kne   PATIENT SURVEYS:  FOTO 55% and predicted 74%   EDEMA:  RLE: above knee 58.3 cm  around knee 51cm  below knee 45.1cm LLE:: above knee 53.8cm  around knee 45.1cm  below knee  39cm   POSTURE: flexed trunk  and weight shift left   PALPATION: Tenderness along incision, patella and joint line.  Denies tenderness over quad, hamstring and gastroc.    LOWER EXTREMITY ROM:    ROM Right eval Rt 09/18/22 Right 09/21/22 Right 09/28/22  Knee flexion Supine P: 68* Seated  A: 54* P: 65* Supine A: 58 P: 65  Supine AA: 76 Supine AA: 80 deg   Knee extension Seated A: LAQ -55* Quad set -9* P: -2* Supine A: -5 P: -2  A: -15 (seated LAQ)    (Blank rows = not tested)   LOWER EXTREMITY MMT:   MMT Right eval Left eval  Hip flexion      Hip extension      Hip abduction      Hip adduction      Hip internal rotation      Hip external rotation      Knee flexion 2/5    Knee extension 2-/5 Minimal quad activitation    Ankle dorsiflexion      Ankle plantarflexion      Ankle inversion      Ankle eversion       (Blank rows = not tested)     FUNCTIONAL TESTS:  18 inch chair transfer: requires use of BUEs to  push up. Lt SLS: unable to perform as lifting RLE was difficult Rt SLS: unable Supine <> sit: has to use LLE to manage RLE.    GAIT: Distance walked: 1' Assistive device utilized: Environmental consultant - 2 wheeled Level of assistance: SBA Comments: antalgic pattern with decreased RLE stance, knee flexed in stance, no knee flexion for swing, limited WB RLE due to pain & weakness.      TODAY'S TREATMENT [facility] Adult PT Treatment:                                                DATE:  10/02/2022: Therapeutic Exercise: SciFit Bike seat 7 rocking with BLEs/BUEs for flexion stretch 8 min Gastroc stretch on incline board 30 sec hold 1 rep Heel raises on incline board 10 reps Leg Press BLEs 81# 10 reps 2 sets with 5 se hold ext & flex Hamstring stretch strap SLR with PT manual assist for knee ext 30 sec hold 2 reps. Forefoot on 2 inch step to facilitate greater ext with Red TheraBand TKE x12 reps with 5 sec hold cues for form and appropriate quad extension Supine heel slides x5 with strap and towel, 10sec hold   Manual Therapy: PROM seated knee flexion with painful end feel; pt reports even with hold pain does not subside.   Soft tissue mobs to medial knee area which pt reports greatest pain  Therapeutic Activities Pt verbally educated on positioning in bed to "tent" sheets off feet and pillow bw LEs in sidelying. Pt verbalized understanding. PT verbally educated on getting in/out  of car.  Begin to practice moving RLE gas to break slow, cautious initially and progress to rapid sudden stop. Pt verbalized understanding.  Modalities: Vasopneumatic right knee 34 degrees x 10 minutes medium compression  09/28/22  Therapeutic Exercise: Nustep LE/UE 49mn during subjective STS raised mat, 2x10, cues for appropriate mechanics and trunk lean (significant time spent educating on mechanics and rationale) 2inch step up + RTB TKE x12 cues for form and appropriate quad extension TKE w/ heel prop, pt in long sitting,  x12 cues for form and tactile/verbal cues as needed   Supine heel slides x5 with strap and towel, 10sec hold  HEP review/education   09/26/2022: TherEx: Nustep seat 10 level 6 with BLEs/ BUEs x 5 minute then level 4 with BLEs only for 3 min Leg Press BLEs 81# 10 reps 2 sets with 5 se hold ext & flex Hamstring stretch strap SLR with PT manual assist for knee ext 30 sec hold 2 reps. Seated LAQ with PT assisting range >-45* PT tapping quads for facilitation.  Attempted isometric hold but PT unable to generate strong enough quad to hold.  10 reps. Supine SAQ with PT assisting end range & tapping quad for facilitation. End range hold with PT manual closed chain 5 sec hold.  10 reps.   Therapeutic Activities: Upon arising weight shift onto RLE for 3-5 sec hold 5 reps Pre-gait stepping over 2" step (10" long) working terminal stance & swing knee flexion and stance ext in initial contact / terminal stance for 2 min. Carry over of above in gait with cane with PT demo & verbal cues.   Manual: Hypervolt to quads including with PROM but limited by pain.  Switched to contract-relax which she tolerated better.    Modalities: Vasopneumatic right knee 34 degrees x 10 minutes medium compression    PATIENT EDUCATION:  Education details: rationale for interventions, HEP Person educated: Patient Education method: EConsulting civil engineer Demonstration, Verbal cues, and Handouts Education comprehension: verbalized understanding, returned demonstration, and verbal cues required   HOME EXERCISE PROGRAM: Access Code: [email or web address] URL: [email or web address] Date: 09/17/2022 Prepared by: RJamey Reas  Exercises - Ankle Alphabet in Elevation  - 2-4 x daily - 7 x weekly - 1 sets - 1 reps - Quad Setting and Stretching  - 2-4 x daily - 7 x weekly - 5-10 sets - 10 reps - prop 5-10 minutes & quad set5 seconds hold - Supine Heel Slide with Strap  - 2-3 x daily - 7 x weekly - 2-3 sets - 10 reps - 5 seconds  hold - Supine Knee Extension Strengthening  - 2-3 x daily - 7 x weekly - 2-3 sets - 10 reps - 5 seconds hold - Supine Straight Leg Raises  - 2-3 x daily - 7 x weekly - 2-3 sets - 10 reps - 5 seconds hold - Seated Knee Flexion Extension AROM   - 2-4 x daily - 7 x weekly - 2-3 sets - 10 reps - 5 seconds hold - Seated Hamstring Stretch with Strap  - 2-4 x daily - 7 x weekly - 1 sets - 3 reps - 20-30 seconds hold   ASSESSMENT:   CLINICAL IMPRESSION: Pt continues to be limited by pain, edema and weakness.  Her range improves with PT exercises and manual therapy.      OBJECTIVE IMPAIRMENTS: Abnormal gait, decreased activity tolerance, decreased balance, decreased endurance, decreased knowledge of use of DME, decreased mobility, difficulty walking, decreased ROM, decreased strength, increased edema, increased muscle spasms,  impaired flexibility, postural dysfunction, and pain.    ACTIVITY LIMITATIONS: carrying, lifting, bending, sitting, standing, squatting, sleeping, stairs, transfers, bed mobility, and locomotion level   PARTICIPATION LIMITATIONS: meal prep, cleaning, laundry, driving, community activity, occupation, and church   PERSONAL FACTORS: Profession and 1 comorbidity: see PMH  are also affecting patient's functional outcome.    REHAB POTENTIAL: Good   CLINICAL DECISION MAKING: Stable/uncomplicated   EVALUATION COMPLEXITY: Low     GOALS: Goals reviewed with patient? Yes   SHORT TERM GOALS: (target date for Short term goals 10/19/2022)    1.  Patient will demonstrate independent use of home exercise program to maintain progress from in clinic treatments.   Goal status: On-going 09/18/22   LONG TERM GOALS: (target dates for all long term goals are 10 weeks  11/23/2022 )   1. Patient will demonstrate/report pain at worst less than or equal to 2/10 to facilitate minimal limitation in daily activity secondary to pain symptoms.   Goal status: New   2. Patient will demonstrate  independent use of home exercise program to facilitate ability to maintain/progress functional gains from skilled physical therapy services.   Goal status: New   3. Patient will demonstrate FOTO outcome > or = 74 % to indicate reduced disability due to condition.   Goal status: New   4.  Patient will demonstrate right LE MMT 5/5 throughout to faciltiate usual transfers, stairs, squatting at [facility] for daily life.    Goal status: New   5.  Patient PROM right knee 0* - 110* Goal status: New   6.  Patient ambulates >500' negotiates ramps, curbs & stairs single rail without device independently. Goal status: New   7.  patient verbalizes understanding of ongoing HEP.  Goal Status: New     PLAN:   PT FREQUENCY: 2-3x/wk    PT DURATION: 10 weeks   PLANNED INTERVENTIONS: Therapeutic exercises, Therapeutic activity, Neuromuscular re-education, Balance training, Gait training, Patient/Family education, Self Care, Joint mobilization, Stair training, Vestibular training, DME instructions, Dry Needling, Electrical stimulation, Cryotherapy, Moist heat, scar mobilization, Taping, Vasopneumatic device, Ultrasound, Manual therapy, and Re-evaluation   PLAN FOR NEXT SESSION:   instruct in ice massage & scar mobs, continue manual therapy & exercise for range, vaso to end prn    Jamey Reas, PT, DPT 10/02/2022, 12:49 PM

## 2022-10-04 ENCOUNTER — Ambulatory Visit: Payer: Federal, State, Local not specified - PPO | Admitting: Physical Therapy

## 2022-10-04 ENCOUNTER — Encounter: Payer: Self-pay | Admitting: Physical Therapy

## 2022-10-04 DIAGNOSIS — M25561 Pain in right knee: Secondary | ICD-10-CM

## 2022-10-04 DIAGNOSIS — M25661 Stiffness of right knee, not elsewhere classified: Secondary | ICD-10-CM

## 2022-10-04 DIAGNOSIS — R2689 Other abnormalities of gait and mobility: Secondary | ICD-10-CM | POA: Diagnosis not present

## 2022-10-04 DIAGNOSIS — R2681 Unsteadiness on feet: Secondary | ICD-10-CM

## 2022-10-04 DIAGNOSIS — R600 Localized edema: Secondary | ICD-10-CM

## 2022-10-04 DIAGNOSIS — M6281 Muscle weakness (generalized): Secondary | ICD-10-CM

## 2022-10-04 NOTE — Therapy (Signed)
OUTPATIENT PHYSICAL THERAPY TREATMENT NOTE   Patient Name: Shelby Espinoza MRN: [id number] DOB:07/18/1966, 57 y.o., female Today's Date: 10/04/2022  PCP:  Shelby Peng, NP   REFERRING PROVIDER:  Leandrew Koyanagi, MD   END OF SESSION:   PT End of Session - 10/04/22 1056     Visit Number 7    Number of Visits 22    Date for PT Re-Evaluation 11/23/22    Authorization Type [employer] Employee    Authorization Time Period $25 COPAY, DED MET, OOP Individual FEP BLUE FOCUS Remaining $[phone]    Progress Note Due on Visit 10    PT Start Time 1056    PT Stop Time 1145    PT Time Calculation (min) 49 min    Activity Tolerance Patient tolerated treatment well;Patient limited by pain;Patient limited by fatigue    Behavior During Therapy [facility] for tasks assessed/performed                 Past Medical History:  Diagnosis Date   Arthritis    GERD (gastroesophageal reflux disease)    Hemorrhoids    Hypothyroidism    Past Surgical History:  Procedure Laterality Date   APPENDECTOMY  1985   BREAST REDUCTION SURGERY Bilateral [place]   COLONOSCOPY  2022   TOTAL KNEE ARTHROPLASTY Right 08/29/2022   Procedure: RIGHT TOTAL KNEE ARTHROPLASTY;  Surgeon: Leandrew Koyanagi, MD;  Location: [place];  Service: Orthopedics;  Laterality: Right;   TUBAL LIGATION  1998   Patient Active Problem List   Diagnosis Date Noted   Status post total right knee replacement 08/29/2022   Primary osteoarthritis of right knee 07/25/2022   Viral URI 05/17/2018   Hypothyroidism 05/02/2007    REFERRING DIAG:  H84.696 (ICD-10-CM) - Status post total right knee replacement  08/29/22  THERAPY DIAG:  Acute pain of right knee  Stiffness of right knee, not elsewhere classified  Localized edema  Other abnormalities of gait and mobility  Unsteadiness on feet  Muscle weakness (generalized)  Rationale for Evaluation and Treatment Rehabilitation  PERTINENT HISTORY: OA, breast  reduction sg   PRECAUTIONS: none  SUBJECTIVE:                                                                                                                                                                                      SUBJECTIVE STATEMENT:   She continues to do her exercises.   PAIN:  Are you having pain? Yes Scale: today 3/10 & since last PT lowest 2/10 & highest 4/10 Location: Rt anterior knee Aggravating: standing, walking & swelling Alleviating: elevation,  ice, meds   OBJECTIVE: (objective measures completed at initial evaluation unless otherwise dated)  DIAGNOSTIC FINDINGS: 08/29/2022 X-ray Total knee arthroplasty. Prosthetic components are well seated. Expected soft tissue changes in the anterior kne   PATIENT SURVEYS:  FOTO 55% and predicted 74%   EDEMA:  RLE: above knee 58.3 cm  around knee 51cm  below knee 45.1cm LLE:: above knee 53.8cm  around knee 45.1cm  below knee  39cm   POSTURE: flexed trunk  and weight shift left   PALPATION: Tenderness along incision, patella and joint line.  Denies tenderness over quad, hamstring and gastroc.    LOWER EXTREMITY ROM:    ROM Right eval Rt 09/18/22 Right 09/21/22 Right 09/28/22 Right 10/04/22  Knee flexion Supine P: 68* Seated  A: 54* P: 65* Supine A: 58 P: 65  Supine AA: 76 Supine AA: 80 deg  Supine P: 83* A: 71*  Knee extension Seated A: LAQ -55* Quad set -9* P: -2* Supine A: -5 P: -2  A: -15 (seated LAQ)     (Blank rows = not tested)   LOWER EXTREMITY MMT:   MMT Right eval Left eval  Hip flexion      Hip extension      Hip abduction      Hip adduction      Hip internal rotation      Hip external rotation      Knee flexion 2/5    Knee extension 2-/5 Minimal quad activitation    Ankle dorsiflexion      Ankle plantarflexion      Ankle inversion      Ankle eversion       (Blank rows = not tested)     FUNCTIONAL TESTS:  18 inch chair transfer: requires use of BUEs to push up. Lt SLS:  unable to perform as lifting RLE was difficult Rt SLS: unable Supine <> sit: has to use LLE to manage RLE.    GAIT: Distance walked: 13' Assistive device utilized: Environmental consultant - 2 wheeled Level of assistance: SBA Comments: antalgic pattern with decreased RLE stance, knee flexed in stance, no knee flexion for swing, limited WB RLE due to pain & weakness.      TODAY'S TREATMENT [facility] Adult PT Treatment:                                                DATE:  10/04/2022: Therapeutic Exercise: SciFit Bike seat 7 rocking with BLEs/BUEs for flexion stretch 8 min Gastroc stretch on step heel depression 30 sec hold 2 reps Heel raises 10 reps Step ups 4" step RLE 10 reps with BUE support Leg Press BLEs 87# 10 reps 2 sets with 5 sec hold ext & flex Hamstring stretch strap SLR with PT manual assist for knee ext 30 sec hold 2 reps. Bridge BLE working feet closer to trunk in up position every other rep for knee flexion stretch 10 reps  Gait Training:  Pt amb with cane with PT demo & verbal cues on knee flexion in terminal stance/swing and ext in stance  Neuromuscular Re-ed: Tandem stance on foam 30 sec RLE in front & in back with intermittent UE touch  Self-Care: PT instructed in ice massage with demo & verbal cues. Pt verbalized & return demo understanding.  PT instructed in scar mobs with demo & verbal cues. Pt verbalized understanding.  Manual Therapy: PROM seated knee flexion with painful end feel; pt reports even with hold pain does not subside.     10/02/2022: Therapeutic Exercise: SciFit Bike seat 7 rocking with BLEs/BUEs for flexion stretch 8 min Gastroc stretch on incline board 30 sec hold 1 rep Heel raises on incline board 10 reps Leg Press BLEs 81# 10 reps 2 sets with 5 se hold ext & flex Hamstring stretch strap SLR with PT manual assist for knee ext 30 sec hold 2 reps. Forefoot on 2 inch step to facilitate greater ext with Red TheraBand TKE x12 reps with 5 sec hold cues for form and  appropriate quad extension Supine heel slides x5 with strap and towel, 10sec hold   Manual Therapy: PROM seated knee flexion with painful end feel; pt reports even with hold pain does not subside.   Soft tissue mobs to medial knee area which pt reports greatest pain  Therapeutic Activities Pt verbally educated on positioning in bed to "tent" sheets off feet and pillow bw LEs in sidelying. Pt verbalized understanding. PT verbally educated on getting in/out of car.  Begin to practice moving RLE gas to break slow, cautious initially and progress to rapid sudden stop. Pt verbalized understanding.  Modalities: Vasopneumatic right knee 34 degrees x 10 minutes medium compression  09/28/22  Therapeutic Exercise: Nustep LE/UE 52mn during subjective STS raised mat, 2x10, cues for appropriate mechanics and trunk lean (significant time spent educating on mechanics and rationale) 2inch step up + RTB TKE x12 cues for form and appropriate quad extension TKE w/ heel prop, pt in long sitting, x12 cues for form and tactile/verbal cues as needed   Supine heel slides x5 with strap and towel, 10sec hold  HEP review/education    PATIENT EDUCATION:  Education details: rationale for interventions, HEP Person educated: Patient Education method: EConsulting civil engineer Demonstration, Verbal cues, and Handouts Education comprehension: verbalized understanding, returned demonstration, and verbal cues required   HOME EXERCISE PROGRAM: Access Code: [email or web address] URL: [email or web address] Date: 09/17/2022 Prepared by: RJamey Reas  Exercises - Ankle Alphabet in Elevation  - 2-4 x daily - 7 x weekly - 1 sets - 1 reps - Quad Setting and Stretching  - 2-4 x daily - 7 x weekly - 5-10 sets - 10 reps - prop 5-10 minutes & quad set5 seconds hold - Supine Heel Slide with Strap  - 2-3 x daily - 7 x weekly - 2-3 sets - 10 reps - 5 seconds hold - Supine Knee Extension Strengthening  - 2-3 x daily - 7 x weekly - 2-3  sets - 10 reps - 5 seconds hold - Supine Straight Leg Raises  - 2-3 x daily - 7 x weekly - 2-3 sets - 10 reps - 5 seconds hold - Seated Knee Flexion Extension AROM   - 2-4 x daily - 7 x weekly - 2-3 sets - 10 reps - 5 seconds hold - Seated Hamstring Stretch with Strap  - 2-4 x daily - 7 x weekly - 1 sets - 3 reps - 20-30 seconds hold   ASSESSMENT:   CLINICAL IMPRESSION: Patient appears to understand ice massage & scar mobilizations.  Pt continues to be limited by pain, edema and weakness.  Her range improves with PT exercises and manual therapy.      OBJECTIVE IMPAIRMENTS: Abnormal gait, decreased activity tolerance, decreased balance, decreased endurance, decreased knowledge of use of DME, decreased mobility, difficulty walking, decreased ROM, decreased strength, increased edema, increased muscle spasms, impaired flexibility, postural dysfunction,  and pain.    ACTIVITY LIMITATIONS: carrying, lifting, bending, sitting, standing, squatting, sleeping, stairs, transfers, bed mobility, and locomotion level   PARTICIPATION LIMITATIONS: meal prep, cleaning, laundry, driving, community activity, occupation, and church   PERSONAL FACTORS: Profession and 1 comorbidity: see PMH  are also affecting patient's functional outcome.    REHAB POTENTIAL: Good   CLINICAL DECISION MAKING: Stable/uncomplicated   EVALUATION COMPLEXITY: Low     GOALS: Goals reviewed with patient? Yes   SHORT TERM GOALS: (target date for Short term goals 10/19/2022)    1.  Patient will demonstrate independent use of home exercise program to maintain progress from in clinic treatments.   Goal status: On-going 09/18/22   LONG TERM GOALS: (target dates for all long term goals are 10 weeks  11/23/2022 )   1. Patient will demonstrate/report pain at worst less than or equal to 2/10 to facilitate minimal limitation in daily activity secondary to pain symptoms.   Goal status: New   2. Patient will demonstrate independent use  of home exercise program to facilitate ability to maintain/progress functional gains from skilled physical therapy services.   Goal status: New   3. Patient will demonstrate FOTO outcome > or = 74 % to indicate reduced disability due to condition.   Goal status: New   4.  Patient will demonstrate right LE MMT 5/5 throughout to faciltiate usual transfers, stairs, squatting at [facility] for daily life.    Goal status: New   5.  Patient PROM right knee 0* - 110* Goal status: New   6.  Patient ambulates >500' negotiates ramps, curbs & stairs single rail without device independently. Goal status: New   7.  patient verbalizes understanding of ongoing HEP.  Goal Status: New     PLAN:   PT FREQUENCY: 2-3x/wk    PT DURATION: 10 weeks   PLANNED INTERVENTIONS: Therapeutic exercises, Therapeutic activity, Neuromuscular re-education, Balance training, Gait training, Patient/Family education, Self Care, Joint mobilization, Stair training, Vestibular training, DME instructions, Dry Needling, Electrical stimulation, Cryotherapy, Moist heat, scar mobilization, Taping, Vasopneumatic device, Ultrasound, Manual therapy, and Re-evaluation   PLAN FOR NEXT SESSION:   continue manual therapy & exercise for range, closed chain activities,  vaso to end prn    Jamey Reas, PT, DPT n1/12/2022, 12:35 PM

## 2022-10-06 ENCOUNTER — Other Ambulatory Visit: Payer: Self-pay | Admitting: Adult Health

## 2022-10-09 ENCOUNTER — Ambulatory Visit: Payer: Federal, State, Local not specified - PPO | Admitting: Physical Therapy

## 2022-10-09 ENCOUNTER — Encounter: Payer: Self-pay | Admitting: Physical Therapy

## 2022-10-09 DIAGNOSIS — M25561 Pain in right knee: Secondary | ICD-10-CM | POA: Diagnosis not present

## 2022-10-09 DIAGNOSIS — R600 Localized edema: Secondary | ICD-10-CM | POA: Diagnosis not present

## 2022-10-09 DIAGNOSIS — R2689 Other abnormalities of gait and mobility: Secondary | ICD-10-CM | POA: Diagnosis not present

## 2022-10-09 DIAGNOSIS — M25661 Stiffness of right knee, not elsewhere classified: Secondary | ICD-10-CM

## 2022-10-09 DIAGNOSIS — R2681 Unsteadiness on feet: Secondary | ICD-10-CM

## 2022-10-09 DIAGNOSIS — M6281 Muscle weakness (generalized): Secondary | ICD-10-CM

## 2022-10-09 NOTE — Therapy (Signed)
OUTPATIENT PHYSICAL THERAPY TREATMENT NOTE & PROGRESS NOTE   Patient Name: Shelby Espinoza MRN: [id number] DOB:1965-10-23, 57 y.o., female Today's Date: 10/09/2022  PCP:  Dorothyann Peng, NP   REFERRING PROVIDER:  Leandrew Koyanagi, MD   Progress Note Reporting Period 09/17/2022 to 10/09/2022  See note below for Objective Data and Assessment of Progress/Goals.      END OF SESSION:   PT End of Session - 10/09/22 0855     Visit Number 8    Number of Visits 22    Date for PT Re-Evaluation 11/23/22    Authorization Type [employer] Employee    Authorization Time Period $25 COPAY, DED MET, OOP Individual FEP BLUE FOCUS Remaining $5,727.45    Progress Note Due on Visit 18    PT Start Time [phone]    PT Stop Time 0940    PT Time Calculation (min) 48 min    Activity Tolerance Patient tolerated treatment well;Patient limited by pain;Patient limited by fatigue    Behavior During Therapy [facility] for tasks assessed/performed                  Past Medical History:  Diagnosis Date   Arthritis    GERD (gastroesophageal reflux disease)    Hemorrhoids    Hypothyroidism    Past Surgical History:  Procedure Laterality Date   APPENDECTOMY  1985   BREAST REDUCTION SURGERY Bilateral [place]   COLONOSCOPY  2022   TOTAL KNEE ARTHROPLASTY Right 08/29/2022   Procedure: RIGHT TOTAL KNEE ARTHROPLASTY;  Surgeon: Leandrew Koyanagi, MD;  Location: [place];  Service: Orthopedics;  Laterality: Right;   TUBAL LIGATION  1998   Patient Active Problem List   Diagnosis Date Noted   Status post total right knee replacement 08/29/2022   Primary osteoarthritis of right knee 07/25/2022   Viral URI 05/17/2018   Hypothyroidism 05/02/2007    REFERRING DIAG:  H41.740 (ICD-10-CM) - Status post total right knee replacement  08/29/22  THERAPY DIAG:  Acute pain of right knee  Stiffness of right knee, not elsewhere classified  Localized edema  Other abnormalities of gait and  mobility  Unsteadiness on feet  Muscle weakness (generalized)  Rationale for Evaluation and Treatment Rehabilitation  PERTINENT HISTORY: OA, breast reduction sg   PRECAUTIONS: none  SUBJECTIVE:                                                                                                                                                                                      SUBJECTIVE STATEMENT:   She has been forgetting the cane when walking in home with  no knee instability noted. The ice massage & scar mobs are going well.   PAIN:  Are you having pain? Yes Scale: today 2/10 & since last PT lowest 2/10 & highest 4/10 Location: Rt anterior knee Aggravating: standing, walking & swelling Alleviating: elevation, ice, meds   OBJECTIVE: (objective measures completed at initial evaluation unless otherwise dated)  DIAGNOSTIC FINDINGS: 08/29/2022 X-ray Total knee arthroplasty. Prosthetic components are well seated. Expected soft tissue changes in the anterior kne   PATIENT SURVEYS:  FOTO 55% and predicted 74%   EDEMA:  RLE: above knee 58.3 cm  around knee 51cm  below knee 45.1cm LLE:: above knee 53.8cm  around knee 45.1cm  below knee  39cm   POSTURE: flexed trunk  and weight shift left   PALPATION: Tenderness along incision, patella and joint line.  Denies tenderness over quad, hamstring and gastroc.    LOWER EXTREMITY ROM:    ROM Right eval Rt 09/18/22 Right 09/21/22 Right 09/28/22 Right 10/04/22 Right 10/09/22  Knee flexion Supine P: 68* Seated  A: 54* P: 65* Supine A: 58 P: 65  Supine AA: 76 Supine AA: 80 deg  Supine P: 83* A: 71* Seated P: 88*  Knee extension Seated A: LAQ -55* Quad set -9* P: -2* Supine A: -5 P: -2  A: -15 (seated LAQ)      (Blank rows = not tested)   LOWER EXTREMITY MMT:   MMT Right eval Left eval  Hip flexion      Hip extension      Hip abduction      Hip adduction      Hip internal rotation      Hip external rotation      Knee  flexion 2/5    Knee extension 2-/5 Minimal quad activitation    Ankle dorsiflexion      Ankle plantarflexion      Ankle inversion      Ankle eversion       (Blank rows = not tested)     FUNCTIONAL TESTS:  18 inch chair transfer: requires use of BUEs to push up. Lt SLS: unable to perform as lifting RLE was difficult Rt SLS: unable Supine <> sit: has to use LLE to manage RLE.    GAIT: Distance walked: 85' Assistive device utilized: Environmental consultant - 2 wheeled Level of assistance: SBA Comments: antalgic pattern with decreased RLE stance, knee flexed in stance, no knee flexion for swing, limited WB RLE due to pain & weakness.      TODAY'S TREATMENT [facility] Adult PT Treatment:                                                DATE:  10/09/2022: Therapeutic Exercise: Leg Press BLEs 93# 20 reps with 5 sec hold ext & flex;  RLE only 43# 10 reps 2 sets Hamstring stretch strap SLR with PT manual assist for knee ext 30 sec hold 2 reps. Step ups concentric RLE & down eccentric RLE 4" step rail & cane support 10 reps. PT demo & verbal cues on technique Squat at sink 10 reps PT demo & verbal cues on technique.  LAQ & active knee flex with contralateral LE opposing motion 15 reps.   Therapeutic Activities: PT demo & verbal cues on sit to/from stand technique.  Pt able to return demo using hands on seat of 18"  chair and without UE support from 24" bar stool.  PT educated on using 24" bar stool for modified stand for ADLs in kitchen & bathroom and work activities. Pt verbalized understanding.  PT recommended using modified stair pattern to neg flight of stairs at home to her office.  If she needs to carry laptop &/or snacks then use a backpack for now. Pt verbalized understanding.  Pt questioning use of heat.  PT recommended only on muscle areas not joint until edema decreases.  Neuromuscular Re-ed: Slider to 3 cones (ant-lat, lat & post-lat) with stance LE flex with outward motion & ext with inward motion. 5  reps ea LE. PT demo & verbal cues on technique.   Manual Therapy: PROM seated knee flexion with painful end feel; pt reports even with hold pain does not subside. Contract-relax to increase flexion.   Modalities: Vasopneumatic right knee 34 degrees x 10 minutes medium compression with elevation   10/04/2022: Therapeutic Exercise: SciFit Bike seat 7 rocking with BLEs/BUEs for flexion stretch 8 min Gastroc stretch on step heel depression 30 sec hold 2 reps Heel raises 10 reps Step ups 4" step RLE 10 reps with BUE support Leg Press BLEs 87# 10 reps 2 sets with 5 sec hold ext & flex Hamstring stretch strap SLR with PT manual assist for knee ext 30 sec hold 2 reps. Bridge BLE working feet closer to trunk in up position every other rep for knee flexion stretch 10 reps  Gait Training:  Pt amb with cane with PT demo & verbal cues on knee flexion in terminal stance/swing and ext in stance  Neuromuscular Re-ed: Tandem stance on foam 30 sec RLE in front & in back with intermittent UE touch  Self-Care: PT instructed in ice massage with demo & verbal cues. Pt verbalized & return demo understanding.  PT instructed in scar mobs with demo & verbal cues. Pt verbalized understanding.  Manual Therapy: PROM seated knee flexion with painful end feel; pt reports even with hold pain does not subside.     10/02/2022: Therapeutic Exercise: SciFit Bike seat 7 rocking with BLEs/BUEs for flexion stretch 8 min Gastroc stretch on incline board 30 sec hold 1 rep Heel raises on incline board 10 reps Leg Press BLEs 81# 10 reps 2 sets with 5 se hold ext & flex Hamstring stretch strap SLR with PT manual assist for knee ext 30 sec hold 2 reps. Forefoot on 2 inch step to facilitate greater ext with Red TheraBand TKE x12 reps with 5 sec hold cues for form and appropriate quad extension Supine heel slides x5 with strap and towel, 10sec hold   Manual Therapy: PROM seated knee flexion with painful end feel; pt  reports even with hold pain does not subside.   Soft tissue mobs to medial knee area which pt reports greatest pain  Therapeutic Activities Pt verbally educated on positioning in bed to "tent" sheets off feet and pillow bw LEs in sidelying. Pt verbalized understanding. PT verbally educated on getting in/out of car.  Begin to practice moving RLE gas to break slow, cautious initially and progress to rapid sudden stop. Pt verbalized understanding.  Modalities: Vasopneumatic right knee 34 degrees x 10 minutes medium compression    PATIENT EDUCATION:  Education details: rationale for interventions, HEP Person educated: Patient Education method: Consulting civil engineer, Demonstration, Verbal cues, and Handouts Education comprehension: verbalized understanding, returned demonstration, and verbal cues required   HOME EXERCISE PROGRAM: Access Code: [email or web address] URL: [email or web address] Date: 09/17/2022 Prepared by: Jamey Reas  Exercises - Ankle Alphabet in Elevation  - 2-4 x daily - 7 x weekly - 1 sets - 1 reps - Quad Setting and Stretching  - 2-4 x daily - 7 x weekly - 5-10 sets - 10 reps - prop 5-10 minutes & quad set5 seconds hold - Supine Heel Slide with Strap  - 2-3 x daily - 7 x weekly - 2-3 sets - 10 reps - 5 seconds hold - Supine Knee Extension Strengthening  - 2-3 x daily - 7 x weekly - 2-3 sets - 10 reps - 5 seconds hold - Supine Straight Leg Raises  - 2-3 x daily - 7 x weekly - 2-3 sets - 10 reps - 5 seconds hold - Seated Knee Flexion Extension AROM   - 2-4 x daily - 7 x weekly - 2-3 sets - 10 reps - 5 seconds hold - Seated Hamstring Stretch with Strap  - 2-4 x daily - 7 x weekly - 1 sets - 3 reps - 20-30 seconds hold   ASSESSMENT:   CLINICAL IMPRESSION: Patient is slowly improving with function & range.  Her range is limited by pain as she continues to have painful end feel to flexion.  Pt continues to benefit from skilled PT.   OBJECTIVE IMPAIRMENTS: Abnormal gait,  decreased activity tolerance, decreased balance, decreased endurance, decreased knowledge of use of DME, decreased mobility, difficulty walking, decreased ROM, decreased strength, increased edema, increased muscle spasms, impaired flexibility, postural dysfunction, and pain.    ACTIVITY LIMITATIONS: carrying, lifting, bending, sitting, standing, squatting, sleeping, stairs, transfers, bed mobility, and locomotion level   PARTICIPATION LIMITATIONS: meal prep, cleaning, laundry, driving, community activity, occupation, and church   PERSONAL FACTORS: Profession and 1 comorbidity: see PMH  are also affecting patient's functional outcome.    REHAB POTENTIAL: Good   CLINICAL DECISION MAKING: Stable/uncomplicated   EVALUATION COMPLEXITY: Low     GOALS: Goals reviewed with patient? Yes   SHORT TERM GOALS: (target date for Short term goals 10/19/2022)    1.  Patient will demonstrate independent use of home exercise program to maintain progress from in clinic treatments.   Goal status: On-going 09/18/22   LONG TERM GOALS: (target dates for all long term goals are 10 weeks  11/23/2022 )   1. Patient will demonstrate/report pain at worst less than or equal to 2/10 to facilitate minimal limitation in daily activity secondary to pain symptoms.   Goal status: New   2. Patient will demonstrate independent use of home exercise program to facilitate ability to maintain/progress functional gains from skilled physical therapy services.   Goal status: New   3. Patient will demonstrate FOTO outcome > or = 74 % to indicate reduced disability due to condition.   Goal status: New   4.  Patient will demonstrate right LE MMT 5/5 throughout to faciltiate usual transfers, stairs, squatting at [facility] for daily life.    Goal status: New   5.  Patient PROM right knee 0* - 110* Goal status: New   6.  Patient ambulates >500' negotiates ramps, curbs & stairs single rail without device independently. Goal  status: New   7.  patient verbalizes understanding of ongoing HEP.  Goal Status: New     PLAN:   PT FREQUENCY: 2-3x/wk    PT DURATION: 10 weeks   PLANNED INTERVENTIONS: Therapeutic exercises, Therapeutic activity, Neuromuscular re-education, Balance training, Gait training, Patient/Family education, Self Care, Joint mobilization, Stair training, Vestibular training, DME instructions, Dry Needling, Electrical stimulation, Cryotherapy, Moist heat,  scar mobilization, Taping, Vasopneumatic device, Ultrasound, Manual therapy, and Re-evaluation   PLAN FOR NEXT SESSION:   do interim FOTO,   continue manual therapy & exercise for range, closed chain activities,  vaso to end prn    Jamey Reas, PT, DPT n1/06/2023, 10:46 AM

## 2022-10-10 ENCOUNTER — Ambulatory Visit (INDEPENDENT_AMBULATORY_CARE_PROVIDER_SITE_OTHER): Payer: Federal, State, Local not specified - PPO | Admitting: Physician Assistant

## 2022-10-10 ENCOUNTER — Ambulatory Visit (INDEPENDENT_AMBULATORY_CARE_PROVIDER_SITE_OTHER): Payer: Federal, State, Local not specified - PPO

## 2022-10-10 ENCOUNTER — Telehealth: Payer: Self-pay | Admitting: Orthopaedic Surgery

## 2022-10-10 DIAGNOSIS — Z96651 Presence of right artificial knee joint: Secondary | ICD-10-CM | POA: Diagnosis not present

## 2022-10-10 NOTE — Telephone Encounter (Signed)
Patient states she had an appointment this morning and got a letter stating she could return to work but it has 2023 on it instead of 2024, she wants to know it the correction can be made and put in to my chart for her.please advise.Marland Kitchen

## 2022-10-10 NOTE — Telephone Encounter (Signed)
Spoke with patient. Created new note and sent to her MyChart account.

## 2022-10-10 NOTE — Progress Notes (Signed)
   Post-Op Visit Note   Patient: Shelby Espinoza           Date of Birth: 1966-07-21           MRN: [id number] Visit Date: 10/10/2022 PCP: Dorothyann Peng, NP   Assessment & Plan:  Chief Complaint:  Chief Complaint  Patient presents with   Right Knee - Routine Post Op   Visit Diagnoses:  1. Status post total right knee replacement     Plan: Patient is a pleasant 57 year old female who comes in today 6 weeks status post right total knee replacement 08/29/2022.  She has been doing well.  She has been in physical therapy ambulating unassisted.  She has been taking ibuprofen for pain but stopped taking the aspirin as the pharmacist told her she should not be taking this with ibuprofen.  Examination of her left knee reveals a well-healed surgical incision.  Range of motion 0 to 90 degrees.  Stable to valgus varus stress.  She is neurovascular intact distally.  At this point, she will continue with physical therapy.  Dental prophylaxis reinforced.  Follow-up in 6 weeks for recheck.  Call with concerns or questions.  Follow-Up Instructions: Return in about 6 weeks (around 11/21/2022).   Orders:  Orders Placed This Encounter  Procedures   XR Knee 1-2 Views Right   No orders of the defined types were placed in this encounter.   Imaging: XR Knee 1-2 Views Right  Result Date: 10/10/2022 Well-seated prosthesis without complication   PMFS History: Patient Active Problem List   Diagnosis Date Noted   Status post total right knee replacement 08/29/2022   Primary osteoarthritis of right knee 07/25/2022   Viral URI 05/17/2018   Hypothyroidism 05/02/2007   Past Medical History:  Diagnosis Date   Arthritis    GERD (gastroesophageal reflux disease)    Hemorrhoids    Hypothyroidism     Family History  Problem Relation Age of Onset   Alcohol abuse Father    Throat cancer Father    Heart disease Father    Hypertension Father    Breast cancer Mother    Breast cancer Maternal Aunt     Heart disease Paternal Aunt    Colon cancer Neg Hx     Past Surgical History:  Procedure Laterality Date   APPENDECTOMY  1985   BREAST REDUCTION SURGERY Bilateral [place]   COLONOSCOPY  2022   TOTAL KNEE ARTHROPLASTY Right 08/29/2022   Procedure: RIGHT TOTAL KNEE ARTHROPLASTY;  Surgeon: Leandrew Koyanagi, MD;  Location: [facility];  Service: Orthopedics;  Laterality: Right;   TUBAL LIGATION  1998   Social History   Occupational History   Not on file  Tobacco Use   Smoking status: Never   Smokeless tobacco: Never  Vaping Use   Vaping Use: Never used  Substance and Sexual Activity   Alcohol use: No   Drug use: No   Sexual activity: Yes    Birth control/protection: None

## 2022-10-11 ENCOUNTER — Telehealth: Payer: Self-pay | Admitting: Rehabilitative and Restorative Service Providers"

## 2022-10-11 ENCOUNTER — Encounter: Payer: Federal, State, Local not specified - PPO | Admitting: Rehabilitative and Restorative Service Providers"

## 2022-10-11 NOTE — Therapy (Incomplete)
OUTPATIENT PHYSICAL THERAPY TREATMENT NOTE & PROGRESS NOTE   Patient Name: Shelby Espinoza MRN: [id number] DOB:1966-07-05, 57 y.o., female Today's Date: 10/11/2022  PCP:  Dorothyann Peng, NP   REFERRING PROVIDER:  Leandrew Koyanagi, MD   Progress Note Reporting Period 09/17/2022 to 10/09/2022  See note below for Objective Data and Assessment of Progress/Goals.      END OF SESSION:          Past Medical History:  Diagnosis Date   Arthritis    GERD (gastroesophageal reflux disease)    Hemorrhoids    Hypothyroidism    Past Surgical History:  Procedure Laterality Date   APPENDECTOMY  1985   BREAST REDUCTION SURGERY Bilateral [place]   COLONOSCOPY  2022   TOTAL KNEE ARTHROPLASTY Right 08/29/2022   Procedure: RIGHT TOTAL KNEE ARTHROPLASTY;  Surgeon: Leandrew Koyanagi, MD;  Location: [facility];  Service: Orthopedics;  Laterality: Right;   TUBAL LIGATION  1998   Patient Active Problem List   Diagnosis Date Noted   Status post total right knee replacement 08/29/2022   Primary osteoarthritis of right knee 07/25/2022   Viral URI 05/17/2018   Hypothyroidism 05/02/2007    REFERRING DIAG:  T01.779 (ICD-10-CM) - Status post total right knee replacement  08/29/22  THERAPY DIAG:  No diagnosis found.  Rationale for Evaluation and Treatment Rehabilitation  PERTINENT HISTORY: OA, breast reduction sg   PRECAUTIONS: none  SUBJECTIVE:                                                                                                                                                                                      SUBJECTIVE STATEMENT:   She has been forgetting the cane when walking in home with no knee instability noted. The ice massage & scar mobs are going well.   PAIN:  Are you having pain? Yes Scale: today 2/10 & since last PT lowest 2/10 & highest 4/10 Location: Rt anterior knee Aggravating: standing, walking & swelling Alleviating: elevation,  ice, meds   OBJECTIVE: (objective measures completed at initial evaluation unless otherwise dated)  DIAGNOSTIC FINDINGS: 08/29/2022 X-ray Total knee arthroplasty. Prosthetic components are well seated. Expected soft tissue changes in the anterior kne   PATIENT SURVEYS:  FOTO 55% and predicted 74%   EDEMA:  RLE: above knee 58.3 cm  around knee 51cm  below knee 45.1cm LLE:: above knee 53.8cm  around knee 45.1cm  below knee  39cm   POSTURE: flexed trunk  and weight shift left   PALPATION: Tenderness along incision, patella and joint line.  Denies tenderness over quad, hamstring and gastroc.  LOWER EXTREMITY ROM:    ROM Right eval Rt 09/18/22 Right 09/21/22 Right 09/28/22 Right 10/04/22 Right 10/09/22  Knee flexion Supine P: 68* Seated  A: 54* P: 65* Supine A: 58 P: 65  Supine AA: 76 Supine AA: 80 deg  Supine P: 83* A: 71* Seated P: 88*  Knee extension Seated A: LAQ -55* Quad set -9* P: -2* Supine A: -5 P: -2  A: -15 (seated LAQ)      (Blank rows = not tested)   LOWER EXTREMITY MMT:   MMT Right eval Left eval  Hip flexion      Hip extension      Hip abduction      Hip adduction      Hip internal rotation      Hip external rotation      Knee flexion 2/5    Knee extension 2-/5 Minimal quad activitation    Ankle dorsiflexion      Ankle plantarflexion      Ankle inversion      Ankle eversion       (Blank rows = not tested)     FUNCTIONAL TESTS:  18 inch chair transfer: requires use of BUEs to push up. Lt SLS: unable to perform as lifting RLE was difficult Rt SLS: unable Supine <> sit: has to use LLE to manage RLE.    GAIT: Distance walked: 72' Assistive device utilized: Environmental consultant - 2 wheeled Level of assistance: SBA Comments: antalgic pattern with decreased RLE stance, knee flexed in stance, no knee flexion for swing, limited WB RLE due to pain & weakness.      TODAY'S TREATMENT                                                                         DATE: 10/11/2022: Therapeutic Exercise: Leg Press BLEs 93# 20 reps with 5 sec hold ext & flex;  RLE only 43# 10 reps 2 sets Hamstring stretch strap SLR with PT manual assist for knee ext 30 sec hold 2 reps. Step ups concentric RLE & down eccentric RLE 4" step rail & cane support 10 reps. PT demo & verbal cues on technique Squat at sink 10 reps PT demo & verbal cues on technique.  LAQ & active knee flex with contralateral LE opposing motion 15 reps.   Therapeutic Activities: PT demo & verbal cues on sit to/from stand technique.  Pt able to return demo using hands on seat of 18" chair and without UE support from 24" bar stool.  PT educated on using 24" bar stool for modified stand for ADLs in kitchen & bathroom and work activities. Pt verbalized understanding.  PT recommended using modified stair pattern to neg flight of stairs at home to her office.  If she needs to carry laptop &/or snacks then use a backpack for now. Pt verbalized understanding.  Pt questioning use of heat.  PT recommended only on muscle areas not joint until edema decreases.  Neuromuscular Re-ed: Slider to 3 cones (ant-lat, lat & post-lat) with stance LE flex with outward motion & ext with inward motion. 5 reps ea LE. PT demo & verbal cues on technique.   Manual Therapy: PROM seated knee flexion with painful end feel; pt  reports even with hold pain does not subside. Contract-relax to increase flexion.   Modalities: Vasopneumatic right knee 34 degrees x 10 minutes medium compression with elevation  TODAY'S TREATMENT                                                                        DATE: 10/09/2022: Therapeutic Exercise: Leg Press BLEs 93# 20 reps with 5 sec hold ext & flex;  RLE only 43# 10 reps 2 sets Hamstring stretch strap SLR with PT manual assist for knee ext 30 sec hold 2 reps. Step ups concentric RLE & down eccentric RLE 4" step rail & cane support 10 reps. PT demo & verbal cues on technique Squat at sink 10  reps PT demo & verbal cues on technique.  LAQ & active knee flex with contralateral LE opposing motion 15 reps.   Therapeutic Activities: PT demo & verbal cues on sit to/from stand technique.  Pt able to return demo using hands on seat of 18" chair and without UE support from 24" bar stool.  PT educated on using 24" bar stool for modified stand for ADLs in kitchen & bathroom and work activities. Pt verbalized understanding.  PT recommended using modified stair pattern to neg flight of stairs at home to her office.  If she needs to carry laptop &/or snacks then use a backpack for now. Pt verbalized understanding.  Pt questioning use of heat.  PT recommended only on muscle areas not joint until edema decreases.  Neuromuscular Re-ed: Slider to 3 cones (ant-lat, lat & post-lat) with stance LE flex with outward motion & ext with inward motion. 5 reps ea LE. PT demo & verbal cues on technique.   Manual Therapy: PROM seated knee flexion with painful end feel; pt reports even with hold pain does not subside. Contract-relax to increase flexion.   Modalities: Vasopneumatic right knee 34 degrees x 10 minutes medium compression with elevation   TODAY'S TREATMENT                                                                        DATE: 10/04/2022: Therapeutic Exercise: SciFit Bike seat 7 rocking with BLEs/BUEs for flexion stretch 8 min Gastroc stretch on step heel depression 30 sec hold 2 reps Heel raises 10 reps Step ups 4" step RLE 10 reps with BUE support Leg Press BLEs 87# 10 reps 2 sets with 5 sec hold ext & flex Hamstring stretch strap SLR with PT manual assist for knee ext 30 sec hold 2 reps. Bridge BLE working feet closer to trunk in up position every other rep for knee flexion stretch 10 reps  Gait Training:  Pt amb with cane with PT demo & verbal cues on knee flexion in terminal stance/swing and ext in stance  Neuromuscular Re-ed: Tandem stance on foam 30 sec RLE in front & in back  with intermittent UE touch  Self-Care: PT instructed in ice massage with demo & verbal cues. Pt verbalized &  return demo understanding.  PT instructed in scar mobs with demo & verbal cues. Pt verbalized understanding.  Manual Therapy: PROM seated knee flexion with painful end feel; pt reports even with hold pain does not subside.     TODAY'S TREATMENT                                                                        DATE:10/02/2022: Therapeutic Exercise: SciFit Bike seat 7 rocking with BLEs/BUEs for flexion stretch 8 min Gastroc stretch on incline board 30 sec hold 1 rep Heel raises on incline board 10 reps Leg Press BLEs 81# 10 reps 2 sets with 5 se hold ext & flex Hamstring stretch strap SLR with PT manual assist for knee ext 30 sec hold 2 reps. Forefoot on 2 inch step to facilitate greater ext with Red TheraBand TKE x12 reps with 5 sec hold cues for form and appropriate quad extension Supine heel slides x5 with strap and towel, 10sec hold   Manual Therapy: PROM seated knee flexion with painful end feel; pt reports even with hold pain does not subside.   Soft tissue mobs to medial knee area which pt reports greatest pain  Therapeutic Activities Pt verbally educated on positioning in bed to "tent" sheets off feet and pillow bw LEs in sidelying. Pt verbalized understanding. PT verbally educated on getting in/out of car.  Begin to practice moving RLE gas to break slow, cautious initially and progress to rapid sudden stop. Pt verbalized understanding.  Modalities: Vasopneumatic right knee 34 degrees x 10 minutes medium compression    PATIENT EDUCATION:  Education details: rationale for interventions, HEP Person educated: Patient Education method: Consulting civil engineer, Demonstration, Verbal cues, and Handouts Education comprehension: verbalized understanding, returned demonstration, and verbal cues required   HOME EXERCISE PROGRAM: Access Code: [email or web address] URL:  [email or web address] Date: 09/17/2022 Prepared by: Jamey Reas   Exercises - Ankle Alphabet in Elevation  - 2-4 x daily - 7 x weekly - 1 sets - 1 reps - Quad Setting and Stretching  - 2-4 x daily - 7 x weekly - 5-10 sets - 10 reps - prop 5-10 minutes & quad set5 seconds hold - Supine Heel Slide with Strap  - 2-3 x daily - 7 x weekly - 2-3 sets - 10 reps - 5 seconds hold - Supine Knee Extension Strengthening  - 2-3 x daily - 7 x weekly - 2-3 sets - 10 reps - 5 seconds hold - Supine Straight Leg Raises  - 2-3 x daily - 7 x weekly - 2-3 sets - 10 reps - 5 seconds hold - Seated Knee Flexion Extension AROM   - 2-4 x daily - 7 x weekly - 2-3 sets - 10 reps - 5 seconds hold - Seated Hamstring Stretch with Strap  - 2-4 x daily - 7 x weekly - 1 sets - 3 reps - 20-30 seconds hold   ASSESSMENT:   CLINICAL IMPRESSION: Patient is slowly improving with function & range.  Her range is limited by pain as she continues to have painful end feel to flexion.  Pt continues to benefit from skilled PT.   OBJECTIVE IMPAIRMENTS: Abnormal gait, decreased activity tolerance, decreased balance, decreased endurance, decreased knowledge of use of DME, decreased mobility,  difficulty walking, decreased ROM, decreased strength, increased edema, increased muscle spasms, impaired flexibility, postural dysfunction, and pain.    ACTIVITY LIMITATIONS: carrying, lifting, bending, sitting, standing, squatting, sleeping, stairs, transfers, bed mobility, and locomotion level   PARTICIPATION LIMITATIONS: meal prep, cleaning, laundry, driving, community activity, occupation, and church   PERSONAL FACTORS: Profession and 1 comorbidity: see PMH  are also affecting patient's functional outcome.    REHAB POTENTIAL: Good   CLINICAL DECISION MAKING: Stable/uncomplicated   EVALUATION COMPLEXITY: Low     GOALS: Goals reviewed with patient? Yes   SHORT TERM GOALS: (target date for Short term goals 10/19/2022)    1.   Patient will demonstrate independent use of home exercise program to maintain progress from in clinic treatments.   Goal status: On-going 09/18/22   LONG TERM GOALS: (target dates for all long term goals are 10 weeks  11/23/2022 )   1. Patient will demonstrate/report pain at worst less than or equal to 2/10 to facilitate minimal limitation in daily activity secondary to pain symptoms.   Goal status: New   2. Patient will demonstrate independent use of home exercise program to facilitate ability to maintain/progress functional gains from skilled physical therapy services.   Goal status: New   3. Patient will demonstrate FOTO outcome > or = 74 % to indicate reduced disability due to condition.   Goal status: New   4.  Patient will demonstrate right LE MMT 5/5 throughout to faciltiate usual transfers, stairs, squatting at [facility] for daily life.    Goal status: New   5.  Patient PROM right knee 0* - 110* Goal status: New   6.  Patient ambulates >500' negotiates ramps, curbs & stairs single rail without device independently. Goal status: New   7.  patient verbalizes understanding of ongoing HEP.  Goal Status: New     PLAN:   PT FREQUENCY: 2-3x/wk    PT DURATION: 10 weeks   PLANNED INTERVENTIONS: Therapeutic exercises, Therapeutic activity, Neuromuscular re-education, Balance training, Gait training, Patient/Family education, Self Care, Joint mobilization, Stair training, Vestibular training, DME instructions, Dry Needling, Electrical stimulation, Cryotherapy, Moist heat, scar mobilization, Taping, Vasopneumatic device, Ultrasound, Manual therapy, and Re-evaluation   PLAN FOR NEXT SESSION:   do interim FOTO,   continue manual therapy & exercise for range, closed chain activities,  vaso to end prn    Scot Jun, PT, DPT, OCS, ATC 10/11/22  7:42 AM

## 2022-10-11 NOTE — Telephone Encounter (Signed)
Called patient after 20 mins no show for appointment today.  Left message and reminder of next appointment time, as well as call back number.   Scot Jun, PT, DPT, OCS, ATC 10/11/22  9:05 AM

## 2022-10-15 ENCOUNTER — Ambulatory Visit (INDEPENDENT_AMBULATORY_CARE_PROVIDER_SITE_OTHER): Payer: Federal, State, Local not specified - PPO | Admitting: Rehabilitative and Restorative Service Providers"

## 2022-10-15 ENCOUNTER — Encounter: Payer: Self-pay | Admitting: Rehabilitative and Restorative Service Providers"

## 2022-10-15 DIAGNOSIS — M6281 Muscle weakness (generalized): Secondary | ICD-10-CM

## 2022-10-15 DIAGNOSIS — R2689 Other abnormalities of gait and mobility: Secondary | ICD-10-CM

## 2022-10-15 DIAGNOSIS — R600 Localized edema: Secondary | ICD-10-CM

## 2022-10-15 DIAGNOSIS — M25661 Stiffness of right knee, not elsewhere classified: Secondary | ICD-10-CM

## 2022-10-15 DIAGNOSIS — M25561 Pain in right knee: Secondary | ICD-10-CM | POA: Diagnosis not present

## 2022-10-15 DIAGNOSIS — R2681 Unsteadiness on feet: Secondary | ICD-10-CM

## 2022-10-15 NOTE — Therapy (Signed)
OUTPATIENT PHYSICAL THERAPY TREATMENT NOTE   Patient Name: Shelby Espinoza MRN: [id number] DOB:1965/10/07, 57 y.o., female Today's Date: 10/15/2022  PCP:  Dorothyann Peng, NP   REFERRING PROVIDER:  Leandrew Koyanagi, MD     END OF SESSION:   PT End of Session - 10/15/22 0804     Visit Number 9    Number of Visits 22    Date for PT Re-Evaluation 11/23/22    Authorization Type [employer]    Authorization Time Period $25 COPAY, DED MET, OOP Individual FEP BLUE FOCUS Remaining $[phone]    Progress Note Due on Visit 18    PT Start Time 0800    PT Stop Time 0850    PT Time Calculation (min) 50 min    Activity Tolerance Patient limited by pain    Behavior During Therapy [facility] for tasks assessed/performed                   Past Medical History:  Diagnosis Date   Arthritis    GERD (gastroesophageal reflux disease)    Hemorrhoids    Hypothyroidism    Past Surgical History:  Procedure Laterality Date   [place] Bilateral [place]  2022   TOTAL KNEE ARTHROPLASTY Right 08/29/2022   Procedure: RIGHT TOTAL KNEE ARTHROPLASTY;  Surgeon: Leandrew Koyanagi, MD;  Location: [facility];  Service: Orthopedics;  Laterality: Right;   TUBAL LIGATION  1998   Patient Active Problem List   Diagnosis Date Noted   Status post total right knee replacement 08/29/2022   Primary osteoarthritis of right knee 07/25/2022   Viral URI 05/17/2018   Hypothyroidism 05/02/2007    REFERRING DIAG:  H67.591 (ICD-10-CM) - Status post total right knee replacement  08/29/22  THERAPY DIAG:  Acute pain of right knee  Stiffness of right knee, not elsewhere classified  Localized edema  Other abnormalities of gait and mobility  Unsteadiness on feet  Muscle weakness (generalized)  Rationale for Evaluation and Treatment Rehabilitation  PERTINENT HISTORY: OA, breast reduction sg   PRECAUTIONS: none  SUBJECTIVE:                                                                                                                                                                                       SUBJECTIVE STATEMENT:   Pt indicated she has been trying to live normal but does have increased swelling and pain c increased activity.   PAIN:  Are you having pain? Yes Scale: at worst 4/10 in last 24 hours Location: Rt anterior knee  Aggravating: standing, walking & swelling Alleviating: elevation, ice, meds   OBJECTIVE: (objective measures completed at initial evaluation unless otherwise dated)  DIAGNOSTIC FINDINGS: 08/29/2022 X-ray Total knee arthroplasty. Prosthetic components are well seated. Expected soft tissue changes in the anterior kne   PATIENT SURVEYS:  10/15/2022 FOTO update:  61  Eval: FOTO 55% and predicted 74%   EDEMA:  Eval RLE: above knee 58.3 cm  around knee 51cm  below knee 45.1cm LLE:: above knee 53.8cm  around knee 45.1cm  below knee  39cm   POSTURE:  Eval flexed trunk  and weight shift left   PALPATION: Eval Tenderness along incision, patella and joint line.  Denies tenderness over quad, hamstring and gastroc.    LOWER EXTREMITY ROM:    ROM Right eval Rt 09/18/22 Right 09/21/22 Right 09/28/22 Right 10/04/22 Right 10/09/22  Knee flexion Supine P: 68* Seated  A: 54* P: 65* Supine A: 58 P: 65  Supine AA: 76 Supine AA: 80 deg  Supine P: 83* A: 71* Seated P: 88*  Knee extension Seated A: LAQ -55* Quad set -9* P: -2* Supine A: -5 P: -2  A: -15 (seated LAQ)      (Blank rows = not tested)   LOWER EXTREMITY MMT:   MMT Right eval Left eval  Hip flexion      Hip extension      Hip abduction      Hip adduction      Hip internal rotation      Hip external rotation      Knee flexion 2/5    Knee extension 2-/5 Minimal quad activitation    Ankle dorsiflexion      Ankle plantarflexion      Ankle inversion      Ankle eversion       (Blank rows = not tested)      FUNCTIONAL TESTS:  Eval 18 inch chair transfer: requires use of BUEs to push up. Lt SLS: unable to perform as lifting RLE was difficult Rt SLS: unable Supine <> sit: has to use LLE to manage RLE.    GAIT: 10/15/2022:  Independent  Eval: Distance walked: 40' Assistive device utilized: Environmental consultant - 2 wheeled Level of assistance: SBA Comments: antalgic pattern with decreased RLE stance, knee flexed in stance, no knee flexion for swing, limited WB RLE due to pain & weakness.      TODAY'S TREATMENT                                                                        DATE: 10/15/2022: Therapeutic Exercise: Seated alternating isometrics Rt knee extension/flexion 5 sec hold x 10 Seated LAQ sitting c end range pause each direction x 10 (cues for use throughout day) Supine LAQ c hip in 90 deg flexion c belt holding leg x 10 (cues for use at home for edema)  Leg Press BLEs 93# x 15  RLE only 50# 2 x 10 with 5 sec hold into flexion Step up fwd 4 inch c blue band TKE hold in stance on step Rt leg 2 x 10 with single hand on rail anteriorly  Supine quad set Rt 5 sec hold x 10   Manual Therapy: Seated Rt knee flexion mobilization with  movement with distraction/IR.  Contract/relax techniques for flexion mobility gains.    Modalities: Vasopneumatic right knee 34 degrees x 10 minutes medium compression with elevation  TODAY'S TREATMENT                                                                        DATE: 10/11/2022: Therapeutic Exercise: Leg Press BLEs 93# 20 reps with 5 sec hold ext & flex;  RLE only 43# 10 reps 2 sets Hamstring stretch strap SLR with PT manual assist for knee ext 30 sec hold 2 reps. Step ups concentric RLE & down eccentric RLE 4" step rail & cane support 10 reps. PT demo & verbal cues on technique Squat at sink 10 reps PT demo & verbal cues on technique.  LAQ & active knee flex with contralateral LE opposing motion 15 reps.   Therapeutic Activities: PT demo & verbal  cues on sit to/from stand technique.  Pt able to return demo using hands on seat of 18" chair and without UE support from 24" bar stool.  PT educated on using 24" bar stool for modified stand for ADLs in kitchen & bathroom and work activities. Pt verbalized understanding.  PT recommended using modified stair pattern to neg flight of stairs at home to her office.  If she needs to carry laptop &/or snacks then use a backpack for now. Pt verbalized understanding.  Pt questioning use of heat.  PT recommended only on muscle areas not joint until edema decreases.  Neuromuscular Re-ed: Slider to 3 cones (ant-lat, lat & post-lat) with stance LE flex with outward motion & ext with inward motion. 5 reps ea LE. PT demo & verbal cues on technique.   Manual Therapy: PROM seated knee flexion with painful end feel; pt reports even with hold pain does not subside. Contract-relax to increase flexion.   Modalities: Vasopneumatic right knee 34 degrees x 10 minutes medium compression with elevation  TODAY'S TREATMENT                                                                        DATE: 10/09/2022: Therapeutic Exercise: Leg Press BLEs 93# 20 reps with 5 sec hold ext & flex;  RLE only 43# 10 reps 2 sets Hamstring stretch strap SLR with PT manual assist for knee ext 30 sec hold 2 reps. Step ups concentric RLE & down eccentric RLE 4" step rail & cane support 10 reps. PT demo & verbal cues on technique Squat at sink 10 reps PT demo & verbal cues on technique.  LAQ & active knee flex with contralateral LE opposing motion 15 reps.   Therapeutic Activities: PT demo & verbal cues on sit to/from stand technique.  Pt able to return demo using hands on seat of 18" chair and without UE support from 24" bar stool.  PT educated on using 24" bar stool for modified stand for ADLs in kitchen & bathroom and work activities. Pt verbalized understanding.  PT recommended using  modified stair pattern to neg flight of stairs at  home to her office.  If she needs to carry laptop &/or snacks then use a backpack for now. Pt verbalized understanding.  Pt questioning use of heat.  PT recommended only on muscle areas not joint until edema decreases.  Neuromuscular Re-ed: Slider to 3 cones (ant-lat, lat & post-lat) with stance LE flex with outward motion & ext with inward motion. 5 reps ea LE. PT demo & verbal cues on technique.   Manual Therapy: PROM seated knee flexion with painful end feel; pt reports even with hold pain does not subside. Contract-relax to increase flexion.   Modalities: Vasopneumatic right knee 34 degrees x 10 minutes medium compression with elevation     PATIENT EDUCATION:  Education details: rationale for interventions, HEP Person educated: Patient Education method: Consulting civil engineer, Demonstration, Verbal cues, and Handouts Education comprehension: verbalized understanding, returned demonstration, and verbal cues required   HOME EXERCISE PROGRAM: Access Code: [email or web address] URL: [email or web address] Date: 09/17/2022 Prepared by: Jamey Reas   Exercises - Ankle Alphabet in Elevation  - 2-4 x daily - 7 x weekly - 1 sets - 1 reps - Quad Setting and Stretching  - 2-4 x daily - 7 x weekly - 5-10 sets - 10 reps - prop 5-10 minutes & quad set5 seconds hold - Supine Heel Slide with Strap  - 2-3 x daily - 7 x weekly - 2-3 sets - 10 reps - 5 seconds hold - Supine Knee Extension Strengthening  - 2-3 x daily - 7 x weekly - 2-3 sets - 10 reps - 5 seconds hold - Supine Straight Leg Raises  - 2-3 x daily - 7 x weekly - 2-3 sets - 10 reps - 5 seconds hold - Seated Knee Flexion Extension AROM   - 2-4 x daily - 7 x weekly - 2-3 sets - 10 reps - 5 seconds hold - Seated Hamstring Stretch with Strap  - 2-4 x daily - 7 x weekly - 1 sets - 3 reps - 20-30 seconds hold   ASSESSMENT:   CLINICAL IMPRESSION: Limited range in flexion and extension AROM (extension LAQ sitting lacking) that impacts her gait in  ambulation and other progressive mobility.  Improving mobility and strength will help improve her daily activity tolerance.   OBJECTIVE IMPAIRMENTS: Abnormal gait, decreased activity tolerance, decreased balance, decreased endurance, decreased knowledge of use of DME, decreased mobility, difficulty walking, decreased ROM, decreased strength, increased edema, increased muscle spasms, impaired flexibility, postural dysfunction, and pain.    ACTIVITY LIMITATIONS: carrying, lifting, bending, sitting, standing, squatting, sleeping, stairs, transfers, bed mobility, and locomotion level   PARTICIPATION LIMITATIONS: meal prep, cleaning, laundry, driving, community activity, occupation, and church   PERSONAL FACTORS: Profession and 1 comorbidity: see PMH  are also affecting patient's functional outcome.    REHAB POTENTIAL: Good   CLINICAL DECISION MAKING: Stable/uncomplicated   EVALUATION COMPLEXITY: Low     GOALS: Goals reviewed with patient? Yes   SHORT TERM GOALS: (target date for Short term goals 10/19/2022)    1.  Patient will demonstrate independent use of home exercise program to maintain progress from in clinic treatments.   Goal status: Met 10/15/2022   LONG TERM GOALS: (target dates for all long term goals are 10 weeks  11/23/2022 )   1. Patient will demonstrate/report pain at worst less than or equal to 2/10 to facilitate minimal limitation in daily activity secondary to pain symptoms.   Goal status: on going 10/15/2022   2. Patient  will demonstrate independent use of home exercise program to facilitate ability to maintain/progress functional gains from skilled physical therapy services.   Goal status: on going 10/15/2022   3. Patient will demonstrate FOTO outcome > or = 74 % to indicate reduced disability due to condition.   Goal status: on going 10/15/2022   4.  Patient will demonstrate right LE MMT 5/5 throughout to faciltiate usual transfers, stairs, squatting at [facility] for  daily life.    Goal status: on going 10/15/2022   5.  Patient PROM right knee 0* - 110* Goal status: on going 10/15/2022   6.  Patient ambulates >500' negotiates ramps, curbs & stairs single rail without device independently. Goal status: on going 10/15/2022   7.  patient verbalizes understanding of ongoing HEP.  Goal Status: on going 10/15/2022     PLAN:   PT FREQUENCY: 2-3x/wk    PT DURATION: 10 weeks   PLANNED INTERVENTIONS: Therapeutic exercises, Therapeutic activity, Neuromuscular re-education, Balance training, Gait training, Patient/Family education, Self Care, Joint mobilization, Stair training, Vestibular training, DME instructions, Dry Needling, Electrical stimulation, Cryotherapy, Moist heat, scar mobilization, Taping, Vasopneumatic device, Ultrasound, Manual therapy, and Re-evaluation   PLAN FOR NEXT SESSION:  Progressive manual for mobility improvements for Rt knee motion.  Strengthening in [place], [facility].    Scot Jun, PT, DPT, OCS, ATC 10/15/22  8:41 AM

## 2022-10-17 ENCOUNTER — Encounter: Payer: Self-pay | Admitting: Rehabilitative and Restorative Service Providers"

## 2022-10-17 ENCOUNTER — Ambulatory Visit (INDEPENDENT_AMBULATORY_CARE_PROVIDER_SITE_OTHER): Payer: Federal, State, Local not specified - PPO | Admitting: Rehabilitative and Restorative Service Providers"

## 2022-10-17 DIAGNOSIS — M25661 Stiffness of right knee, not elsewhere classified: Secondary | ICD-10-CM

## 2022-10-17 DIAGNOSIS — M6281 Muscle weakness (generalized): Secondary | ICD-10-CM

## 2022-10-17 DIAGNOSIS — R2681 Unsteadiness on feet: Secondary | ICD-10-CM

## 2022-10-17 DIAGNOSIS — R600 Localized edema: Secondary | ICD-10-CM | POA: Diagnosis not present

## 2022-10-17 DIAGNOSIS — R2689 Other abnormalities of gait and mobility: Secondary | ICD-10-CM | POA: Diagnosis not present

## 2022-10-17 DIAGNOSIS — M25561 Pain in right knee: Secondary | ICD-10-CM

## 2022-10-17 NOTE — Therapy (Signed)
OUTPATIENT PHYSICAL THERAPY TREATMENT NOTE   Patient Name: Shelby Espinoza MRN: [id number] DOB:1966/07/22, 57 y.o., female Today's Date: 10/17/2022  PCP:  Dorothyann Peng, NP   REFERRING PROVIDER:  Leandrew Koyanagi, MD     END OF SESSION:   PT End of Session - 10/17/22 0807     Visit Number 10    Number of Visits 22    Date for PT Re-Evaluation 11/23/22    Authorization Type [employer] Employee    Authorization Time Period $25 COPAY, DED MET, OOP Individual FEP BLUE FOCUS Remaining $[phone]    Progress Note Due on Visit 18    PT Start Time 0803    PT Stop Time 0853    PT Time Calculation (min) 50 min    Activity Tolerance Patient tolerated treatment well    Behavior During Therapy [facility] for tasks assessed/performed                    Past Medical History:  Diagnosis Date   Arthritis    GERD (gastroesophageal reflux disease)    Hemorrhoids    Hypothyroidism    Past Surgical History:  Procedure Laterality Date   APPENDECTOMY  1985   BREAST REDUCTION SURGERY Bilateral [place]   COLONOSCOPY  2022   TOTAL KNEE ARTHROPLASTY Right 08/29/2022   Procedure: RIGHT TOTAL KNEE ARTHROPLASTY;  Surgeon: Leandrew Koyanagi, MD;  Location: [place];  Service: Orthopedics;  Laterality: Right;   TUBAL LIGATION  1998   Patient Active Problem List   Diagnosis Date Noted   Status post total right knee replacement 08/29/2022   Primary osteoarthritis of right knee 07/25/2022   Viral URI 05/17/2018   Hypothyroidism 05/02/2007    REFERRING DIAG:  U76.546 (ICD-10-CM) - Status post total right knee replacement  08/29/22  THERAPY DIAG:  Acute pain of right knee  Stiffness of right knee, not elsewhere classified  Localized edema  Other abnormalities of gait and mobility  Unsteadiness on feet  Muscle weakness (generalized)  Rationale for Evaluation and Treatment Rehabilitation  PERTINENT HISTORY: OA, breast reduction sg   PRECAUTIONS:  none  SUBJECTIVE:                                                                                                                                                                                      SUBJECTIVE STATEMENT:   She indicated stiffness this morning.  Pt indicated taking ibuprofen.    PAIN:   Scale:tightness not pain Location: Rt anterior knee Aggravating: standing, walking & swelling Alleviating: elevation, ice, meds   OBJECTIVE: (objective measures completed at  initial evaluation unless otherwise dated)  DIAGNOSTIC FINDINGS: 08/29/2022 X-ray Total knee arthroplasty. Prosthetic components are well seated. Expected soft tissue changes in the anterior kne   PATIENT SURVEYS:  10/15/2022 FOTO update:  61  Eval: FOTO 55% and predicted 74%   EDEMA:  Eval RLE: above knee 58.3 cm  around knee 51cm  below knee 45.1cm LLE:: above knee 53.8cm  around knee 45.1cm  below knee  39cm   POSTURE:  Eval flexed trunk  and weight shift left   PALPATION: Eval Tenderness along incision, patella and joint line.  Denies tenderness over quad, hamstring and gastroc.    LOWER EXTREMITY ROM:    ROM Right eval Rt 09/18/22 Right 09/21/22 Right 09/28/22 Right 10/04/22 Right 10/09/22 Right 10/17/2022  Knee flexion Supine P: 68* Seated  A: 54* P: 65* Supine A: 58 P: 65  Supine AA: 76 Supine AA: 80 deg  Supine P: 83* A: 71* Seated P: 88* Supine heel slide AROM: 91  Knee extension Seated A: LAQ -55* Quad set -9* P: -2* Supine A: -5 P: -2  A: -15 (seated LAQ)    Seated LAQ AROM -12   (Blank rows = not tested)   LOWER EXTREMITY MMT:   MMT Right eval   Hip flexion      Hip extension      Hip abduction      Hip adduction      Hip internal rotation      Hip external rotation      Knee flexion 2/5    Knee extension 2-/5 Minimal quad activitation    Ankle dorsiflexion      Ankle plantarflexion      Ankle inversion      Ankle eversion       (Blank rows = not tested)      FUNCTIONAL TESTS:  Eval 18 inch chair transfer: requires use of BUEs to push up. Lt SLS: unable to perform as lifting RLE was difficult Rt SLS: unable Supine <> sit: has to use LLE to manage RLE.    GAIT: 10/15/2022:  Independent  Eval: Distance walked: 31' Assistive device utilized: Environmental consultant - 2 wheeled Level of assistance: SBA Comments: antalgic pattern with decreased RLE stance, knee flexed in stance, no knee flexion for swing, limited WB RLE due to pain & weakness.      TODAY'S TREATMENT                                                                        DATE: 10/17/2022: Therapeutic Exercise: Nustep Lvl 5 UE/LE for ROM 8 mins  Seated alternating isometrics Rt knee extension/flexion 5 sec hold x 12 Seated LAQ 3 lbs c pause in end ranges 2 x 10 Supine heel slide AROM 5 sec hold x 10   Leg Press BLEs 93# x 20  RLE only 50# 2 x 10 with 5 sec hold into flexion Lateral step down 4 inch step WB on Rt leg 2 x 10 c single hand assist light touch Incline gastroc stretch 30 sec x 3 bilateral   Manual Therapy: Seated Rt knee flexion mobilization with movement with distraction/IR.  Contract/relax techniques for flexion mobility gains.    Modalities: Vasopneumatic right knee 34 degrees x  10 minutes medium compression with elevation  TODAY'S TREATMENT                                                                        DATE: 10/15/2022: Therapeutic Exercise: Seated alternating isometrics Rt knee extension/flexion 5 sec hold x 10 Seated LAQ sitting c end range pause each direction x 10 (cues for use throughout day) Supine LAQ c hip in 90 deg flexion c belt holding leg x 10 (cues for use at home for edema)  Leg Press BLEs 93# x 15  RLE only 50# 2 x 10 with 5 sec hold into flexion Step up fwd 4 inch c blue band TKE hold in stance on step Rt leg 2 x 10 with single hand on rail anteriorly  Supine quad set Rt 5 sec hold x 10   Manual Therapy: Seated Rt knee flexion mobilization with  movement with distraction/IR.  Contract/relax techniques for flexion mobility gains.    Modalities: Vasopneumatic right knee 34 degrees x 10 minutes medium compression with elevation  TODAY'S TREATMENT                                                                        DATE: 10/11/2022: Therapeutic Exercise: Leg Press BLEs 93# 20 reps with 5 sec hold ext & flex;  RLE only 43# 10 reps 2 sets Hamstring stretch strap SLR with PT manual assist for knee ext 30 sec hold 2 reps. Step ups concentric RLE & down eccentric RLE 4" step rail & cane support 10 reps. PT demo & verbal cues on technique Squat at sink 10 reps PT demo & verbal cues on technique.  LAQ & active knee flex with contralateral LE opposing motion 15 reps.   Therapeutic Activities: PT demo & verbal cues on sit to/from stand technique.  Pt able to return demo using hands on seat of 18" chair and without UE support from 24" bar stool.  PT educated on using 24" bar stool for modified stand for ADLs in kitchen & bathroom and work activities. Pt verbalized understanding.  PT recommended using modified stair pattern to neg flight of stairs at home to her office.  If she needs to carry laptop &/or snacks then use a backpack for now. Pt verbalized understanding.  Pt questioning use of heat.  PT recommended only on muscle areas not joint until edema decreases.  Neuromuscular Re-ed: Slider to 3 cones (ant-lat, lat & post-lat) with stance LE flex with outward motion & ext with inward motion. 5 reps ea LE. PT demo & verbal cues on technique.   Manual Therapy: PROM seated knee flexion with painful end feel; pt reports even with hold pain does not subside. Contract-relax to increase flexion.   Modalities: Vasopneumatic right knee 34 degrees x 10 minutes medium compression with elevation    PATIENT EDUCATION:  Education details: rationale for interventions, HEP Person educated: Patient Education method: Explanation, Demonstration, Verbal  cues, and Handouts Education comprehension: verbalized understanding,  returned demonstration, and verbal cues required   HOME EXERCISE PROGRAM: Access Code: [email or web address] URL: [email or web address] Date: 09/17/2022 Prepared by: Jamey Reas   Exercises - Ankle Alphabet in Elevation  - 2-4 x daily - 7 x weekly - 1 sets - 1 reps - Quad Setting and Stretching  - 2-4 x daily - 7 x weekly - 5-10 sets - 10 reps - prop 5-10 minutes & quad set5 seconds hold - Supine Heel Slide with Strap  - 2-3 x daily - 7 x weekly - 2-3 sets - 10 reps - 5 seconds hold - Supine Knee Extension Strengthening  - 2-3 x daily - 7 x weekly - 2-3 sets - 10 reps - 5 seconds hold - Supine Straight Leg Raises  - 2-3 x daily - 7 x weekly - 2-3 sets - 10 reps - 5 seconds hold - Seated Knee Flexion Extension AROM   - 2-4 x daily - 7 x weekly - 2-3 sets - 10 reps - 5 seconds hold - Seated Hamstring Stretch with Strap  - 2-4 x daily - 7 x weekly - 1 sets - 3 reps - 20-30 seconds hold   ASSESSMENT:   CLINICAL IMPRESSION: Recheck of active motion today showed continued steady overall improvement compared to previous movement pattern measurements.  Pt to continue to benefit from manual and interventions prompting more flexion and extension mobility c strengthening to improve functional tasks.   OBJECTIVE IMPAIRMENTS: Abnormal gait, decreased activity tolerance, decreased balance, decreased endurance, decreased knowledge of use of DME, decreased mobility, difficulty walking, decreased ROM, decreased strength, increased edema, increased muscle spasms, impaired flexibility, postural dysfunction, and pain.    ACTIVITY LIMITATIONS: carrying, lifting, bending, sitting, standing, squatting, sleeping, stairs, transfers, bed mobility, and locomotion level   PARTICIPATION LIMITATIONS: meal prep, cleaning, laundry, driving, community activity, occupation, and church   PERSONAL FACTORS: Profession and 1 comorbidity: see PMH  are  also affecting patient's functional outcome.    REHAB POTENTIAL: Good   CLINICAL DECISION MAKING: Stable/uncomplicated   EVALUATION COMPLEXITY: Low     GOALS: Goals reviewed with patient? Yes   SHORT TERM GOALS: (target date for Short term goals 10/19/2022)    1.  Patient will demonstrate independent use of home exercise program to maintain progress from in clinic treatments.   Goal status: Met 10/15/2022   LONG TERM GOALS: (target dates for all long term goals are 10 weeks  11/23/2022 )   1. Patient will demonstrate/report pain at worst less than or equal to 2/10 to facilitate minimal limitation in daily activity secondary to pain symptoms.   Goal status: on going 10/15/2022   2. Patient will demonstrate independent use of home exercise program to facilitate ability to maintain/progress functional gains from skilled physical therapy services.   Goal status: on going 10/15/2022   3. Patient will demonstrate FOTO outcome > or = 74 % to indicate reduced disability due to condition.   Goal status: on going 10/15/2022   4.  Patient will demonstrate right LE MMT 5/5 throughout to faciltiate usual transfers, stairs, squatting at [facility] for daily life.    Goal status: on going 10/15/2022   5.  Patient PROM right knee 0* - 110* Goal status: on going 10/15/2022   6.  Patient ambulates >500' negotiates ramps, curbs & stairs single rail without device independently. Goal status: on going 10/15/2022   7.  patient verbalizes understanding of ongoing HEP.  Goal Status: on going 10/15/2022     PLAN:   PT  FREQUENCY: 2-3x/wk    PT DURATION: 10 weeks   PLANNED INTERVENTIONS: Therapeutic exercises, Therapeutic activity, Neuromuscular re-education, Balance training, Gait training, Patient/Family education, Self Care, Joint mobilization, Stair training, Vestibular training, DME instructions, Dry Needling, Electrical stimulation, Cryotherapy, Moist heat, scar mobilization, Taping, Vasopneumatic  device, Ultrasound, Manual therapy, and Re-evaluation   PLAN FOR NEXT SESSION:  Progressive manual for mobility improvements for Rt knee motion.  Strengthening for quad.    Scot Jun, PT, DPT, OCS, ATC 10/17/22  8:46 AM

## 2022-10-22 ENCOUNTER — Encounter: Payer: Self-pay | Admitting: Rehabilitative and Restorative Service Providers"

## 2022-10-22 ENCOUNTER — Ambulatory Visit (INDEPENDENT_AMBULATORY_CARE_PROVIDER_SITE_OTHER): Payer: Federal, State, Local not specified - PPO | Admitting: Rehabilitative and Restorative Service Providers"

## 2022-10-22 DIAGNOSIS — M25661 Stiffness of right knee, not elsewhere classified: Secondary | ICD-10-CM | POA: Diagnosis not present

## 2022-10-22 DIAGNOSIS — M25561 Pain in right knee: Secondary | ICD-10-CM

## 2022-10-22 DIAGNOSIS — R2681 Unsteadiness on feet: Secondary | ICD-10-CM

## 2022-10-22 DIAGNOSIS — M6281 Muscle weakness (generalized): Secondary | ICD-10-CM

## 2022-10-22 DIAGNOSIS — R2689 Other abnormalities of gait and mobility: Secondary | ICD-10-CM

## 2022-10-22 DIAGNOSIS — R600 Localized edema: Secondary | ICD-10-CM

## 2022-10-22 NOTE — Therapy (Signed)
OUTPATIENT PHYSICAL THERAPY TREATMENT NOTE   Patient Name: Shelby Espinoza MRN: [id number] DOB:[birth year], 57 y.o., female Today's Date: 10/22/2022  PCP:  Dorothyann Peng, NP   REFERRING PROVIDER:  Leandrew Koyanagi, MD     END OF SESSION:   PT End of Session - 10/22/22 0815     Visit Number 11    Number of Visits 22    Date for PT Re-Evaluation 11/23/22    Authorization Type [employer] Employee    Authorization Time Period $25 COPAY, DED MET, OOP Individual FEP BLUE FOCUS Remaining $[phone]    Progress Note Due on Visit 18    PT Start Time 0804    PT Stop Time 0854    PT Time Calculation (min) 50 min    Activity Tolerance Patient tolerated treatment well    Behavior During Therapy WFL for tasks assessed/performed                     Past Medical History:  Diagnosis Date   Arthritis    GERD (gastroesophageal reflux disease)    Hemorrhoids    Hypothyroidism    Past Surgical History:  Procedure Laterality Date   APPENDECTOMY  1985   BREAST REDUCTION SURGERY Bilateral [place]   COLONOSCOPY  2022   TOTAL KNEE ARTHROPLASTY Right 08/29/2022   Procedure: RIGHT TOTAL KNEE ARTHROPLASTY;  Surgeon: Leandrew Koyanagi, MD;  Location: [place];  Service: Orthopedics;  Laterality: Right;   TUBAL LIGATION  1998   Patient Active Problem List   Diagnosis Date Noted   Status post total right knee replacement 08/29/2022   Primary osteoarthritis of right knee 07/25/2022   Viral URI 05/17/2018   Hypothyroidism 05/02/2007    REFERRING DIAG:  B71.696 (ICD-10-CM) - Status post total right knee replacement  08/29/22  THERAPY DIAG:  Acute pain of right knee  Stiffness of right knee, not elsewhere classified  Localized edema  Other abnormalities of gait and mobility  Unsteadiness on feet  Muscle weakness (generalized)  Rationale for Evaluation and Treatment Rehabilitation  PERTINENT HISTORY: OA, breast reduction sg   PRECAUTIONS:  none  SUBJECTIVE:                                                                                                                                                                                      SUBJECTIVE STATEMENT:   She indicated cold and stiff this morning.  She indicated several days of feeling more pain/soreness on medial knee at end of last week.  Reported having swelling with increased activity.   PAIN:   Scale:tightness not  pain Location: Rt anterior knee Aggravating: standing, walking & swelling Alleviating: elevation, ice, meds   OBJECTIVE: (objective measures completed at initial evaluation unless otherwise dated)  DIAGNOSTIC FINDINGS: 08/29/2022 X-ray Total knee arthroplasty. Prosthetic components are well seated. Expected soft tissue changes in the anterior kne   PATIENT SURVEYS:  10/15/2022 FOTO update:  61  Eval: FOTO 55% and predicted 74%   EDEMA:  Eval RLE: above knee 58.3 cm  around knee 51cm  below knee 45.1cm LLE:: above knee 53.8cm  around knee 45.1cm  below knee  39cm   POSTURE:  Eval flexed trunk  and weight shift left   PALPATION: Eval Tenderness along incision, patella and joint line.  Denies tenderness over quad, hamstring and gastroc.    LOWER EXTREMITY ROM:    ROM Right eval Rt 09/18/22 Right 09/21/22 Right 09/28/22 Right 10/04/22 Right 10/09/22 Right 10/17/2022  Knee flexion Supine P: 68* Seated  A: 54* P: 65* Supine A: 58 P: 65  Supine AA: 76 Supine AA: 80 deg  Supine P: 83* A: 71* Seated P: 88* Supine heel slide AROM: 91  Knee extension Seated A: LAQ -55* Quad set -9* P: -2* Supine A: -5 P: -2  A: -15 (seated LAQ)    Seated LAQ AROM -12   (Blank rows = not tested)   LOWER EXTREMITY MMT:   MMT Right eval   Hip flexion      Hip extension      Hip abduction      Hip adduction      Hip internal rotation      Hip external rotation      Knee flexion 2/5    Knee extension 2-/5 Minimal quad activitation    Ankle  dorsiflexion      Ankle plantarflexion      Ankle inversion      Ankle eversion       (Blank rows = not tested)     FUNCTIONAL TESTS:  Eval 18 inch chair transfer: requires use of BUEs to push up. Lt SLS: unable to perform as lifting RLE was difficult Rt SLS: unable Supine <> sit: has to use LLE to manage RLE.    GAIT: 10/15/2022:  Independent  Eval: Distance walked: 79' Assistive device utilized: Environmental consultant - 2 wheeled Level of assistance: SBA Comments: antalgic pattern with decreased RLE stance, knee flexed in stance, no knee flexion for swing, limited WB RLE due to pain & weakness.      TODAY'S TREATMENT                                                                        DATE: 10/22/2022: Therapeutic Exercise: Nustep Lvl 5 UE/LE for ROM 8 mins  Seated alternating isometrics Rt knee extension/flexion 5 sec hold x 12 Seated LAQ c pause in end ranges x15 Leg Press BLEs 93# x 20  RLE only 50# 2 x 10 with 5 sec hold into flexion Seated quad set 5 sec hold x 10 Seated SLR x x2 Incline gastroc stretch 30 sec x 3 bilateral   Manual Therapy: Seated Rt knee flexion mobilization with movement with distraction/IR.  Contract/relax techniques for flexion mobility gains.    Modalities: Vasopneumatic right knee 34 degrees x 10  minutes medium compression with elevation  TODAY'S TREATMENT                                                                        DATE: 10/17/2022: Therapeutic Exercise: Nustep Lvl 5 UE/LE for ROM 8 mins  Seated alternating isometrics Rt knee extension/flexion 5 sec hold x 12 Seated LAQ 3 lbs c pause in end ranges 2 x 10 Supine heel slide AROM 5 sec hold x 10   Leg Press BLEs 93# x 20  RLE only 50# 2 x 10 with 5 sec hold into flexion Lateral step down 4 inch step WB on Rt leg 2 x 10 c single hand assist light touch Incline gastroc stretch 30 sec x 3 bilateral   Manual Therapy: Seated Rt knee flexion mobilization with movement with distraction/IR.   Contract/relax techniques for flexion mobility gains.    Modalities: Vasopneumatic right knee 34 degrees x 10 minutes medium compression with elevation  TODAY'S TREATMENT                                                                        DATE: 10/15/2022: Therapeutic Exercise: Seated alternating isometrics Rt knee extension/flexion 5 sec hold x 10 Seated LAQ sitting c end range pause each direction x 10 (cues for use throughout day) Supine LAQ c hip in 90 deg flexion c belt holding leg x 10 (cues for use at home for edema)  Leg Press BLEs 93# x 15  RLE only 50# 2 x 10 with 5 sec hold into flexion Step up fwd 4 inch c blue band TKE hold in stance on step Rt leg 2 x 10 with single hand on rail anteriorly  Supine quad set Rt 5 sec hold x 10   Manual Therapy: Seated Rt knee flexion mobilization with movement with distraction/IR.  Contract/relax techniques for flexion mobility gains.    Modalities: Vasopneumatic right knee 34 degrees x 10 minutes medium compression with elevation    PATIENT EDUCATION:  Education details: rationale for interventions, HEP Person educated: Patient Education method: Consulting civil engineer, Demonstration, Verbal cues, and Handouts Education comprehension: verbalized understanding, returned demonstration, and verbal cues required   HOME EXERCISE PROGRAM: Access Code: [email or web address] URL: [email or web address] Date: 09/17/2022 Prepared by: Jamey Reas   Exercises - Ankle Alphabet in Elevation  - 2-4 x daily - 7 x weekly - 1 sets - 1 reps - Quad Setting and Stretching  - 2-4 x daily - 7 x weekly - 5-10 sets - 10 reps - prop 5-10 minutes & quad set5 seconds hold - Supine Heel Slide with Strap  - 2-3 x daily - 7 x weekly - 2-3 sets - 10 reps - 5 seconds hold - Supine Knee Extension Strengthening  - 2-3 x daily - 7 x weekly - 2-3 sets - 10 reps - 5 seconds hold - Supine Straight Leg Raises  - 2-3 x daily - 7 x weekly - 2-3 sets - 10 reps - 5 seconds hold -  Seated Knee Flexion Extension AROM   - 2-4 x daily - 7 x weekly - 2-3 sets - 10 reps - 5 seconds hold - Seated Hamstring Stretch with Strap  - 2-4 x daily - 7 x weekly - 1 sets - 3 reps - 20-30 seconds hold   ASSESSMENT:   CLINICAL IMPRESSION: Due to complaints of end of week soreness/symptom increase, did not push WB strengthening intervention today.  Pt does continue to lack active and passive full knee extension.  Reviewed key HEP to help promote improved TKE gains including quad set.  Unable to perform SLR without increased pain and extension lag.   OBJECTIVE IMPAIRMENTS: Abnormal gait, decreased activity tolerance, decreased balance, decreased endurance, decreased knowledge of use of DME, decreased mobility, difficulty walking, decreased ROM, decreased strength, increased edema, increased muscle spasms, impaired flexibility, postural dysfunction, and pain.    ACTIVITY LIMITATIONS: carrying, lifting, bending, sitting, standing, squatting, sleeping, stairs, transfers, bed mobility, and locomotion level   PARTICIPATION LIMITATIONS: meal prep, cleaning, laundry, driving, community activity, occupation, and church   PERSONAL FACTORS: Profession and 1 comorbidity: see PMH  are also affecting patient's functional outcome.    REHAB POTENTIAL: Good   CLINICAL DECISION MAKING: Stable/uncomplicated   EVALUATION COMPLEXITY: Low     GOALS: Goals reviewed with patient? Yes   SHORT TERM GOALS: (target date for Short term goals 10/19/2022)    1.  Patient will demonstrate independent use of home exercise program to maintain progress from in clinic treatments.   Goal status: Met 10/15/2022   LONG TERM GOALS: (target dates for all long term goals are 10 weeks  11/23/2022 )   1. Patient will demonstrate/report pain at worst less than or equal to 2/10 to facilitate minimal limitation in daily activity secondary to pain symptoms.   Goal status: on going 10/15/2022   2. Patient will demonstrate  independent use of home exercise program to facilitate ability to maintain/progress functional gains from skilled physical therapy services.   Goal status: on going 10/15/2022   3. Patient will demonstrate FOTO outcome > or = 74 % to indicate reduced disability due to condition.   Goal status: on going 10/15/2022   4.  Patient will demonstrate right LE MMT 5/5 throughout to faciltiate usual transfers, stairs, squatting at [facility] for daily life.    Goal status: on going 10/15/2022   5.  Patient PROM right knee 0* - 110* Goal status: on going 10/15/2022   6.  Patient ambulates >500' negotiates ramps, curbs & stairs single rail without device independently. Goal status: on going 10/15/2022   7.  patient verbalizes understanding of ongoing HEP.  Goal Status: on going 10/15/2022     PLAN:   PT FREQUENCY: 2-3x/wk    PT DURATION: 10 weeks   PLANNED INTERVENTIONS: Therapeutic exercises, Therapeutic activity, Neuromuscular re-education, Balance training, Gait training, Patient/Family education, Self Care, Joint mobilization, Stair training, Vestibular training, DME instructions, Dry Needling, Electrical stimulation, Cryotherapy, Moist heat, scar mobilization, Taping, Vasopneumatic device, Ultrasound, Manual therapy, and Re-evaluation   PLAN FOR NEXT SESSION:  Progressive manual for mobility improvements for Rt knee motion.  Strengthening for quad and TKE gains.   Scot Jun, PT, DPT, OCS, ATC 10/22/22  8:45 AM

## 2022-10-24 ENCOUNTER — Encounter: Payer: Federal, State, Local not specified - PPO | Admitting: Rehabilitative and Restorative Service Providers"

## 2022-10-24 NOTE — Therapy (Deleted)
OUTPATIENT PHYSICAL THERAPY TREATMENT NOTE   Patient Name: Shelby Espinoza MRN: [id number] DOB:1966-02-18, 57 y.o., female Today's Date: 10/24/2022  PCP:  Dorothyann Peng, NP   REFERRING PROVIDER:  Leandrew Koyanagi, MD     END OF SESSION:             Past Medical History:  Diagnosis Date   Arthritis    GERD (gastroesophageal reflux disease)    Hemorrhoids    Hypothyroidism    Past Surgical History:  Procedure Laterality Date   [place]ilateral [place]   COLONOSCOPY  2022   TOTAL KNEE ARTHROPLASTY Right 08/29/2022   Procedure: RIGHT TOTAL KNEE ARTHROPLASTY;  Surgeon: Leandrew Koyanagi, MD;  Location: [facility];  Service: Orthopedics;  Laterality: Right;   TUBAL LIGATION  1998   Patient Active Problem List   Diagnosis Date Noted   Status post total right knee replacement 08/29/2022   Primary osteoarthritis of right knee 07/25/2022   Viral URI 05/17/2018   Hypothyroidism 05/02/2007    REFERRING DIAG:  [id number] (ICD-10-CM) - Status post total right knee replacement  08/29/22  THERAPY DIAG:  No diagnosis found.  Rationale for Evaluation and Treatment Rehabilitation  PERTINENT HISTORY: OA, breast reduction sg   PRECAUTIONS: none  SUBJECTIVE:                                                                                                                                                                                      SUBJECTIVE STATEMENT:   She indicated cold and stiff this morning.  She indicated several days of feeling more pain/soreness on medial knee at end of last week.  Reported having swelling with increased activity.   PAIN:   Scale:tightness not pain Location: Rt anterior knee Aggravating: standing, walking & swelling Alleviating: elevation, ice, meds   OBJECTIVE: (objective measures completed at initial evaluation unless otherwise dated)  DIAGNOSTIC FINDINGS: 08/29/2022 X-ray  Total knee arthroplasty. Prosthetic components are well seated. Expected soft tissue changes in the anterior kne   PATIENT SURVEYS:  10/15/2022 FOTO update:  61  Eval: FOTO 55% and predicted 74%   EDEMA:  Eval RLE: above knee 58.3 cm  around knee 51cm  below knee 45.1cm LLE:: above knee 53.8cm  around knee 45.1cm  below knee  39cm   POSTURE:  Eval flexed trunk  and weight shift left   PALPATION: Eval Tenderness along incision, patella and joint line.  Denies tenderness over quad, hamstring and gastroc.    LOWER EXTREMITY ROM:    ROM Right eval Rt 09/18/22 Right 09/21/22 Right  09/28/22 Right 10/04/22 Right 10/09/22 Right 10/17/2022  Knee flexion Supine P: 68* Seated  A: 54* P: 65* Supine A: 58 P: 65  Supine AA: 76 Supine AA: 80 deg  Supine P: 83* A: 71* Seated P: 88* Supine heel slide AROM: 91  Knee extension Seated A: LAQ -55* Quad set -9* P: -2* Supine A: -5 P: -2  A: -15 (seated LAQ)    Seated LAQ AROM -12   (Blank rows = not tested)   LOWER EXTREMITY MMT:   MMT Right eval   Hip flexion      Hip extension      Hip abduction      Hip adduction      Hip internal rotation      Hip external rotation      Knee flexion 2/5    Knee extension 2-/5 Minimal quad activitation    Ankle dorsiflexion      Ankle plantarflexion      Ankle inversion      Ankle eversion       (Blank rows = not tested)     FUNCTIONAL TESTS:  Eval 18 inch chair transfer: requires use of BUEs to push up. Lt SLS: unable to perform as lifting RLE was difficult Rt SLS: unable Supine <> sit: has to use LLE to manage RLE.    GAIT: 10/15/2022:  Independent  Eval: Distance walked: 17' Assistive device utilized: Environmental consultant - 2 wheeled Level of assistance: SBA Comments: antalgic pattern with decreased RLE stance, knee flexed in stance, no knee flexion for swing, limited WB RLE due to pain & weakness.      TODAY'S TREATMENT                                                                         DATE: 10/22/2022: Therapeutic Exercise: Nustep Lvl 5 UE/LE for ROM 8 mins  Seated alternating isometrics Rt knee extension/flexion 5 sec hold x 12 Seated LAQ c pause in end ranges x15 Leg Press BLEs 93# x 20  RLE only 50# 2 x 10 with 5 sec hold into flexion Seated quad set 5 sec hold x 10 Seated SLR x x2 Incline gastroc stretch 30 sec x 3 bilateral   Manual Therapy: Seated Rt knee flexion mobilization with movement with distraction/IR.  Contract/relax techniques for flexion mobility gains.    Modalities: Vasopneumatic right knee 34 degrees x 10 minutes medium compression with elevation  TODAY'S TREATMENT                                                                        DATE: 10/17/2022: Therapeutic Exercise: Nustep Lvl 5 UE/LE for ROM 8 mins  Seated alternating isometrics Rt knee extension/flexion 5 sec hold x 12 Seated LAQ 3 lbs c pause in end ranges 2 x 10 Supine heel slide AROM 5 sec hold x 10   Leg Press BLEs 93# x 20  RLE only 50# 2 x 10 with 5 sec hold  into flexion Lateral step down 4 inch step WB on Rt leg 2 x 10 c single hand assist light touch Incline gastroc stretch 30 sec x 3 bilateral   Manual Therapy: Seated Rt knee flexion mobilization with movement with distraction/IR.  Contract/relax techniques for flexion mobility gains.    Modalities: Vasopneumatic right knee 34 degrees x 10 minutes medium compression with elevation  TODAY'S TREATMENT                                                                        DATE: 10/15/2022: Therapeutic Exercise: Seated alternating isometrics Rt knee extension/flexion 5 sec hold x 10 Seated LAQ sitting c end range pause each direction x 10 (cues for use throughout day) Supine LAQ c hip in 90 deg flexion c belt holding leg x 10 (cues for use at home for edema)  Leg Press BLEs 93# x 15  RLE only 50# 2 x 10 with 5 sec hold into flexion Step up fwd 4 inch c blue band TKE hold in stance on step Rt leg 2 x 10 with single  hand on rail anteriorly  Supine quad set Rt 5 sec hold x 10   Manual Therapy: Seated Rt knee flexion mobilization with movement with distraction/IR.  Contract/relax techniques for flexion mobility gains.    Modalities: Vasopneumatic right knee 34 degrees x 10 minutes medium compression with elevation    PATIENT EDUCATION:  Education details: rationale for interventions, HEP Person educated: Patient Education method: Consulting civil engineer, Demonstration, Verbal cues, and Handouts Education comprehension: verbalized understanding, returned demonstration, and verbal cues required   HOME EXERCISE PROGRAM: Access Code: [email or web address] URL: [email or web address] Date: 09/17/2022 Prepared by: Jamey Reas   Exercises - Ankle Alphabet in Elevation  - 2-4 x daily - 7 x weekly - 1 sets - 1 reps - Quad Setting and Stretching  - 2-4 x daily - 7 x weekly - 5-10 sets - 10 reps - prop 5-10 minutes & quad set5 seconds hold - Supine Heel Slide with Strap  - 2-3 x daily - 7 x weekly - 2-3 sets - 10 reps - 5 seconds hold - Supine Knee Extension Strengthening  - 2-3 x daily - 7 x weekly - 2-3 sets - 10 reps - 5 seconds hold - Supine Straight Leg Raises  - 2-3 x daily - 7 x weekly - 2-3 sets - 10 reps - 5 seconds hold - Seated Knee Flexion Extension AROM   - 2-4 x daily - 7 x weekly - 2-3 sets - 10 reps - 5 seconds hold - Seated Hamstring Stretch with Strap  - 2-4 x daily - 7 x weekly - 1 sets - 3 reps - 20-30 seconds hold   ASSESSMENT:   CLINICAL IMPRESSION: Due to complaints of end of week soreness/symptom increase, did not push WB strengthening intervention today.  Pt does continue to lack active and passive full knee extension.  Reviewed key HEP to help promote improved TKE gains including quad set.  Unable to perform SLR without increased pain and extension lag.   OBJECTIVE IMPAIRMENTS: Abnormal gait, decreased activity tolerance, decreased balance, decreased endurance, decreased knowledge of use  of DME, decreased mobility, difficulty walking, decreased ROM, decreased strength, increased edema, increased muscle spasms,  impaired flexibility, postural dysfunction, and pain.    ACTIVITY LIMITATIONS: carrying, lifting, bending, sitting, standing, squatting, sleeping, stairs, transfers, bed mobility, and locomotion level   PARTICIPATION LIMITATIONS: meal prep, cleaning, laundry, driving, community activity, occupation, and church   PERSONAL FACTORS: Profession and 1 comorbidity: see PMH  are also affecting patient's functional outcome.    REHAB POTENTIAL: Good   CLINICAL DECISION MAKING: Stable/uncomplicated   EVALUATION COMPLEXITY: Low     GOALS: Goals reviewed with patient? Yes   SHORT TERM GOALS: (target date for Short term goals 10/19/2022)    1.  Patient will demonstrate independent use of home exercise program to maintain progress from in clinic treatments.   Goal status: Met 10/15/2022   LONG TERM GOALS: (target dates for all long term goals are 10 weeks  11/23/2022 )   1. Patient will demonstrate/report pain at worst less than or equal to 2/10 to facilitate minimal limitation in daily activity secondary to pain symptoms.   Goal status: on going 10/15/2022   2. Patient will demonstrate independent use of home exercise program to facilitate ability to maintain/progress functional gains from skilled physical therapy services.   Goal status: on going 10/15/2022   3. Patient will demonstrate FOTO outcome > or = 74 % to indicate reduced disability due to condition.   Goal status: on going 10/15/2022   4.  Patient will demonstrate right LE MMT 5/5 throughout to faciltiate usual transfers, stairs, squatting at [facility] for daily life.    Goal status: on going 10/15/2022   5.  Patient PROM right knee 0* - 110* Goal status: on going 10/15/2022   6.  Patient ambulates >500' negotiates ramps, curbs & stairs single rail without device independently. Goal status: on going 10/15/2022    7.  patient verbalizes understanding of ongoing HEP.  Goal Status: on going 10/15/2022     PLAN:   PT FREQUENCY: 2-3x/wk    PT DURATION: 10 weeks   PLANNED INTERVENTIONS: Therapeutic exercises, Therapeutic activity, Neuromuscular re-education, Balance training, Gait training, Patient/Family education, Self Care, Joint mobilization, Stair training, Vestibular training, DME instructions, Dry Needling, Electrical stimulation, Cryotherapy, Moist heat, scar mobilization, Taping, Vasopneumatic device, Ultrasound, Manual therapy, and Re-evaluation   PLAN FOR NEXT SESSION:  Progressive manual for mobility improvements for Rt knee motion.  Strengthening for quad and TKE gains.   Scot Jun, PT, DPT, OCS, ATC 10/24/22  7:49 AM

## 2022-10-29 ENCOUNTER — Encounter: Payer: Self-pay | Admitting: Rehabilitative and Restorative Service Providers"

## 2022-10-29 ENCOUNTER — Ambulatory Visit: Payer: Federal, State, Local not specified - PPO | Admitting: Rehabilitative and Restorative Service Providers"

## 2022-10-29 DIAGNOSIS — R2681 Unsteadiness on feet: Secondary | ICD-10-CM

## 2022-10-29 DIAGNOSIS — M25561 Pain in right knee: Secondary | ICD-10-CM

## 2022-10-29 DIAGNOSIS — M6281 Muscle weakness (generalized): Secondary | ICD-10-CM

## 2022-10-29 DIAGNOSIS — R600 Localized edema: Secondary | ICD-10-CM | POA: Diagnosis not present

## 2022-10-29 DIAGNOSIS — M25661 Stiffness of right knee, not elsewhere classified: Secondary | ICD-10-CM | POA: Diagnosis not present

## 2022-10-29 DIAGNOSIS — R2689 Other abnormalities of gait and mobility: Secondary | ICD-10-CM | POA: Diagnosis not present

## 2022-10-29 NOTE — Therapy (Signed)
OUTPATIENT PHYSICAL THERAPY TREATMENT NOTE   Patient Name: Shelby Espinoza MRN: [id number] DOB:[birth year], 57 y.o., female Today's Date: 10/29/2022  PCP:  Dorothyann Peng, NP   REFERRING PROVIDER:  Leandrew Koyanagi, MD     END OF SESSION:   PT End of Session - 10/29/22 0814     Visit Number 12    Number of Visits 22    Date for PT Re-Evaluation 11/23/22    Authorization Type [employer]    Authorization Time Period $25 COPAY, DED MET, OOP Individual FEP BLUE FOCUS Remaining $[phone]    Progress Note Due on Visit 18    PT Start Time 0806    PT Stop Time 0901    PT Time Calculation (min) 55 min    Activity Tolerance Patient limited by pain    Behavior During Therapy [facility] for tasks assessed/performed                      Past Medical History:  Diagnosis Date   Arthritis    GERD (gastroesophageal reflux disease)    Hemorrhoids    Hypothyroidism    Past Surgical History:  Procedure Laterality Date   APPENDECTOMY  1985   BREAST REDUCTION SURGERY Bilateral [place]  2022   TOTAL KNEE ARTHROPLASTY Right 08/29/2022   Procedure: RIGHT TOTAL KNEE ARTHROPLASTY;  Surgeon: Leandrew Koyanagi, MD;  Location: [place];  Service: Orthopedics;  Laterality: Right;   TUBAL LIGATION  1998   Patient Active Problem List   Diagnosis Date Noted   Status post total right knee replacement 08/29/2022   Primary osteoarthritis of right knee 07/25/2022   Viral URI 05/17/2018   Hypothyroidism 05/02/2007    REFERRING DIAG:  I33.825 (ICD-10-CM) - Status post total right knee replacement  08/29/22  THERAPY DIAG:  Acute pain of right knee  Stiffness of right knee, not elsewhere classified  Localized edema  Other abnormalities of gait and mobility  Unsteadiness on feet  Muscle weakness (generalized)  Rationale for Evaluation and Treatment Rehabilitation  PERTINENT HISTORY: OA, breast reduction sg   PRECAUTIONS:  none  SUBJECTIVE:                                                                                                                                                                                      SUBJECTIVE STATEMENT:   She indicated having increased swelling and tightness/stiffness with travel out of state.  She indicated she felt like she took steps back in how her knee felt over the weekend.   PAIN:   Scale: 6/10  Location: Rt anterior knee Aggravating: standing, walking & swelling Alleviating: elevation, ice, meds   OBJECTIVE: (objective measures completed at initial evaluation unless otherwise dated)  DIAGNOSTIC FINDINGS: 08/29/2022 X-ray Total knee arthroplasty. Prosthetic components are well seated. Expected soft tissue changes in the anterior kne   PATIENT SURVEYS:  10/15/2022 FOTO update:  61  Eval: FOTO 55% and predicted 74%   EDEMA:  Eval RLE: above knee 58.3 cm  around knee 51cm  below knee 45.1cm LLE:: above knee 53.8cm  around knee 45.1cm  below knee  39cm   POSTURE:  Eval flexed trunk  and weight shift left   PALPATION: Eval Tenderness along incision, patella and joint line.  Denies tenderness over quad, hamstring and gastroc.    LOWER EXTREMITY ROM:    ROM Right eval Rt 09/18/22 Right 09/21/22 Right 09/28/22 Right 10/04/22 Right 10/09/22 Right 10/17/2022  Knee flexion Supine P: 68* Seated  A: 54* P: 65* Supine A: 58 P: 65  Supine AA: 76 Supine AA: 80 deg  Supine P: 83* A: 71* Seated P: 88* Supine heel slide AROM: 91  Knee extension Seated A: LAQ -55* Quad set -9* P: -2* Supine A: -5 P: -2  A: -15 (seated LAQ)    Seated LAQ AROM -12   (Blank rows = not tested)   LOWER EXTREMITY MMT:   MMT Right eval Right 10/29/2022  Hip flexion      Hip extension      Hip abduction      Hip adduction      Hip internal rotation      Hip external rotation      Knee flexion 2/5    Knee extension 2-/5 Minimal quad activitation 2+/5 (unable to  perform full range against gravity   Ankle dorsiflexion      Ankle plantarflexion      Ankle inversion      Ankle eversion       (Blank rows = not tested)     FUNCTIONAL TESTS:  Eval 18 inch chair transfer: requires use of BUEs to push up. Lt SLS: unable to perform as lifting RLE was difficult Rt SLS: unable Supine <> sit: has to use LLE to manage RLE.    GAIT: 10/15/2022:  Independent  Eval: Distance walked: 73' Assistive device utilized: Environmental consultant - 2 wheeled Level of assistance: SBA Comments: antalgic pattern with decreased RLE stance, knee flexed in stance, no knee flexion for swing, limited WB RLE due to pain & weakness.      TODAY'S TREATMENT                                                                        DATE: 10/29/2022: Therapeutic Exercise: Recumbent bike partial circles stretching 6.5 mins, seat 5 Incline gastroc stretch 30 sec x 5 bilateral Seated alternating isometrics Rt knee extension/flexion 5 sec hold x 12 Seated LAQ c strap assist for TKE concentric with eccentric focus in lowering 2 x 10  Supine LAQ to available range 2 x 10  Leg Press BLEs 93# x 20  RLE only 50# 2 x 10 with 5 sec hold into flexion  Manual Therapy: Seated Rt knee flexion mobilization with movement with distraction/IR.  Contract/relax techniques for flexion mobility  gains.    Modalities: Vasopneumatic right knee 34 degrees x 15 minutes medium compression with elevation  TODAY'S TREATMENT                                                                        DATE: 10/22/2022: Therapeutic Exercise: Nustep Lvl 5 UE/LE for ROM 8 mins  Seated alternating isometrics Rt knee extension/flexion 5 sec hold x 12 Seated LAQ c pause in end ranges x15 Leg Press BLEs 93# x 20  RLE only 50# 2 x 10 with 5 sec hold into flexion Seated quad set 5 sec hold x 10 Seated SLR x x2 Incline gastroc stretch 30 sec x 3 bilateral   Manual Therapy: Seated Rt knee flexion mobilization with movement with  distraction/IR.  Contract/relax techniques for flexion mobility gains.    Modalities: Vasopneumatic right knee 34 degrees x 10 minutes medium compression with elevation  TODAY'S TREATMENT                                                                        DATE: 10/17/2022: Therapeutic Exercise: Nustep Lvl 5 UE/LE for ROM 8 mins  Seated alternating isometrics Rt knee extension/flexion 5 sec hold x 12 Seated LAQ 3 lbs c pause in end ranges 2 x 10 Supine heel slide AROM 5 sec hold x 10   Leg Press BLEs 93# x 20  RLE only 50# 2 x 10 with 5 sec hold into flexion Lateral step down 4 inch step WB on Rt leg 2 x 10 c single hand assist light touch Incline gastroc stretch 30 sec x 3 bilateral   Manual Therapy: Seated Rt knee flexion mobilization with movement with distraction/IR.  Contract/relax techniques for flexion mobility gains.    Modalities: Vasopneumatic right knee 34 degrees x 10 minutes medium compression with elevation  TODAY'S TREATMENT                                                                        DATE: 10/15/2022: Therapeutic Exercise: Seated alternating isometrics Rt knee extension/flexion 5 sec hold x 10 Seated LAQ sitting c end range pause each direction x 10 (cues for use throughout day) Supine LAQ c hip in 90 deg flexion c belt holding leg x 10 (cues for use at home for edema)  Leg Press BLEs 93# x 15  RLE only 50# 2 x 10 with 5 sec hold into flexion Step up fwd 4 inch c blue band TKE hold in stance on step Rt leg 2 x 10 with single hand on rail anteriorly  Supine quad set Rt 5 sec hold x 10   Manual Therapy: Seated Rt knee flexion mobilization with movement with distraction/IR.  Contract/relax techniques for  flexion mobility gains.    Modalities: Vasopneumatic right knee 34 degrees x 10 minutes medium compression with elevation    PATIENT EDUCATION:  Education details: rationale for interventions, HEP Person educated: Patient Education method:  Consulting civil engineer, Demonstration, Verbal cues, and Handouts Education comprehension: verbalized understanding, returned demonstration, and verbal cues required   HOME EXERCISE PROGRAM: Access Code: [email or web address] URL: [email or web address] Date: 09/17/2022 Prepared by: Jamey Reas   Exercises - Ankle Alphabet in Elevation  - 2-4 x daily - 7 x weekly - 1 sets - 1 reps - Quad Setting and Stretching  - 2-4 x daily - 7 x weekly - 5-10 sets - 10 reps - prop 5-10 minutes & quad set5 seconds hold - Supine Heel Slide with Strap  - 2-3 x daily - 7 x weekly - 2-3 sets - 10 reps - 5 seconds hold - Supine Knee Extension Strengthening  - 2-3 x daily - 7 x weekly - 2-3 sets - 10 reps - 5 seconds hold - Supine Straight Leg Raises  - 2-3 x daily - 7 x weekly - 2-3 sets - 10 reps - 5 seconds hold - Seated Knee Flexion Extension AROM   - 2-4 x daily - 7 x weekly - 2-3 sets - 10 reps - 5 seconds hold - Seated Hamstring Stretch with Strap  - 2-4 x daily - 7 x weekly - 1 sets - 3 reps - 20-30 seconds hold   ASSESSMENT:   CLINICAL IMPRESSION: Visual inspection revealed noted localized edema into Rt knee today.   Limitations in flexion mobility active and passively still noted and painful in end range.  Extension actively limited due to muscle weakness.  Continued strengthening of quad to improve performance in WB activity during day and continued focus on mobility gains c edema control important at this time.   OBJECTIVE IMPAIRMENTS: Abnormal gait, decreased activity tolerance, decreased balance, decreased endurance, decreased knowledge of use of DME, decreased mobility, difficulty walking, decreased ROM, decreased strength, increased edema, increased muscle spasms, impaired flexibility, postural dysfunction, and pain.    ACTIVITY LIMITATIONS: carrying, lifting, bending, sitting, standing, squatting, sleeping, stairs, transfers, bed mobility, and locomotion level   PARTICIPATION LIMITATIONS: meal prep,  cleaning, laundry, driving, community activity, occupation, and church   PERSONAL FACTORS: Profession and 1 comorbidity: see PMH  are also affecting patient's functional outcome.    REHAB POTENTIAL: Good   CLINICAL DECISION MAKING: Stable/uncomplicated   EVALUATION COMPLEXITY: Low     GOALS: Goals reviewed with patient? Yes   SHORT TERM GOALS: (target date for Short term goals 10/19/2022)    1.  Patient will demonstrate independent use of home exercise program to maintain progress from in clinic treatments.   Goal status: Met 10/15/2022   LONG TERM GOALS: (target dates for all long term goals are 10 weeks  11/23/2022 )   1. Patient will demonstrate/report pain at worst less than or equal to 2/10 to facilitate minimal limitation in daily activity secondary to pain symptoms.   Goal status: on going 10/15/2022   2. Patient will demonstrate independent use of home exercise program to facilitate ability to maintain/progress functional gains from skilled physical therapy services.   Goal status: on going 10/15/2022   3. Patient will demonstrate FOTO outcome > or = 74 % to indicate reduced disability due to condition.   Goal status: on going 10/15/2022   4.  Patient will demonstrate right LE MMT 5/5 throughout to faciltiate usual transfers, stairs, squatting at [facility] for daily life.  Goal status: on going 10/15/2022   5.  Patient PROM right knee 0* - 110* Goal status: on going 10/15/2022   6.  Patient ambulates >500' negotiates ramps, curbs & stairs single rail without device independently. Goal status: on going 10/15/2022   7.  patient verbalizes understanding of ongoing HEP.  Goal Status: on going 10/15/2022     PLAN:   PT FREQUENCY: 2-3x/wk    PT DURATION: 10 weeks   PLANNED INTERVENTIONS: Therapeutic exercises, Therapeutic activity, Neuromuscular re-education, Balance training, Gait training, Patient/Family education, Self Care, Joint mobilization, Stair training, Vestibular  training, DME instructions, Dry Needling, Electrical stimulation, Cryotherapy, Moist heat, scar mobilization, Taping, Vasopneumatic device, Ultrasound, Manual therapy, and Re-evaluation   PLAN FOR NEXT SESSION:  Continue to improve active knee extension, flexion mobility and quad strength.   Scot Jun, PT, DPT, OCS, ATC 10/29/22  8:47 AM

## 2022-10-31 ENCOUNTER — Encounter: Payer: Self-pay | Admitting: Rehabilitative and Restorative Service Providers"

## 2022-10-31 ENCOUNTER — Ambulatory Visit: Payer: Federal, State, Local not specified - PPO | Admitting: Rehabilitative and Restorative Service Providers"

## 2022-10-31 DIAGNOSIS — R600 Localized edema: Secondary | ICD-10-CM | POA: Diagnosis not present

## 2022-10-31 DIAGNOSIS — M25561 Pain in right knee: Secondary | ICD-10-CM

## 2022-10-31 DIAGNOSIS — R2681 Unsteadiness on feet: Secondary | ICD-10-CM

## 2022-10-31 DIAGNOSIS — M6281 Muscle weakness (generalized): Secondary | ICD-10-CM

## 2022-10-31 DIAGNOSIS — M25661 Stiffness of right knee, not elsewhere classified: Secondary | ICD-10-CM

## 2022-10-31 DIAGNOSIS — R2689 Other abnormalities of gait and mobility: Secondary | ICD-10-CM

## 2022-10-31 NOTE — Therapy (Signed)
OUTPATIENT PHYSICAL THERAPY TREATMENT NOTE   Patient Name: Shelby Espinoza MRN: [id number] DOB:03-07-1966, 57 y.o., female Today's Date: 10/31/2022  PCP:  Dorothyann Peng, NP   REFERRING PROVIDER:  Leandrew Koyanagi, MD     END OF SESSION:   PT End of Session - 10/31/22 0802     Visit Number 13    Number of Visits 22    Date for PT Re-Evaluation 11/23/22    Authorization Type [employer] Employee    Authorization Time Period $25 COPAY, DED MET, OOP Individual FEP BLUE FOCUS Remaining $[phone]    Progress Note Due on Visit 18    PT Start Time 0759    PT Stop Time 0827    PT Time Calculation (min) 28 min    Activity Tolerance Patient limited by pain    Behavior During Therapy [facility] for tasks assessed/performed                       Past Medical History:  Diagnosis Date   Arthritis    GERD (gastroesophageal reflux disease)    Hemorrhoids    Hypothyroidism    Past Surgical History:  Procedure Laterality Date   APPENDECTOMY  1985   BREAST REDUCTION SURGERY Bilateral [place]  2022   TOTAL KNEE ARTHROPLASTY Right 08/29/2022   Procedure: RIGHT TOTAL KNEE ARTHROPLASTY;  Surgeon: Leandrew Koyanagi, MD;  Location: [place];  Service: Orthopedics;  Laterality: Right;   TUBAL LIGATION  1998   Patient Active Problem List   Diagnosis Date Noted   Status post total right knee replacement 08/29/2022   Primary osteoarthritis of right knee 07/25/2022   Viral URI 05/17/2018   Hypothyroidism 05/02/2007    REFERRING DIAG:  H41.937 (ICD-10-CM) - Status post total right knee replacement  08/29/22  THERAPY DIAG:  Acute pain of right knee  Stiffness of right knee, not elsewhere classified  Localized edema  Other abnormalities of gait and mobility  Unsteadiness on feet  Muscle weakness (generalized)  Rationale for Evaluation and Treatment Rehabilitation  PERTINENT HISTORY: OA, breast reduction sg   PRECAUTIONS:  none  SUBJECTIVE:                                                                                                                                                                                      SUBJECTIVE STATEMENT:   She reported having to leave at 8:30 today.  She indicated having new thigh related pain c a "out of nowhere" type pain.    PAIN:  Scale: 3-4/10 Location: Rt anterior knee Aggravating: standing,  walking & swelling Alleviating: elevation, ice, meds   OBJECTIVE: (objective measures completed at initial evaluation unless otherwise dated)  DIAGNOSTIC FINDINGS: 08/29/2022 X-ray Total knee arthroplasty. Prosthetic components are well seated. Expected soft tissue changes in the anterior kne   PATIENT SURVEYS:  10/15/2022 FOTO update:  61  Eval: FOTO 55% and predicted 74%   EDEMA:  Eval RLE: above knee 58.3 cm  around knee 51cm  below knee 45.1cm LLE:: above knee 53.8cm  around knee 45.1cm  below knee  39cm   POSTURE:  Eval flexed trunk  and weight shift left   PALPATION: Eval Tenderness along incision, patella and joint line.  Denies tenderness over quad, hamstring and gastroc.    LOWER EXTREMITY ROM:    ROM Right eval Rt 09/18/22 Right 09/21/22 Right 09/28/22 Right 10/04/22 Right 10/09/22 Right 10/17/2022 Right 10/31/2022  Knee flexion Supine P: 68* Seated  A: 54* P: 65* Supine A: 58 P: 65  Supine AA: 76 Supine AA: 80 deg  Supine P: 83* A: 71* Seated P: 88* Supine heel slide AROM: 91 Supine AROM heel slide 91   Knee extension Seated A: LAQ -55* Quad set -9* P: -2* Supine A: -5 P: -2  A: -15 (seated LAQ)    Seated LAQ AROM -12 Seated LAQ AROM: -10   (Blank rows = not tested)   LOWER EXTREMITY MMT:   MMT Right eval Right 10/29/2022  Hip flexion      Hip extension      Hip abduction      Hip adduction      Hip internal rotation      Hip external rotation      Knee flexion 2/5    Knee extension 2-/5 Minimal quad activitation 2+/5  (unable to perform full range against gravity   Ankle dorsiflexion      Ankle plantarflexion      Ankle inversion      Ankle eversion       (Blank rows = not tested)     FUNCTIONAL TESTS:  Eval 18 inch chair transfer: requires use of BUEs to push up. Lt SLS: unable to perform as lifting RLE was difficult Rt SLS: unable Supine <> sit: has to use LLE to manage RLE.    GAIT: 10/15/2022:  Independent  Eval: Distance walked: 80' Assistive device utilized: Environmental consultant - 2 wheeled Level of assistance: SBA Comments: antalgic pattern with decreased RLE stance, knee flexed in stance, no knee flexion for swing, limited WB RLE due to pain & weakness.      TODAY'S TREATMENT                                                                        DATE: 10/31/2022: Therapeutic Exercise: UBE partial circles, seat 10 7 mins Seated AAROM Rt knee flexion c Lt leg overpressure 30 sec hold x 3  Seated LAQ c strap assist for TKE concentric with eccentric focus in lowering x15 Supine heel slide x 1  Manual Therapy: Seated Rt knee flexion mobilization with movement with distraction/IR.  Contract/relax techniques for flexion mobility gains.  Percussive device self applied to Rt quad during manual therapy.    TODAY'S TREATMENT  DATE: 10/29/2022: Therapeutic Exercise: Recumbent bike partial circles stretching 6.5 mins, seat 5 Incline gastroc stretch 30 sec x 5 bilateral Seated alternating isometrics Rt knee extension/flexion 5 sec hold x 12 Seated LAQ c strap assist for TKE concentric with eccentric focus in lowering 2 x 10  Supine LAQ to available range 2 x 10  Leg Press BLEs 93# x 20  RLE only 50# 2 x 10 with 5 sec hold into flexion  Manual Therapy: Seated Rt knee flexion mobilization with movement with distraction/IR.  Contract/relax techniques for flexion mobility gains.    Modalities: Vasopneumatic right knee 34 degrees x 15  minutes medium compression with elevation  TODAY'S TREATMENT                                                                        DATE: 10/22/2022: Therapeutic Exercise: Nustep Lvl 5 UE/LE for ROM 8 mins  Seated alternating isometrics Rt knee extension/flexion 5 sec hold x 12 Seated LAQ c pause in end ranges x15 Leg Press BLEs 93# x 20  RLE only 50# 2 x 10 with 5 sec hold into flexion Seated quad set 5 sec hold x 10 Seated SLR x x2 Incline gastroc stretch 30 sec x 3 bilateral   Manual Therapy: Seated Rt knee flexion mobilization with movement with distraction/IR.  Contract/relax techniques for flexion mobility gains.    Modalities: Vasopneumatic right knee 34 degrees x 10 minutes medium compression with elevation  TODAY'S TREATMENT                                                                        DATE: 10/17/2022: Therapeutic Exercise: Nustep Lvl 5 UE/LE for ROM 8 mins  Seated alternating isometrics Rt knee extension/flexion 5 sec hold x 12 Seated LAQ 3 lbs c pause in end ranges 2 x 10 Supine heel slide AROM 5 sec hold x 10   Leg Press BLEs 93# x 20  RLE only 50# 2 x 10 with 5 sec hold into flexion Lateral step down 4 inch step WB on Rt leg 2 x 10 c single hand assist light touch Incline gastroc stretch 30 sec x 3 bilateral   Manual Therapy: Seated Rt knee flexion mobilization with movement with distraction/IR.  Contract/relax techniques for flexion mobility gains.    Modalities: Vasopneumatic right knee 34 degrees x 10 minutes medium compression with elevation     PATIENT EDUCATION:  Education details: rationale for interventions, HEP Person educated: Patient Education method: Consulting civil engineer, Demonstration, Verbal cues, and Handouts Education comprehension: verbalized understanding, returned demonstration, and verbal cues required   HOME EXERCISE PROGRAM: Access Code: [email or web address] URL: [email or web address] Date: 09/17/2022 Prepared by: Jamey Reas    Exercises - Ankle Alphabet in Elevation  - 2-4 x daily - 7 x weekly - 1 sets - 1 reps - Quad Setting and Stretching  - 2-4 x daily - 7 x weekly - 5-10 sets - 10 reps - prop 5-10 minutes & quad set5 seconds hold -  Supine Heel Slide with Strap  - 2-3 x daily - 7 x weekly - 2-3 sets - 10 reps - 5 seconds hold - Supine Knee Extension Strengthening  - 2-3 x daily - 7 x weekly - 2-3 sets - 10 reps - 5 seconds hold - Supine Straight Leg Raises  - 2-3 x daily - 7 x weekly - 2-3 sets - 10 reps - 5 seconds hold - Seated Knee Flexion Extension AROM   - 2-4 x daily - 7 x weekly - 2-3 sets - 10 reps - 5 seconds hold - Seated Hamstring Stretch with Strap  - 2-4 x daily - 7 x weekly - 1 sets - 3 reps - 20-30 seconds hold   ASSESSMENT:   CLINICAL IMPRESSION: Continued stiffness in movement noted, evident in various progressive mobility positioning and ambulation.   Deficits in mobility and strength still noted and impacting functional movement patterns.  Emphasis given on importance of routine consistent HEP use.   OBJECTIVE IMPAIRMENTS: Abnormal gait, decreased activity tolerance, decreased balance, decreased endurance, decreased knowledge of use of DME, decreased mobility, difficulty walking, decreased ROM, decreased strength, increased edema, increased muscle spasms, impaired flexibility, postural dysfunction, and pain.    ACTIVITY LIMITATIONS: carrying, lifting, bending, sitting, standing, squatting, sleeping, stairs, transfers, bed mobility, and locomotion level   PARTICIPATION LIMITATIONS: meal prep, cleaning, laundry, driving, community activity, occupation, and church   PERSONAL FACTORS: Profession and 1 comorbidity: see PMH  are also affecting patient's functional outcome.    REHAB POTENTIAL: Good   CLINICAL DECISION MAKING: Stable/uncomplicated   EVALUATION COMPLEXITY: Low     GOALS: Goals reviewed with patient? Yes   SHORT TERM GOALS: (target date for Short term goals 10/19/2022)    1.   Patient will demonstrate independent use of home exercise program to maintain progress from in clinic treatments.   Goal status: Met 10/15/2022   LONG TERM GOALS: (target dates for all long term goals are 10 weeks  11/23/2022 )   1. Patient will demonstrate/report pain at worst less than or equal to 2/10 to facilitate minimal limitation in daily activity secondary to pain symptoms.   Goal status: on going 10/15/2022   2. Patient will demonstrate independent use of home exercise program to facilitate ability to maintain/progress functional gains from skilled physical therapy services.   Goal status: on going 10/15/2022   3. Patient will demonstrate FOTO outcome > or = 74 % to indicate reduced disability due to condition.   Goal status: on going 10/15/2022   4.  Patient will demonstrate right LE MMT 5/5 throughout to faciltiate usual transfers, stairs, squatting at [facility] for daily life.    Goal status: on going 10/15/2022   5.  Patient PROM right knee 0* - 110* Goal status: on going 10/15/2022   6.  Patient ambulates >500' negotiates ramps, curbs & stairs single rail without device independently. Goal status: on going 10/15/2022   7.  patient verbalizes understanding of ongoing HEP.  Goal Status: on going 10/15/2022     PLAN:   PT FREQUENCY: 2-3x/wk    PT DURATION: 10 weeks   PLANNED INTERVENTIONS: Therapeutic exercises, Therapeutic activity, Neuromuscular re-education, Balance training, Gait training, Patient/Family education, Self Care, Joint mobilization, Stair training, Vestibular training, DME instructions, Dry Needling, Electrical stimulation, Cryotherapy, Moist heat, scar mobilization, Taping, Vasopneumatic device, Ultrasound, Manual therapy, and Re-evaluation   PLAN FOR NEXT SESSION:  Improve knee mobility and quad strength.    Scot Jun, PT, DPT, OCS, ATC 10/31/22  8:29 AM

## 2022-11-05 ENCOUNTER — Encounter: Payer: Self-pay | Admitting: Rehabilitative and Restorative Service Providers"

## 2022-11-05 ENCOUNTER — Ambulatory Visit: Payer: Federal, State, Local not specified - PPO | Admitting: Rehabilitative and Restorative Service Providers"

## 2022-11-05 DIAGNOSIS — R2689 Other abnormalities of gait and mobility: Secondary | ICD-10-CM

## 2022-11-05 DIAGNOSIS — R600 Localized edema: Secondary | ICD-10-CM | POA: Diagnosis not present

## 2022-11-05 DIAGNOSIS — R2681 Unsteadiness on feet: Secondary | ICD-10-CM

## 2022-11-05 DIAGNOSIS — M6281 Muscle weakness (generalized): Secondary | ICD-10-CM

## 2022-11-05 DIAGNOSIS — M25561 Pain in right knee: Secondary | ICD-10-CM | POA: Diagnosis not present

## 2022-11-05 DIAGNOSIS — M25661 Stiffness of right knee, not elsewhere classified: Secondary | ICD-10-CM

## 2022-11-05 NOTE — Therapy (Signed)
OUTPATIENT PHYSICAL THERAPY TREATMENT NOTE   Patient Name: Shelby Espinoza MRN: [id number] DOB:03/01/1966, 57 y.o., female Today's Date: 11/05/2022  PCP:  Dorothyann Peng, NP   REFERRING PROVIDER:  Leandrew Koyanagi, MD     END OF SESSION:   PT End of Session - 11/05/22 0810     Visit Number 14    Number of Visits 22    Date for PT Re-Evaluation 11/23/22    Authorization Type [employer] Employee    Authorization Time Period $25 COPAY, DED MET, OOP Individual FEP BLUE FOCUS Remaining $[phone]    Progress Note Due on Visit 18    PT Start Time 0800    PT Stop Time 0850    PT Time Calculation (min) 50 min    Activity Tolerance Patient tolerated treatment well    Behavior During Therapy WFL for tasks assessed/performed                        Past Medical History:  Diagnosis Date   Arthritis    GERD (gastroesophageal reflux disease)    Hemorrhoids    Hypothyroidism    Past Surgical History:  Procedure Laterality Date   APPENDECTOMY  1985   BREAST REDUCTION SURGERY Bilateral [place]   COLONOSCOPY  2022   TOTAL KNEE ARTHROPLASTY Right 08/29/2022   Procedure: RIGHT TOTAL KNEE ARTHROPLASTY;  Surgeon: Leandrew Koyanagi, MD;  Location: [place];  Service: Orthopedics;  Laterality: Right;   TUBAL LIGATION  1998   Patient Active Problem List   Diagnosis Date Noted   Status post total right knee replacement 08/29/2022   Primary osteoarthritis of right knee 07/25/2022   Viral URI 05/17/2018   Hypothyroidism 05/02/2007    REFERRING DIAG:  V37.106 (ICD-10-CM) - Status post total right knee replacement  08/29/22  THERAPY DIAG:  Acute pain of right knee  Stiffness of right knee, not elsewhere classified  Localized edema  Other abnormalities of gait and mobility  Unsteadiness on feet  Muscle weakness (generalized)  Rationale for Evaluation and Treatment Rehabilitation  PERTINENT HISTORY: OA, breast reduction sg   PRECAUTIONS:  none  SUBJECTIVE:                                                                                                                                                                                      SUBJECTIVE STATEMENT:   She indicated similar overall complaints with stiffness/tightness and swelling noted.    PAIN:  Scale: 3-4/10 Location: Rt anterior knee Aggravating: standing, walking & swelling Alleviating: elevation, ice, meds   OBJECTIVE: (objective measures  completed at initial evaluation unless otherwise dated)  DIAGNOSTIC FINDINGS: 08/29/2022 X-ray Total knee arthroplasty. Prosthetic components are well seated. Expected soft tissue changes in the anterior kne   PATIENT SURVEYS:  10/15/2022 FOTO update:  61  Eval: FOTO 55% and predicted 74%   EDEMA:  Eval RLE: above knee 58.3 cm  around knee 51cm  below knee 45.1cm LLE:: above knee 53.8cm  around knee 45.1cm  below knee  39cm   POSTURE:  Eval flexed trunk  and weight shift left   PALPATION: Eval Tenderness along incision, patella and joint line.  Denies tenderness over quad, hamstring and gastroc.    LOWER EXTREMITY ROM:    ROM Right eval Rt 09/18/22 Right 09/21/22 Right 09/28/22 Right 10/04/22 Right 10/09/22 Right 10/17/2022 Right 10/31/2022  Knee flexion Supine P: 68* Seated  A: 54* P: 65* Supine A: 58 P: 65  Supine AA: 76 Supine AA: 80 deg  Supine P: 83* A: 71* Seated P: 88* Supine heel slide AROM: 91 Supine AROM heel slide 91   Knee extension Seated A: LAQ -55* Quad set -9* P: -2* Supine A: -5 P: -2  A: -15 (seated LAQ)    Seated LAQ AROM -12 Seated LAQ AROM: -10   (Blank rows = not tested)   LOWER EXTREMITY MMT:   MMT Right eval Right 10/29/2022  Hip flexion      Hip extension      Hip abduction      Hip adduction      Hip internal rotation      Hip external rotation      Knee flexion 2/5    Knee extension 2-/5 Minimal quad activitation 2+/5 (unable to perform full range against  gravity   Ankle dorsiflexion      Ankle plantarflexion      Ankle inversion      Ankle eversion       (Blank rows = not tested)     FUNCTIONAL TESTS:  Eval 18 inch chair transfer: requires use of BUEs to push up. Lt SLS: unable to perform as lifting RLE was difficult Rt SLS: unable Supine <> sit: has to use LLE to manage RLE.    GAIT: 10/15/2022:  Independent  Eval: Distance walked: 41' Assistive device utilized: Environmental consultant - 2 wheeled Level of assistance: SBA Comments: antalgic pattern with decreased RLE stance, knee flexed in stance, no knee flexion for swing, limited WB RLE due to pain & weakness.      TODAY'S TREATMENT                                                                        DATE: 11/05/2022: Therapeutic Exercise: UBE LE only full revolution reverse and forward intermittently 8 mins  Seated isometric alternating 5 sec holds for Rt knee extension and flexion x12 each in 75 deg flexion, 45 deg flexion Leg press Rt leg only 50 lbs 2 x 15 c 30 sec stretch in flexion after set Incline gastroc stretch 1 min x 3 bilateral  Step up 4 inch x 15 Rt leg with hand rail assist   Manual Therapy: Seated Rt knee flexion mobilization with movement with distraction/IR.  Contract/relax techniques for flexion mobility gains.  Percussive device self applied  to Rt quad during manual therapy.   Modalities: Vasopneumatic right knee 34 degrees x 10 minutes medium compression with elevation  TODAY'S TREATMENT                                                                        DATE: 10/31/2022: Therapeutic Exercise: UBE partial circles, seat 10 7 mins Seated AAROM Rt knee flexion c Lt leg overpressure 30 sec hold x 3  Seated LAQ c strap assist for TKE concentric with eccentric focus in lowering x15 Supine heel slide x 1  Manual Therapy: Seated Rt knee flexion mobilization with movement with distraction/IR.  Contract/relax techniques for flexion mobility gains.  Percussive device self  applied to Rt quad during manual therapy.    TODAY'S TREATMENT                                                                        DATE: 10/29/2022: Therapeutic Exercise: Recumbent bike partial circles stretching 6.5 mins, seat 5 Incline gastroc stretch 30 sec x 5 bilateral Seated alternating isometrics Rt knee extension/flexion 5 sec hold x 12 Seated LAQ c strap assist for TKE concentric with eccentric focus in lowering 2 x 10  Supine LAQ to available range 2 x 10  Leg Press BLEs 93# x 20  RLE only 50# 2 x 10 with 5 sec hold into flexion  Manual Therapy: Seated Rt knee flexion mobilization with movement with distraction/IR.  Contract/relax techniques for flexion mobility gains.    Modalities: Vasopneumatic right knee 34 degrees x 15 minutes medium compression with elevation  TODAY'S TREATMENT                                                                        DATE: 10/22/2022: Therapeutic Exercise: Nustep Lvl 5 UE/LE for ROM 8 mins  Seated alternating isometrics Rt knee extension/flexion 5 sec hold x 12 Seated LAQ c pause in end ranges x15 Leg Press BLEs 93# x 20  RLE only 50# 2 x 10 with 5 sec hold into flexion Seated quad set 5 sec hold x 10 Seated SLR x x2 Incline gastroc stretch 30 sec x 3 bilateral   Manual Therapy: Seated Rt knee flexion mobilization with movement with distraction/IR.  Contract/relax techniques for flexion mobility gains.    Modalities: Vasopneumatic right knee 34 degrees x 10 minutes medium compression with elevation   PATIENT EDUCATION:  Education details: rationale for interventions, HEP Person educated: Patient Education method: Consulting civil engineer, Demonstration, Verbal cues, and Handouts Education comprehension: verbalized understanding, returned demonstration, and verbal cues required   HOME EXERCISE PROGRAM: Access Code: [email or web address] URL: [email or web address] Date: 09/17/2022 Prepared by: Jamey Reas   Exercises - Ankle  Alphabet in Elevation  - 2-4  x daily - 7 x weekly - 1 sets - 1 reps - Quad Setting and Stretching  - 2-4 x daily - 7 x weekly - 5-10 sets - 10 reps - prop 5-10 minutes & quad set5 seconds hold - Supine Heel Slide with Strap  - 2-3 x daily - 7 x weekly - 2-3 sets - 10 reps - 5 seconds hold - Supine Knee Extension Strengthening  - 2-3 x daily - 7 x weekly - 2-3 sets - 10 reps - 5 seconds hold - Supine Straight Leg Raises  - 2-3 x daily - 7 x weekly - 2-3 sets - 10 reps - 5 seconds hold - Seated Knee Flexion Extension AROM   - 2-4 x daily - 7 x weekly - 2-3 sets - 10 reps - 5 seconds hold - Seated Hamstring Stretch with Strap  - 2-4 x daily - 7 x weekly - 1 sets - 3 reps - 20-30 seconds hold   ASSESSMENT:   CLINICAL IMPRESSION: Pain and firm end feels noted in extension and flexion mobility for Rt knee.  Continued efforts with combination of various manual therapy approaches to improve myofascial mobility and capsular stretching.  Continued strengthening for quad to help improve functional movements during day as well. Continued skilled PT services recommended.   OBJECTIVE IMPAIRMENTS: Abnormal gait, decreased activity tolerance, decreased balance, decreased endurance, decreased knowledge of use of DME, decreased mobility, difficulty walking, decreased ROM, decreased strength, increased edema, increased muscle spasms, impaired flexibility, postural dysfunction, and pain.    ACTIVITY LIMITATIONS: carrying, lifting, bending, sitting, standing, squatting, sleeping, stairs, transfers, bed mobility, and locomotion level   PARTICIPATION LIMITATIONS: meal prep, cleaning, laundry, driving, community activity, occupation, and church   PERSONAL FACTORS: Profession and 1 comorbidity: see PMH  are also affecting patient's functional outcome.    REHAB POTENTIAL: Good   CLINICAL DECISION MAKING: Stable/uncomplicated   EVALUATION COMPLEXITY: Low     GOALS: Goals reviewed with patient? Yes   SHORT TERM  GOALS: (target date for Short term goals 10/19/2022)    1.  Patient will demonstrate independent use of home exercise program to maintain progress from in clinic treatments.   Goal status: Met 10/15/2022   LONG TERM GOALS: (target dates for all long term goals are 10 weeks  11/23/2022 )   1. Patient will demonstrate/report pain at worst less than or equal to 2/10 to facilitate minimal limitation in daily activity secondary to pain symptoms.   Goal status: on going 10/15/2022   2. Patient will demonstrate independent use of home exercise program to facilitate ability to maintain/progress functional gains from skilled physical therapy services.   Goal status: on going 10/15/2022   3. Patient will demonstrate FOTO outcome > or = 74 % to indicate reduced disability due to condition.   Goal status: on going 10/15/2022   4.  Patient will demonstrate right LE MMT 5/5 throughout to faciltiate usual transfers, stairs, squatting at [facility] for daily life.    Goal status: on going 10/15/2022   5.  Patient PROM right knee 0* - 110* Goal status: on going 10/15/2022   6.  Patient ambulates >500' negotiates ramps, curbs & stairs single rail without device independently. Goal status: on going 10/15/2022   7.  patient verbalizes understanding of ongoing HEP.  Goal Status: on going 10/15/2022     PLAN:   PT FREQUENCY: 2-3x/wk    PT DURATION: 10 weeks   PLANNED INTERVENTIONS: Therapeutic exercises, Therapeutic activity, Neuromuscular re-education, Balance training,  Gait training, Patient/Family education, Self Care, Joint mobilization, Stair training, Vestibular training, DME instructions, Dry Needling, Electrical stimulation, Cryotherapy, Moist heat, scar mobilization, Taping, Vasopneumatic device, Ultrasound, Manual therapy, and Re-evaluation   PLAN FOR NEXT SESSION: Recheck strength/ROM.   Improve knee mobility and quad strength continued focus.    Scot Jun, PT, DPT, OCS, ATC 11/05/22  8:40  AM

## 2022-11-07 ENCOUNTER — Encounter: Payer: Self-pay | Admitting: Rehabilitative and Restorative Service Providers"

## 2022-11-07 ENCOUNTER — Ambulatory Visit: Payer: Federal, State, Local not specified - PPO | Admitting: Rehabilitative and Restorative Service Providers"

## 2022-11-07 DIAGNOSIS — M25661 Stiffness of right knee, not elsewhere classified: Secondary | ICD-10-CM

## 2022-11-07 DIAGNOSIS — R600 Localized edema: Secondary | ICD-10-CM | POA: Diagnosis not present

## 2022-11-07 DIAGNOSIS — M6281 Muscle weakness (generalized): Secondary | ICD-10-CM

## 2022-11-07 DIAGNOSIS — M25561 Pain in right knee: Secondary | ICD-10-CM

## 2022-11-07 DIAGNOSIS — R2689 Other abnormalities of gait and mobility: Secondary | ICD-10-CM

## 2022-11-07 DIAGNOSIS — R2681 Unsteadiness on feet: Secondary | ICD-10-CM

## 2022-11-07 NOTE — Therapy (Signed)
OUTPATIENT PHYSICAL THERAPY TREATMENT NOTE   Patient Name: Shelby Espinoza MRN: [id number] DOB:10/21/1965, 57 y.o., female Today's Date: 11/07/2022  PCP:  Dorothyann Peng, NP   REFERRING PROVIDER:  Leandrew Koyanagi, MD     END OF SESSION:   PT End of Session - 11/07/22 0807     Visit Number 15    Number of Visits 22    Date for PT Re-Evaluation 11/23/22    Authorization Type [employer] Employee    Authorization Time Period $25 COPAY, DED MET, OOP Individual FEP BLUE FOCUS Remaining $[phone]    Progress Note Due on Visit 18    PT Start Time 0803    PT Stop Time 0853    PT Time Calculation (min) 50 min    Activity Tolerance Patient tolerated treatment well    Behavior During Therapy [facility] for tasks assessed/performed                         Past Medical History:  Diagnosis Date   Arthritis    GERD (gastroesophageal reflux disease)    Hemorrhoids    Hypothyroidism    Past Surgical History:  Procedure Laterality Date   APPENDECTOMY  1985   BREAST REDUCTION SURGERY Bilateral [place]   COLONOSCOPY  2022   TOTAL KNEE ARTHROPLASTY Right 08/29/2022   Procedure: RIGHT TOTAL KNEE ARTHROPLASTY;  Surgeon: Leandrew Koyanagi, MD;  Location: [place];  Service: Orthopedics;  Laterality: Right;   TUBAL LIGATION  1998   Patient Active Problem List   Diagnosis Date Noted   Status post total right knee replacement 08/29/2022   Primary osteoarthritis of right knee 07/25/2022   Viral URI 05/17/2018   Hypothyroidism 05/02/2007    REFERRING DIAG:  W09.811 (ICD-10-CM) - Status post total right knee replacement  08/29/22  THERAPY DIAG:  Acute pain of right knee  Stiffness of right knee, not elsewhere classified  Localized edema  Other abnormalities of gait and mobility  Unsteadiness on feet  Muscle weakness (generalized)  Rationale for Evaluation and Treatment Rehabilitation  PERTINENT HISTORY: OA, breast reduction sg   PRECAUTIONS:  none  SUBJECTIVE:                                                                                                                                                                                      SUBJECTIVE STATEMENT:   She indicated she started walking in the neighborhood about 15 mins without complaints.   PAIN:  Scale: no pain, just tight Location: Rt knee Aggravating: standing, walking & swelling Alleviating: elevation, ice, meds  OBJECTIVE: (objective measures completed at initial evaluation unless otherwise dated)  DIAGNOSTIC FINDINGS: 08/29/2022 X-ray Total knee arthroplasty. Prosthetic components are well seated. Expected soft tissue changes in the anterior kne   PATIENT SURVEYS:  10/15/2022 FOTO update:  61  Eval: FOTO 55% and predicted 74%   EDEMA:  Eval RLE: above knee 58.3 cm  around knee 51cm  below knee 45.1cm LLE:: above knee 53.8cm  around knee 45.1cm  below knee  39cm   POSTURE:  Eval flexed trunk  and weight shift left   PALPATION: Eval Tenderness along incision, patella and joint line.  Denies tenderness over quad, hamstring and gastroc.    LOWER EXTREMITY ROM:    ROM Right eval Rt 09/18/22 Right 09/21/22 Right 09/28/22 Right 10/04/22 Right 10/09/22 Right 10/17/2022 Right 10/31/2022 Right 11/07/2022  Knee flexion Supine P: 68* Seated  A: 54* P: 65* Supine A: 58 P: 65  Supine AA: 76 Supine AA: 80 deg  Supine P: 83* A: 71* Seated P: 88* Supine heel slide AROM: 91 Supine AROM heel slide 91  Supine AROM heel slide: 90   Knee extension Seated A: LAQ -55* Quad set -9* P: -2* Supine A: -5 P: -2  A: -15 (seated LAQ)    Seated LAQ AROM -12 Seated LAQ AROM: -10 Seated LAQ AROM:   (Blank rows = not tested)   LOWER EXTREMITY MMT:   MMT Right eval Right 10/29/2022 Left 11/07/2022 Right 11/07/2022  Hip flexion        Hip extension        Hip abduction        Hip adduction        Hip internal rotation        Hip external rotation         Knee flexion 2/5      Knee extension 2-/5 Minimal quad activitation 2+/5 (unable to perform full range against gravity  48, 40.5 lbs 23.2, 23.2 lbs  Ankle dorsiflexion        Ankle plantarflexion        Ankle inversion        Ankle eversion         (Blank rows = not tested)     FUNCTIONAL TESTS:  Eval 18 inch chair transfer: requires use of BUEs to push up. Lt SLS: unable to perform as lifting RLE was difficult Rt SLS: unable Supine <> sit: has to use LLE to manage RLE.    GAIT: 10/15/2022:  Independent  Eval: Distance walked: 41' Assistive device utilized: Environmental consultant - 2 wheeled Level of assistance: SBA Comments: antalgic pattern with decreased RLE stance, knee flexed in stance, no knee flexion for swing, limited WB RLE due to pain & weakness.      TODAY'S TREATMENT                                                                        DATE: 11/07/2022: Therapeutic Exercise: UBE LE only full revolution reverse and forward intermittently 8 mins  Seated Rt knee hamstring curl blue band x 15 Supine AROM heel slide 5 sec hold x 5  Leg extension machine double leg up , Rt leg lowering focus 2 x 10 c 30 sec rest into flexion  stretch after each set Leg press Rt leg only 50 lbs 2 x 15 c 30 sec stretch in flexion after set Incline gastroc stretch 1 min x 2 bilateral  Step up 6 inch x 15 Rt leg with hand rail assist bilateral Standing blue band TKE 5 sec hold x 15   Manual Therapy: Seated Rt knee flexion mobilization with movement with distraction/IR.  Contract/relax/hamstring contraction techniques for flexion mobility gains.  Percussive device self applied to Rt quad during manual therapy.   Modalities: Vasopneumatic right knee 34 degrees x 10 minutes medium compression with elevation  TODAY'S TREATMENT                                                                        DATE: 11/05/2022: Therapeutic Exercise: UBE LE only full revolution reverse and forward intermittently 8 mins   Seated isometric alternating 5 sec holds for Rt knee extension and flexion x12 each in 75 deg flexion, 45 deg flexion Leg press Rt leg only 50 lbs 2 x 15 c 30 sec stretch in flexion after set Incline gastroc stretch 1 min x 3 bilateral  Step up 4 inch x 15 Rt leg with hand rail assist   Manual Therapy: Seated Rt knee flexion mobilization with movement with distraction/IR.  Contract/relax techniques for flexion mobility gains.  Percussive device self applied to Rt quad during manual therapy.   Modalities: Vasopneumatic right knee 34 degrees x 10 minutes medium compression with elevation  TODAY'S TREATMENT                                                                        DATE: 10/31/2022: Therapeutic Exercise: UBE partial circles, seat 10 7 mins Seated AAROM Rt knee flexion c Lt leg overpressure 30 sec hold x 3  Seated LAQ c strap assist for TKE concentric with eccentric focus in lowering x15 Supine heel slide x 1  Manual Therapy: Seated Rt knee flexion mobilization with movement with distraction/IR.  Contract/relax techniques for flexion mobility gains.  Percussive device self applied to Rt quad during manual therapy.     PATIENT EDUCATION:  Education details: rationale for interventions, HEP Person educated: Patient Education method: Consulting civil engineer, Demonstration, Verbal cues, and Handouts Education comprehension: verbalized understanding, returned demonstration, and verbal cues required   HOME EXERCISE PROGRAM: Access Code: [email or web address] URL: [email or web address] Date: 09/17/2022 Prepared by: Jamey Reas   Exercises - Ankle Alphabet in Elevation  - 2-4 x daily - 7 x weekly - 1 sets - 1 reps - Quad Setting and Stretching  - 2-4 x daily - 7 x weekly - 5-10 sets - 10 reps - prop 5-10 minutes & quad set5 seconds hold - Supine Heel Slide with Strap  - 2-3 x daily - 7 x weekly - 2-3 sets - 10 reps - 5 seconds hold - Supine Knee Extension Strengthening  - 2-3 x daily -  7 x weekly - 2-3 sets - 10 reps - 5 seconds hold - Supine  Straight Leg Raises  - 2-3 x daily - 7 x weekly - 2-3 sets - 10 reps - 5 seconds hold - Seated Knee Flexion Extension AROM   - 2-4 x daily - 7 x weekly - 2-3 sets - 10 reps - 5 seconds hold - Seated Hamstring Stretch with Strap  - 2-4 x daily - 7 x weekly - 1 sets - 3 reps - 20-30 seconds hold   ASSESSMENT:   CLINICAL IMPRESSION: Flexion mobility was measured as similar but quality of improvement was improved greatly with reduced pain and difficulty to obtain end range.  Dynamometry measurements showed a little above 50% strength in extension on Rt when compared to Lt.  Continued emphasis on long duration stretching at home and continued strengthening at this time.   OBJECTIVE IMPAIRMENTS: Abnormal gait, decreased activity tolerance, decreased balance, decreased endurance, decreased knowledge of use of DME, decreased mobility, difficulty walking, decreased ROM, decreased strength, increased edema, increased muscle spasms, impaired flexibility, postural dysfunction, and pain.    ACTIVITY LIMITATIONS: carrying, lifting, bending, sitting, standing, squatting, sleeping, stairs, transfers, bed mobility, and locomotion level   PARTICIPATION LIMITATIONS: meal prep, cleaning, laundry, driving, community activity, occupation, and church   PERSONAL FACTORS: Profession and 1 comorbidity: see PMH  are also affecting patient's functional outcome.    REHAB POTENTIAL: Good   CLINICAL DECISION MAKING: Stable/uncomplicated   EVALUATION COMPLEXITY: Low     GOALS: Goals reviewed with patient? Yes   SHORT TERM GOALS: (target date for Short term goals 10/19/2022)    1.  Patient will demonstrate independent use of home exercise program to maintain progress from in clinic treatments.   Goal status: Met 10/15/2022   LONG TERM GOALS: (target dates for all long term goals are 10 weeks  11/23/2022 )   1. Patient will demonstrate/report pain at worst  less than or equal to 2/10 to facilitate minimal limitation in daily activity secondary to pain symptoms.   Goal status: on going 10/15/2022   2. Patient will demonstrate independent use of home exercise program to facilitate ability to maintain/progress functional gains from skilled physical therapy services.   Goal status: on going 10/15/2022   3. Patient will demonstrate FOTO outcome > or = 74 % to indicate reduced disability due to condition.   Goal status: on going 10/15/2022   4.  Patient will demonstrate right LE MMT 5/5 throughout to faciltiate usual transfers, stairs, squatting at [facility] for daily life.    Goal status: on going 10/15/2022   5.  Patient PROM right knee 0* - 110* Goal status: on going 10/15/2022   6.  Patient ambulates >500' negotiates ramps, curbs & stairs single rail without device independently. Goal status: on going 10/15/2022   7.  patient verbalizes understanding of ongoing HEP.  Goal Status: on going 10/15/2022     PLAN:   PT FREQUENCY: 2-3x/wk    PT DURATION: 10 weeks   PLANNED INTERVENTIONS: Therapeutic exercises, Therapeutic activity, Neuromuscular re-education, Balance training, Gait training, Patient/Family education, Self Care, Joint mobilization, Stair training, Vestibular training, DME instructions, Dry Needling, Electrical stimulation, Cryotherapy, Moist heat, scar mobilization, Taping, Vasopneumatic device, Ultrasound, Manual therapy, and Re-evaluation   PLAN FOR NEXT SESSION: Improve knee mobility and quad strength continued focus.   Longer duration stretching.    Scot Jun, PT, DPT, OCS, ATC 11/07/22  8:42 AM

## 2022-11-12 ENCOUNTER — Encounter: Payer: Self-pay | Admitting: Rehabilitative and Restorative Service Providers"

## 2022-11-12 ENCOUNTER — Ambulatory Visit (INDEPENDENT_AMBULATORY_CARE_PROVIDER_SITE_OTHER): Payer: Federal, State, Local not specified - PPO | Admitting: Rehabilitative and Restorative Service Providers"

## 2022-11-12 DIAGNOSIS — R2689 Other abnormalities of gait and mobility: Secondary | ICD-10-CM | POA: Diagnosis not present

## 2022-11-12 DIAGNOSIS — R600 Localized edema: Secondary | ICD-10-CM | POA: Diagnosis not present

## 2022-11-12 DIAGNOSIS — M25561 Pain in right knee: Secondary | ICD-10-CM

## 2022-11-12 DIAGNOSIS — R2681 Unsteadiness on feet: Secondary | ICD-10-CM

## 2022-11-12 DIAGNOSIS — M25661 Stiffness of right knee, not elsewhere classified: Secondary | ICD-10-CM | POA: Diagnosis not present

## 2022-11-12 DIAGNOSIS — M6281 Muscle weakness (generalized): Secondary | ICD-10-CM

## 2022-11-12 NOTE — Therapy (Signed)
OUTPATIENT PHYSICAL THERAPY TREATMENT NOTE   Patient Name: Shelby Espinoza MRN: [id number] DOB:30-Apr-1966, 57 y.o., female Today's Date: 11/12/2022  PCP:  Dorothyann Peng, NP   REFERRING PROVIDER:  Leandrew Koyanagi, MD     END OF SESSION:   PT End of Session - 11/12/22 0811     Visit Number 16    Number of Visits 22    Date for PT Re-Evaluation 11/23/22    Authorization Type [employer] Employee    Authorization Time Period $25 COPAY, DED MET, OOP Individual FEP BLUE FOCUS Remaining $[phone]    Progress Note Due on Visit 18    PT Start Time 0805    PT Stop Time 0855    PT Time Calculation (min) 50 min    Activity Tolerance Patient tolerated treatment well    Behavior During Therapy WFL for tasks assessed/performed                          Past Medical History:  Diagnosis Date   Arthritis    GERD (gastroesophageal reflux disease)    Hemorrhoids    Hypothyroidism    Past Surgical History:  Procedure Laterality Date   APPENDECTOMY  1985   BREAST REDUCTION SURGERY Bilateral [place]   COLONOSCOPY  2022   TOTAL KNEE ARTHROPLASTY Right 08/29/2022   Procedure: RIGHT TOTAL KNEE ARTHROPLASTY;  Surgeon: Leandrew Koyanagi, MD;  Location: [place];  Service: Orthopedics;  Laterality: Right;   TUBAL LIGATION  1998   Patient Active Problem List   Diagnosis Date Noted   Status post total right knee replacement 08/29/2022   Primary osteoarthritis of right knee 07/25/2022   Viral URI 05/17/2018   Hypothyroidism 05/02/2007    REFERRING DIAG:  [id number] (ICD-10-CM) - Status post total right knee replacement  08/29/22  THERAPY DIAG:  Acute pain of right knee  Stiffness of right knee, not elsewhere classified  Localized edema  Other abnormalities of gait and mobility  Unsteadiness on feet  Muscle weakness (generalized)  Rationale for Evaluation and Treatment Rehabilitation  PERTINENT HISTORY: OA, breast reduction sg   PRECAUTIONS:  none  SUBJECTIVE:                                                                                                                                                                                      SUBJECTIVE STATEMENT:   She indicated she was busy and active over the weekend.  She mentioned she was motivated to avoid manipulation  PAIN:  Scale: no pain upon arrival.  Tightness upon arrival.  Location:  Rt knee Aggravating: standing, walking & swelling Alleviating: elevation, ice, meds   OBJECTIVE: (objective measures completed at initial evaluation unless otherwise dated)  DIAGNOSTIC FINDINGS: 08/29/2022 X-ray Total knee arthroplasty. Prosthetic components are well seated. Expected soft tissue changes in the anterior kne   PATIENT SURVEYS:  10/15/2022 FOTO update:  61  Eval: FOTO 55% and predicted 74%   EDEMA:  Eval RLE: above knee 58.3 cm  around knee 51cm  below knee 45.1cm LLE:: above knee 53.8cm  around knee 45.1cm  below knee  39cm   POSTURE:  Eval flexed trunk  and weight shift left   PALPATION: Eval Tenderness along incision, patella and joint line.  Denies tenderness over quad, hamstring and gastroc.    LOWER EXTREMITY ROM:    ROM Right eval Rt 09/18/22 Right 09/21/22 Right 09/28/22 Right 10/04/22 Right 10/09/22 Right 10/17/2022 Right 10/31/2022 Right 11/07/2022  Knee flexion Supine P: 68* Seated  A: 54* P: 65* Supine A: 58 P: 65  Supine AA: 76 Supine AA: 80 deg  Supine P: 83* A: 71* Seated P: 88* Supine heel slide AROM: 91 Supine AROM heel slide 91  Supine AROM heel slide: 90   Knee extension Seated A: LAQ -55* Quad set -9* P: -2* Supine A: -5 P: -2  A: -15 (seated LAQ)    Seated LAQ AROM -12 Seated LAQ AROM: -10 Seated LAQ AROM:   (Blank rows = not tested)   LOWER EXTREMITY MMT:   MMT Right eval Right 10/29/2022 Left 11/07/2022 Right 11/07/2022  Hip flexion        Hip extension        Hip abduction        Hip adduction        Hip  internal rotation        Hip external rotation        Knee flexion 2/5      Knee extension 2-/5 Minimal quad activitation 2+/5 (unable to perform full range against gravity  48, 40.5 lbs 23.2, 23.2 lbs  Ankle dorsiflexion        Ankle plantarflexion        Ankle inversion        Ankle eversion         (Blank rows = not tested)     FUNCTIONAL TESTS:  Eval 18 inch chair transfer: requires use of BUEs to push up. Lt SLS: unable to perform as lifting RLE was difficult Rt SLS: unable Supine <> sit: has to use LLE to manage RLE.    GAIT: 10/15/2022:  Independent  Eval: Distance walked: 43' Assistive device utilized: Environmental consultant - 2 wheeled Level of assistance: SBA Comments: antalgic pattern with decreased RLE stance, knee flexed in stance, no knee flexion for swing, limited WB RLE due to pain & weakness.      TODAY'S TREATMENT                                                                        DATE: 11/12/2022: Therapeutic Exercise: UBE LE only full revolution reverse and forward intermittently 8 mins  Leg extension machine double leg up , Rt leg lowering focus 2 x 10  5 lbs, c LLLD slot 4 from back on machine 5  lbs 3 mins  - advice for seated chair LLLD stretching at home with foot on ground.  Leg press Rt leg only 50 lbs 2 x 15 Incline gastroc stretch 1 min x 2 bilateral  Step up 6 inch x 15 Rt leg with hand rail assist bilateral Standing blue band TKE 5 sec hold x 12  Heel prop foot in chair TKE stretch (cues for home), performed in first part of vaso.   Modalities: Vasopneumatic right knee 34 degrees x 10 minutes medium compression with elevation  TODAY'S TREATMENT                                                                        DATE: 11/07/2022: Therapeutic Exercise: UBE LE only full revolution reverse and forward intermittently 8 mins  Seated Rt knee hamstring curl blue band x 15 Supine AROM heel slide 5 sec hold x 5  Leg extension machine double leg up , Rt leg lowering  focus 2 x 10 c 30 sec rest into flexion stretch after each set Leg press Rt leg only 50 lbs 2 x 15 c 30 sec stretch in flexion after set Incline gastroc stretch 1 min x 2 bilateral  Step up 6 inch x 15 Rt leg with hand rail assist bilateral Standing blue band TKE 5 sec hold x 15   Manual Therapy: Seated Rt knee flexion mobilization with movement with distraction/IR.  Contract/relax/hamstring contraction techniques for flexion mobility gains.  Percussive device self applied to Rt quad during manual therapy.   Modalities: Vasopneumatic right knee 34 degrees x 10 minutes medium compression with elevation  TODAY'S TREATMENT                                                                        DATE: 11/05/2022: Therapeutic Exercise: UBE LE only full revolution reverse and forward intermittently 8 mins  Seated isometric alternating 5 sec holds for Rt knee extension and flexion x12 each in 75 deg flexion, 45 deg flexion Leg press Rt leg only 50 lbs 2 x 15 c 30 sec stretch in flexion after set Incline gastroc stretch 1 min x 3 bilateral  Step up 4 inch x 15 Rt leg with hand rail assist   Manual Therapy: Seated Rt knee flexion mobilization with movement with distraction/IR.  Contract/relax techniques for flexion mobility gains.  Percussive device self applied to Rt quad during manual therapy.   Modalities: Vasopneumatic right knee 34 degrees x 10 minutes medium compression with elevation    PATIENT EDUCATION:  Education details: rationale for interventions, HEP Person educated: Patient Education method: Consulting civil engineer, Demonstration, Verbal cues, and Handouts Education comprehension: verbalized understanding, returned demonstration, and verbal cues required   HOME EXERCISE PROGRAM: Access Code: [email or web address] URL: [email or web address] Date: 09/17/2022 Prepared by: Jamey Reas   Exercises - Ankle Alphabet in Elevation  - 2-4 x daily - 7 x weekly - 1 sets - 1 reps - Quad Setting  and Stretching  - 2-4 x daily -  7 x weekly - 5-10 sets - 10 reps - prop 5-10 minutes & quad set5 seconds hold - Supine Heel Slide with Strap  - 2-3 x daily - 7 x weekly - 2-3 sets - 10 reps - 5 seconds hold - Supine Knee Extension Strengthening  - 2-3 x daily - 7 x weekly - 2-3 sets - 10 reps - 5 seconds hold - Supine Straight Leg Raises  - 2-3 x daily - 7 x weekly - 2-3 sets - 10 reps - 5 seconds hold - Seated Knee Flexion Extension AROM   - 2-4 x daily - 7 x weekly - 2-3 sets - 10 reps - 5 seconds hold - Seated Hamstring Stretch with Strap  - 2-4 x daily - 7 x weekly - 1 sets - 3 reps - 20-30 seconds hold   ASSESSMENT:   CLINICAL IMPRESSION: Focused on transitioning HEP to longer duration stretching c cues for adjustments in home program.  Pt to benefit from continued focus on improving both extension and flexion mobility as well as quad strengthening to help improve ambulation and other functional activity.  Stiffness and firmer end feel noted in both directions at this time for Rt knee flexion/extension.   OBJECTIVE IMPAIRMENTS: Abnormal gait, decreased activity tolerance, decreased balance, decreased endurance, decreased knowledge of use of DME, decreased mobility, difficulty walking, decreased ROM, decreased strength, increased edema, increased muscle spasms, impaired flexibility, postural dysfunction, and pain.    ACTIVITY LIMITATIONS: carrying, lifting, bending, sitting, standing, squatting, sleeping, stairs, transfers, bed mobility, and locomotion level   PARTICIPATION LIMITATIONS: meal prep, cleaning, laundry, driving, community activity, occupation, and church   PERSONAL FACTORS: Profession and 1 comorbidity: see PMH  are also affecting patient's functional outcome.    REHAB POTENTIAL: Good   CLINICAL DECISION MAKING: Stable/uncomplicated   EVALUATION COMPLEXITY: Low     GOALS: Goals reviewed with patient? Yes   SHORT TERM GOALS: (target date for Short term goals 10/19/2022)     1.  Patient will demonstrate independent use of home exercise program to maintain progress from in clinic treatments.   Goal status: Met 10/15/2022   LONG TERM GOALS: (target dates for all long term goals are 10 weeks  11/23/2022 )   1. Patient will demonstrate/report pain at worst less than or equal to 2/10 to facilitate minimal limitation in daily activity secondary to pain symptoms.   Goal status: on going 10/15/2022   2. Patient will demonstrate independent use of home exercise program to facilitate ability to maintain/progress functional gains from skilled physical therapy services.   Goal status: on going 10/15/2022   3. Patient will demonstrate FOTO outcome > or = 74 % to indicate reduced disability due to condition.   Goal status: on going 10/15/2022   4.  Patient will demonstrate right LE MMT 5/5 throughout to faciltiate usual transfers, stairs, squatting at [facility] for daily life.    Goal status: on going 10/15/2022   5.  Patient PROM right knee 0* - 110* Goal status: on going 10/15/2022   6.  Patient ambulates >500' negotiates ramps, curbs & stairs single rail without device independently. Goal status: on going 10/15/2022   7.  patient verbalizes understanding of ongoing HEP.  Goal Status: on going 10/15/2022     PLAN:   PT FREQUENCY: 2-3x/wk    PT DURATION: 10 weeks   PLANNED INTERVENTIONS: Therapeutic exercises, Therapeutic activity, Neuromuscular re-education, Balance training, Gait training, Patient/Family education, Self Care, Joint mobilization, Stair training, Vestibular training, DME instructions,  Dry Needling, Electrical stimulation, Cryotherapy, Moist heat, scar mobilization, Taping, Vasopneumatic device, Ultrasound, Manual therapy, and Re-evaluation   PLAN FOR NEXT SESSION: Review longer duration stretching home program.  Continued mix of therex and manual for mobility gains, quad strengthening.   Scot Jun, PT, DPT, OCS, ATC 11/12/22  8:57 AM

## 2022-11-13 ENCOUNTER — Other Ambulatory Visit: Payer: Self-pay | Admitting: Physician Assistant

## 2022-11-14 ENCOUNTER — Encounter: Payer: Self-pay | Admitting: Rehabilitative and Restorative Service Providers"

## 2022-11-14 ENCOUNTER — Ambulatory Visit: Payer: Federal, State, Local not specified - PPO | Admitting: Rehabilitative and Restorative Service Providers"

## 2022-11-14 DIAGNOSIS — M6281 Muscle weakness (generalized): Secondary | ICD-10-CM

## 2022-11-14 DIAGNOSIS — R600 Localized edema: Secondary | ICD-10-CM

## 2022-11-14 DIAGNOSIS — R2689 Other abnormalities of gait and mobility: Secondary | ICD-10-CM

## 2022-11-14 DIAGNOSIS — M25561 Pain in right knee: Secondary | ICD-10-CM | POA: Diagnosis not present

## 2022-11-14 DIAGNOSIS — M25661 Stiffness of right knee, not elsewhere classified: Secondary | ICD-10-CM

## 2022-11-14 DIAGNOSIS — R2681 Unsteadiness on feet: Secondary | ICD-10-CM

## 2022-11-14 NOTE — Therapy (Signed)
OUTPATIENT PHYSICAL THERAPY TREATMENT NOTE / MD Note   Patient Name: Shelby Espinoza MRN: [id number] DOB:12/23/1965, 57 y.o., female Today's Date: 11/14/2022  PCP:  Dorothyann Peng, NP   REFERRING PROVIDER:  Leandrew Koyanagi, MD     END OF SESSION:   PT End of Session - 11/14/22 0807     Visit Number 17    Number of Visits 22    Date for PT Re-Evaluation 11/23/22    Authorization Type [employer]    Authorization Time Period $25 COPAY, DED MET, OOP Individual FEP BLUE FOCUS Remaining $[phone]    Progress Note Due on Visit 18    PT Start Time 0804    PT Stop Time 0854    PT Time Calculation (min) 50 min    Activity Tolerance Patient tolerated treatment well    Behavior During Therapy WFL for tasks assessed/performed              Past Medical History:  Diagnosis Date   Arthritis    GERD (gastroesophageal reflux disease)    Hemorrhoids    Hypothyroidism    Past Surgical History:  Procedure Laterality Date   APPENDECTOMY  1985   BREAST REDUCTION SURGERY Bilateral [place]   COLONOSCOPY  2022   TOTAL KNEE ARTHROPLASTY Right 08/29/2022   Procedure: RIGHT TOTAL KNEE ARTHROPLASTY;  Surgeon: Leandrew Koyanagi, MD;  Location: [facility];  Service: Orthopedics;  Laterality: Right;   TUBAL LIGATION  1998   Patient Active Problem List   Diagnosis Date Noted   Status post total right knee replacement 08/29/2022   Primary osteoarthritis of right knee 07/25/2022   Viral URI 05/17/2018   Hypothyroidism 05/02/2007    REFERRING DIAG:  [id number] (ICD-10-CM) - Status post total right knee replacement  08/29/22  THERAPY DIAG:  Acute pain of right knee  Stiffness of right knee, not elsewhere classified  Localized edema  Other abnormalities of gait and mobility  Unsteadiness on feet  Muscle weakness (generalized)  Rationale for Evaluation and Treatment Rehabilitation  PERTINENT HISTORY: OA, breast reduction sg   PRECAUTIONS:  none  SUBJECTIVE:                                                                                                                                                                                      SUBJECTIVE STATEMENT:   She indicated she was busy and active over the weekend.  She mentioned she was motivated to avoid manipulation  PAIN:  Scale: no pain upon arrival.  Tightness upon arrival.  Location: Rt knee Aggravating: standing, walking & swelling Alleviating: elevation,  ice, meds   OBJECTIVE: (objective measures completed at initial evaluation unless otherwise dated)  DIAGNOSTIC FINDINGS: 08/29/2022 X-ray Total knee arthroplasty. Prosthetic components are well seated. Expected soft tissue changes in the anterior kne   PATIENT SURVEYS:  10/15/2022 FOTO update:  61  Eval: FOTO 55% and predicted 74%   EDEMA:  Eval RLE: above knee 58.3 cm  around knee 51cm  below knee 45.1cm LLE:: above knee 53.8cm  around knee 45.1cm  below knee  39cm   POSTURE:  Eval flexed trunk  and weight shift left   PALPATION: Eval Tenderness along incision, patella and joint line.  Denies tenderness over quad, hamstring and gastroc.    LOWER EXTREMITY ROM:    ROM Right eval Rt 09/18/22 Right 09/21/22 Right 09/28/22 Right 10/04/22 Right 10/09/22 Right 10/17/2022 Right 10/31/2022 Right 11/07/2022 Right 11/14/2022  Knee flexion Supine P: 68* Seated  A: 54* P: 65* Supine A: 58 P: 65  Supine AA: 76 Supine AA: 80 deg  Supine P: 83* A: 71* Seated P: 88* Supine heel slide AROM: 91 Supine AROM heel slide 91  Supine AROM heel slide: 90  Supine AROM heel slide: 91 PROM 92  Knee extension Seated A: LAQ -55* Quad set -9* P: -2* Supine A: -5 P: -2  A: -15 (seated LAQ)    Seated LAQ AROM -12 Seated LAQ AROM: -10  Seated LAQ AROM     (Blank rows = not tested)   LOWER EXTREMITY MMT:   MMT Right eval Right 10/29/2022 Left 11/07/2022 Right 11/07/2022   Hip flexion         Hip extension          Hip abduction         Hip adduction         Hip internal rotation         Hip external rotation         Knee flexion 2/5       Knee extension 2-/5 Minimal quad activitation 2+/5 (unable to perform full range against gravity  48, 40.5 lbs 23.2, 23.2 lbs   Ankle dorsiflexion         Ankle plantarflexion         Ankle inversion         Ankle eversion          (Blank rows = not tested)     FUNCTIONAL TESTS:  Eval 18 inch chair transfer: requires use of BUEs to push up. Lt SLS: unable to perform as lifting RLE was difficult Rt SLS: unable Supine <> sit: has to use LLE to manage RLE.    GAIT: 10/15/2022:  Independent  Eval: Distance walked: 49' Assistive device utilized: Environmental consultant - 2 wheeled Level of assistance: SBA Comments: antalgic pattern with decreased RLE stance, knee flexed in stance, no knee flexion for swing, limited WB RLE due to pain & weakness.      TODAY'S TREATMENT                                                                        DATE: 11/14/2022: Therapeutic Exercise: UBE LE only full revolution reverse and forward intermittently 8 mins, seat 9  Leg extension machine double leg up , Rt  leg lowering focus 2 x 12  5 lbs, c LLLD slot 4 from back on machine 5 lbs 3 mins  Lateral step down 6 inch WB Rt leg x 15 double hand assist lightly on bar Standing blue band TKE 5 sec hold x 12  Heel prop in supine c quad set 5 sec hold x 10 (total of 2 mins in heel prop  Manual:  Seated and supine Rt knee flexion c IR/distraction, contract/relax techniques.   Modalities: Vasopneumatic right knee 34 degrees x 10 minutes medium compression with elevation  TODAY'S TREATMENT                                                                        DATE: 11/12/2022: Therapeutic Exercise: UBE LE only full revolution reverse and forward intermittently 8 mins  Leg extension machine double leg up , Rt leg lowering focus 2 x 10  5 lbs, c LLLD slot 4 from back on machine 5 lbs 3 mins   - advice for seated chair LLLD stretching at home with foot on ground.  Leg press Rt leg only 50 lbs 2 x 15 Incline gastroc stretch 1 min x 2 bilateral  Step up 6 inch x 15 Rt leg with hand rail assist bilateral Standing blue band TKE 5 sec hold x 12  Heel prop foot in chair TKE stretch (cues for home), performed in first part of vaso.   Modalities: Vasopneumatic right knee 34 degrees x 10 minutes medium compression with elevation  TODAY'S TREATMENT                                                                        DATE: 11/07/2022: Therapeutic Exercise: UBE LE only full revolution reverse and forward intermittently 8 mins  Seated Rt knee hamstring curl blue band x 15 Supine AROM heel slide 5 sec hold x 5  Leg extension machine double leg up , Rt leg lowering focus 2 x 10 c 30 sec rest into flexion stretch after each set Leg press Rt leg only 50 lbs 2 x 15 c 30 sec stretch in flexion after set Incline gastroc stretch 1 min x 2 bilateral  Step up 6 inch x 15 Rt leg with hand rail assist bilateral Standing blue band TKE 5 sec hold x 15   Manual Therapy: Seated Rt knee flexion mobilization with movement with distraction/IR.  Contract/relax/hamstring contraction techniques for flexion mobility gains.  Percussive device self applied to Rt quad during manual therapy.   Modalities: Vasopneumatic right knee 34 degrees x 10 minutes medium compression with elevation   PATIENT EDUCATION:  Education details: HEP Person educated: Patient Education method: Consulting civil engineer, Media planner, Verbal cues, and Handouts Education comprehension: verbalized understanding, returned demonstration, and verbal cues required   HOME EXERCISE PROGRAM: Access Code: [email or web address] URL: [email or web address] Date: 09/17/2022 Prepared by: Jamey Reas   Exercises - Ankle Alphabet in Elevation  - 2-4 x daily - 7 x weekly - 1 sets - 1 reps -  Quad Setting and Stretching  - 2-4 x daily - 7 x weekly - 5-10  sets - 10 reps - prop 5-10 minutes & quad set5 seconds hold - Supine Heel Slide with Strap  - 2-3 x daily - 7 x weekly - 2-3 sets - 10 reps - 5 seconds hold - Supine Knee Extension Strengthening  - 2-3 x daily - 7 x weekly - 2-3 sets - 10 reps - 5 seconds hold - Supine Straight Leg Raises  - 2-3 x daily - 7 x weekly - 2-3 sets - 10 reps - 5 seconds hold - Seated Knee Flexion Extension AROM   - 2-4 x daily - 7 x weekly - 2-3 sets - 10 reps - 5 seconds hold - Seated Hamstring Stretch with Strap  - 2-4 x daily - 7 x weekly - 1 sets - 3 reps - 20-30 seconds hold   ASSESSMENT:   CLINICAL IMPRESSION: Reassessment of Rt knee mobility showed continued restriction in end range flexion c firm end feel with minimal change in last few measurements.  Extension mobility improving some at this time but still some tightness noted in that direction as well.  Strength deficits still noted but slowly improving.  Pt returns to MD office this week.  Possible discussion about manipulation options may be warranted due to capsular tightness/mobility deficits.   OBJECTIVE IMPAIRMENTS: Abnormal gait, decreased activity tolerance, decreased balance, decreased endurance, decreased knowledge of use of DME, decreased mobility, difficulty walking, decreased ROM, decreased strength, increased edema, increased muscle spasms, impaired flexibility, postural dysfunction, and pain.    ACTIVITY LIMITATIONS: carrying, lifting, bending, sitting, standing, squatting, sleeping, stairs, transfers, bed mobility, and locomotion level   PARTICIPATION LIMITATIONS: meal prep, cleaning, laundry, driving, community activity, occupation, and church   PERSONAL FACTORS: Profession and 1 comorbidity: see PMH  are also affecting patient's functional outcome.    REHAB POTENTIAL: Good   CLINICAL DECISION MAKING: Stable/uncomplicated   EVALUATION COMPLEXITY: Low     GOALS: Goals reviewed with patient? Yes   SHORT TERM GOALS: (target date for  Short term goals 10/19/2022)    1.  Patient will demonstrate independent use of home exercise program to maintain progress from in clinic treatments.   Goal status: Met 10/15/2022   LONG TERM GOALS: (target dates for all long term goals are 10 weeks  11/23/2022 )   1. Patient will demonstrate/report pain at worst less than or equal to 2/10 to facilitate minimal limitation in daily activity secondary to pain symptoms.   Goal status: on going 11/14/2022   2. Patient will demonstrate independent use of home exercise program to facilitate ability to maintain/progress functional gains from skilled physical therapy services.   Goal status: on going 11/14/2022   3. Patient will demonstrate FOTO outcome > or = 74 % to indicate reduced disability due to condition.   Goal status:on going 11/14/2022   4.  Patient will demonstrate right LE MMT 5/5 throughout to faciltiate usual transfers, stairs, squatting at [facility] for daily life.    Goal status: on going 11/14/2022   5.  Patient PROM right knee 0* - 110* Goal status: on going 11/14/2022   6.  Patient ambulates >500' negotiates ramps, curbs & stairs single rail without device independently. Goal status: on going 11/14/2022   7.  patient verbalizes understanding of ongoing HEP.  Goal Status:on going 11/14/2022     PLAN:   PT FREQUENCY: 2-3x/wk    PT DURATION: 10 weeks   PLANNED INTERVENTIONS: Therapeutic  exercises, Therapeutic activity, Neuromuscular re-education, Balance training, Gait training, Patient/Family education, Self Care, Joint mobilization, Stair training, Vestibular training, DME instructions, Dry Needling, Electrical stimulation, Cryotherapy, Moist heat, scar mobilization, Taping, Vasopneumatic device, Ultrasound, Manual therapy, and Re-evaluation   PLAN FOR NEXT SESSION: Return to MD for follow up.  Continue to attempt to improve mobility/strength Rt knee/leg.   Scot Jun, PT, DPT, OCS, ATC 11/14/22  8:50 AM

## 2022-11-16 ENCOUNTER — Ambulatory Visit (INDEPENDENT_AMBULATORY_CARE_PROVIDER_SITE_OTHER): Payer: Federal, State, Local not specified - PPO

## 2022-11-16 ENCOUNTER — Ambulatory Visit (INDEPENDENT_AMBULATORY_CARE_PROVIDER_SITE_OTHER): Payer: Federal, State, Local not specified - PPO | Admitting: Orthopaedic Surgery

## 2022-11-16 ENCOUNTER — Encounter: Payer: Self-pay | Admitting: Orthopaedic Surgery

## 2022-11-16 DIAGNOSIS — Z96651 Presence of right artificial knee joint: Secondary | ICD-10-CM

## 2022-11-16 NOTE — Progress Notes (Signed)
Post-Op Visit Note   Patient: Shelby Espinoza           Date of Birth: 1965-12-13           MRN: [id number] Visit Date: 11/16/2022 PCP: Dorothyann Peng, NP   Assessment & Plan:  Chief Complaint:  Chief Complaint  Patient presents with   Right Knee - Follow-up    Right total knee arthroplasty 08/29/2022   Visit Diagnoses:  1. Hx of total knee replacement, right   2. Status post total right knee replacement     Plan: Patient is a pleasant 57 year old female who comes in today 3 months status post right total knee replacement 08/29/2022.  She has been in physical therapy making very slow progress as she has not been wanting to take oxycodone to help push through the pain.  Otherwise, she has been doing okay.  She is now taking muscle relaxers and ibuprofen.  Examination of her right knee reveals a small effusion.  Range of motion 0 to 90 degrees.  She is stable to valgus and varus stress.  She is neurovascular intact distally.  At this point, we have discussed continuation of physical therapy versus manipulation which we have recommended.  She would like to try 2 more weeks of physical therapy to try and push things but we would also like to go ahead and put her on the schedule for manipulation under anesthesia around the time she should be finishing physical therapy.  She will call us once she finishes therapy to let us know how she is doing.  We will cancel the manipulation if needed.   call with concerns or questions.  Follow-Up Instructions: No follow-ups on file.   Orders:  Orders Placed This Encounter  Procedures   XR Knee 1-2 Views Right   No orders of the defined types were placed in this encounter.   Imaging: XR Knee 1-2 Views Right  Result Date: 11/16/2022 Well-seated prosthesis without complication   PMFS History: Patient Active Problem List   Diagnosis Date Noted   Status post total right knee replacement 08/29/2022   Primary osteoarthritis of right knee 07/25/2022    Viral URI 05/17/2018   Hypothyroidism 05/02/2007   Past Medical History:  Diagnosis Date   Arthritis    GERD (gastroesophageal reflux disease)    Hemorrhoids    Hypothyroidism     Family History  Problem Relation Age of Onset   Alcohol abuse Father    Throat cancer Father    Heart disease Father    Hypertension Father    Breast cancer Mother    Breast cancer Maternal Aunt    Heart disease Paternal Aunt    Colon cancer Neg Hx     Past Surgical History:  Procedure Laterality Date   APPENDECTOMY  1985   BREAST REDUCTION SURGERY Bilateral [place]   COLONOSCOPY  2022   TOTAL KNEE ARTHROPLASTY Right 08/29/2022   Procedure: RIGHT TOTAL KNEE ARTHROPLASTY;  Surgeon: Leandrew Koyanagi, MD;  Location: [facility];  Service: Orthopedics;  Laterality: Right;   TUBAL LIGATION  1998   Social History   Occupational History   Not on file  Tobacco Use   Smoking status: Never   Smokeless tobacco: Never  Vaping Use   Vaping Use: Never used  Substance and Sexual Activity   Alcohol use: No   Drug use: No   Sexual activity: Yes    Birth control/protection: None

## 2022-11-19 ENCOUNTER — Ambulatory Visit: Payer: Federal, State, Local not specified - PPO | Admitting: Rehabilitative and Restorative Service Providers"

## 2022-11-19 ENCOUNTER — Encounter: Payer: Self-pay | Admitting: Rehabilitative and Restorative Service Providers"

## 2022-11-19 DIAGNOSIS — M25661 Stiffness of right knee, not elsewhere classified: Secondary | ICD-10-CM | POA: Diagnosis not present

## 2022-11-19 DIAGNOSIS — R2681 Unsteadiness on feet: Secondary | ICD-10-CM

## 2022-11-19 DIAGNOSIS — R2689 Other abnormalities of gait and mobility: Secondary | ICD-10-CM | POA: Diagnosis not present

## 2022-11-19 DIAGNOSIS — R600 Localized edema: Secondary | ICD-10-CM

## 2022-11-19 DIAGNOSIS — M25561 Pain in right knee: Secondary | ICD-10-CM | POA: Diagnosis not present

## 2022-11-19 DIAGNOSIS — M6281 Muscle weakness (generalized): Secondary | ICD-10-CM

## 2022-11-19 NOTE — Therapy (Signed)
OUTPATIENT PHYSICAL THERAPY TREATMENT NOTE /PROGRESS NOTE/RECERT   Patient Name: Shelby Espinoza MRN: [id number] DOB:1966-08-15, 57 y.o., female Today's Date: 11/19/2022  PCP:  Dorothyann Peng, NP   REFERRING PROVIDER:  Leandrew Koyanagi, MD    Progress Note Reporting Period 10/09/2022 to 11/19/2022  See note below for Objective Data and Assessment of Progress/Goals.      END OF SESSION:   PT End of Session - 11/19/22 0807     Visit Number 18    Number of Visits 33    Date for PT Re-Evaluation 01/14/23    Authorization Type [employer]    Authorization Time Period $25 COPAY, DED MET, OOP Individual FEP BLUE FOCUS Remaining $[phone]    Progress Note Due on Visit 18    PT Start Time 0803    PT Stop Time 0853    PT Time Calculation (min) 50 min    Activity Tolerance Patient tolerated treatment well    Behavior During Therapy WFL for tasks assessed/performed               Past Medical History:  Diagnosis Date   Arthritis    GERD (gastroesophageal reflux disease)    Hemorrhoids    Hypothyroidism    Past Surgical History:  Procedure Laterality Date   APPENDECTOMY  1985   BREAST REDUCTION SURGERY Bilateral [place]   COLONOSCOPY  2022   TOTAL KNEE ARTHROPLASTY Right 08/29/2022   Procedure: RIGHT TOTAL KNEE ARTHROPLASTY;  Surgeon: Leandrew Koyanagi, MD;  Location: [facility];  Service: Orthopedics;  Laterality: Right;   TUBAL LIGATION  1998   Patient Active Problem List   Diagnosis Date Noted   Status post total right knee replacement 08/29/2022   Primary osteoarthritis of right knee 07/25/2022   Viral URI 05/17/2018   Hypothyroidism 05/02/2007    REFERRING DIAG:  Z96.651 (ICD-10-CM) - Status post total right knee replacement  08/29/22  THERAPY DIAG:  Acute pain of right knee - Plan: PT plan of care cert/re-cert  Stiffness of right knee, not elsewhere classified - Plan: PT plan of care cert/re-cert  Localized edema - Plan: PT  plan of care cert/re-cert  Other abnormalities of gait and mobility - Plan: PT plan of care cert/re-cert  Unsteadiness on feet - Plan: PT plan of care cert/re-cert  Muscle weakness (generalized) - Plan: PT plan of care cert/re-cert  Rationale for Evaluation and Treatment Rehabilitation  PERTINENT HISTORY: OA, breast reduction sg   PRECAUTIONS: none  SUBJECTIVE:  SUBJECTIVE STATEMENT:   She indicated no specific pain upon arrival today.   She indicated MD recommended manipulation but she wanted to hold off and still work on it.  She indicated swelling is still noted.   PAIN:  Scale: no pain upon arrival.  Tightness upon arrival.  Location: Rt knee Aggravating: standing, walking & swelling Alleviating: elevation, ice, meds   OBJECTIVE: (objective measures completed at initial evaluation unless otherwise dated)  DIAGNOSTIC FINDINGS: 08/29/2022 X-ray Total knee arthroplasty. Prosthetic components are well seated. Expected soft tissue changes in the anterior kne   PATIENT SURVEYS:  10/15/2022 FOTO update:  61  Eval: FOTO 55% and predicted 74%   EDEMA:  Eval RLE: above knee 58.3 cm  around knee 51cm  below knee 45.1cm LLE:: above knee 53.8cm  around knee 45.1cm  below knee  39cm   POSTURE:  Eval flexed trunk  and weight shift left   PALPATION: Eval Tenderness along incision, patella and joint line.  Denies tenderness over quad, hamstring and gastroc.    LOWER EXTREMITY ROM:    ROM Right eval Rt 09/18/22 Right 09/21/22 Right 09/28/22 Right 10/04/22 Right 10/09/22 Right 10/17/2022 Right 10/31/2022 Right 11/07/2022 Right 11/14/2022  Knee flexion Supine P: 68* Seated  A: 54* P: 65* Supine A: 58 P: 65  Supine AA: 76 Supine AA: 80 deg  Supine P: 83* A: 71* Seated P: 88* Supine heel slide  AROM: 91 Supine AROM heel slide 91  Supine AROM heel slide: 90  Supine AROM heel slide: 91 PROM 92  Knee extension Seated A: LAQ -55* Quad set -9* P: -2* Supine A: -5 P: -2  A: -15 (seated LAQ)    Seated LAQ AROM -12 Seated LAQ AROM: -10  Seated LAQ AROM     (Blank rows = not tested)   LOWER EXTREMITY MMT:   MMT Right eval Right 10/29/2022 Left 11/07/2022 Right 11/07/2022 Right 11/19/2022  Hip flexion         Hip extension         Hip abduction         Hip adduction         Hip internal rotation         Hip external rotation         Knee flexion 2/5       Knee extension 2-/5 Minimal quad activitation 2+/5 (unable to perform full range against gravity  48, 40.5 lbs 23.2, 23.2 lbs 4+/5 27, 25 lbs  Ankle dorsiflexion         Ankle plantarflexion         Ankle inversion         Ankle eversion          (Blank rows = not tested)     FUNCTIONAL TESTS:  11/19/2022  Eval 18 inch chair transfer: requires use of BUEs to push up. Lt SLS: unable to perform as lifting RLE was difficult Rt SLS: unable Supine <> sit: has to use LLE to manage RLE.    GAIT: 11/19/2022:  Independent in clinic and at home  10/15/2022:  Independent  Eval: Distance walked: 37' Assistive device utilized: Environmental consultant - 2 wheeled Level of assistance: SBA Comments: antalgic pattern with decreased RLE stance, knee flexed in stance, no knee flexion for swing, limited WB RLE due to pain & weakness.      TODAY'S TREATMENT  DATE: 11/19/2022: Therapeutic Exercise: UBE LE only full revolution reverse and forward intermittently 8 mins, seat 10 Seated AAROM Rt knee flexion c Lt leg overpressure 15 seconds  Leg extension machine double leg up , Rt leg lowering focus 2 x 10  10 lbs, c LLLD slot 4 from back on machine 5 lbs 3 mins  Lateral step down 6 inch WB Rt leg x 15 double hand assist lightly on bar Standing blue band TKE 5 sec hold x 12   Heel prop in supine c quad set 5 sec hold x 10 (total of 2 mins in heel prop  Manual:  Seated and supine Rt knee flexion c IR/distraction, contract/relax techniques.   Modalities: Vasopneumatic right knee 34 degrees x 10 minutes medium compression with elevation  TODAY'S TREATMENT                                                                        DATE: 11/14/2022: Therapeutic Exercise: UBE LE only full revolution reverse and forward intermittently 8 mins, seat 9  Leg extension machine double leg up , Rt leg lowering focus 2 x 12  5 lbs, c LLLD slot 3 from back on machine 5 lbs 3 mins  Leg press double leg 100 lbs x 15, Rt leg only 50 lbs 2 x 15  Lateral step down 6 inch WB Rt leg x 15 double hand assist lightly on bar Heel prop in supine c quad set 5 sec hold x 10 (total of 2 mins in heel prop  Manual:  Seated and supine Rt knee flexion c IR/distraction, contract/relax techniques.   Modalities: Vasopneumatic right knee 34 degrees x 10 minutes medium compression with elevation  TODAY'S TREATMENT                                                                        DATE: 11/12/2022: Therapeutic Exercise: UBE LE only full revolution reverse and forward intermittently 8 mins  Leg extension machine double leg up , Rt leg lowering focus 2 x 10  5 lbs, c LLLD slot 4 from back on machine 5 lbs 3 mins  - advice for seated chair LLLD stretching at home with foot on ground.  Leg press Rt leg only 50 lbs 2 x 15 Incline gastroc stretch 1 min x 2 bilateral  Step up 6 inch x 15 Rt leg with hand rail assist bilateral Standing blue band TKE 5 sec hold x 12  Heel prop foot in chair TKE stretch (cues for home), performed in first part of vaso.   Modalities: Vasopneumatic right knee 34 degrees x 10 minutes medium compression with elevation  TODAY'S TREATMENT  DATE: 11/07/2022: Therapeutic Exercise: UBE LE only full revolution  reverse and forward intermittently 8 mins  Seated Rt knee hamstring curl blue band x 15 Supine AROM heel slide 5 sec hold x 5  Leg extension machine double leg up , Rt leg lowering focus 2 x 10 c 30 sec rest into flexion stretch after each set Leg press Rt leg only 50 lbs 2 x 15 c 30 sec stretch in flexion after set Incline gastroc stretch 1 min x 2 bilateral  Step up 6 inch x 15 Rt leg with hand rail assist bilateral Standing blue band TKE 5 sec hold x 15   Manual Therapy: Seated Rt knee flexion mobilization with movement with distraction/IR.  Contract/relax/hamstring contraction techniques for flexion mobility gains.  Percussive device self applied to Rt quad during manual therapy.   Modalities: Vasopneumatic right knee 34 degrees x 10 minutes medium compression with elevation   PATIENT EDUCATION:  11/19/2022 Education details: HEP update Person educated: Patient Education method: Consulting civil engineer, Demonstration, Verbal cues, and Handouts Education comprehension: verbalized understanding, returned demonstration, and verbal cues required   HOME EXERCISE PROGRAM: Access Code: [email or web address] URL: [email or web address] Date: 11/19/2022 Prepared by: Scot Jun  Exercises - Ankle Alphabet in Elevation  - 2-4 x daily - 7 x weekly - 1 sets - 1 reps - Supine Heel Slide with Strap  - 2-3 x daily - 7 x weekly - 2-3 sets - 10 reps - 5 seconds hold - Seated Hamstring Stretch with Strap  - 2-4 x daily - 7 x weekly - 1 sets - 3 reps - 20-30 seconds hold - Seated Knee Flexion AAROM  - 3-5 x daily - 7 x weekly - 1 sets - 5 reps - 15 hold - Seated Long Arc Quad  - 3-5 x daily - 7 x weekly - 1-2 sets - 10 reps - 2 hold - Seated Quad Set  - 3-5 x daily - 7 x weekly - 1 sets - 10 reps - 5 hold - Seated Straight Leg Heel Taps  - 1-2 x daily - 7 x weekly - 1-2 sets - 10 reps   ASSESSMENT:   CLINICAL IMPRESSION: Pt has attended 18 visits overall and 10 visits since last progress note.  Pt  has reported continued stiffness/tightness as chief complaint with reduced pain reportings.  See objective data for updated information.  Pt has continued to show slower incremental improvements in active and passive mobility related to Rt knee mobility.  Continued improvmeents have been noted in functional strength and movement patterns as noted (improved squat, transfers, stairs for example).  At this time, recommendation was made for possible manipulation but Pt desired to continued with efforts in skilled PT for a bit longer.  Continued skilled PT services to help progress towards goals.   OBJECTIVE IMPAIRMENTS: Abnormal gait, decreased activity tolerance, decreased balance, decreased endurance, decreased knowledge of use of DME, decreased mobility, difficulty walking, decreased ROM, decreased strength, increased edema, increased muscle spasms, impaired flexibility, postural dysfunction, and pain.    ACTIVITY LIMITATIONS: carrying, lifting, bending, sitting, standing, squatting, sleeping, stairs, transfers, bed mobility, and locomotion level   PARTICIPATION LIMITATIONS: meal prep, cleaning, laundry, driving, community activity, occupation, and church   PERSONAL FACTORS: Profession and 1 comorbidity: see PMH  are also affecting patient's functional outcome.    REHAB POTENTIAL: Good   CLINICAL DECISION MAKING: Stable/uncomplicated   EVALUATION COMPLEXITY: Low     GOALS: Goals reviewed with patient? Yes   SHORT TERM GOALS: (target date  for Short term goals 10/19/2022)    1.  Patient will demonstrate independent use of home exercise program to maintain progress from in clinic treatments.   Goal status: Met 10/15/2022   LONG TERM GOALS: (target dates for all long term goals are 8 weeks  01/14/2023  )   1. Patient will demonstrate/report pain at worst less than or equal to 2/10 to facilitate minimal limitation in daily activity secondary to pain symptoms.   Goal status: Revised 11/19/2022    2. Patient will demonstrate independent use of home exercise program to facilitate ability to maintain/progress functional gains from skilled physical therapy services.   Goal status:Revised 11/19/2022   3. Patient will demonstrate FOTO outcome > or = 74 % to indicate reduced disability due to condition.   Goal status:Revised 11/19/2022   4.  Patient will demonstrate right LE MMT 5/5 throughout to faciltiate usual transfers, stairs, squatting at [facility] for daily life.    Goal status: Revised 11/19/2022   5.  Patient will demonstrate Rt knee AROM 0-100 degrees to facilitate mobility for daily activity s limitation.  Goal status: Revised 11/19/2022   6.  Patient ambulates >500' negotiates ramps, curbs & stairs single rail without device independently. Goal status: Revised 11/19/2022       PLAN:   PT FREQUENCY: 2x/week    PT DURATION: 8 weeks   PLANNED INTERVENTIONS: Therapeutic exercises, Therapeutic activity, Neuromuscular re-education, Balance training, Gait training, Patient/Family education, Self Care, Joint mobilization, Stair training, Vestibular training, DME instructions, Dry Needling, Electrical stimulation, Cryotherapy, Moist heat, scar mobilization, Taping, Vasopneumatic device, Ultrasound, Manual therapy, and Re-evaluation   PLAN FOR NEXT SESSION:Review importance of quad set, strap assisted end range in LAQ c eccentric lowering focus and overall flexion mobility gains.   Scot Jun, PT, DPT, OCS, ATC 11/19/22  9:00 AM

## 2022-11-21 ENCOUNTER — Encounter: Payer: Self-pay | Admitting: Rehabilitative and Restorative Service Providers"

## 2022-11-21 ENCOUNTER — Ambulatory Visit (INDEPENDENT_AMBULATORY_CARE_PROVIDER_SITE_OTHER): Payer: Federal, State, Local not specified - PPO | Admitting: Rehabilitative and Restorative Service Providers"

## 2022-11-21 ENCOUNTER — Ambulatory Visit: Payer: Federal, State, Local not specified - PPO | Admitting: Orthopaedic Surgery

## 2022-11-21 DIAGNOSIS — M25661 Stiffness of right knee, not elsewhere classified: Secondary | ICD-10-CM

## 2022-11-21 DIAGNOSIS — R2681 Unsteadiness on feet: Secondary | ICD-10-CM

## 2022-11-21 DIAGNOSIS — M6281 Muscle weakness (generalized): Secondary | ICD-10-CM

## 2022-11-21 DIAGNOSIS — R600 Localized edema: Secondary | ICD-10-CM | POA: Diagnosis not present

## 2022-11-21 DIAGNOSIS — R2689 Other abnormalities of gait and mobility: Secondary | ICD-10-CM

## 2022-11-21 DIAGNOSIS — M25561 Pain in right knee: Secondary | ICD-10-CM

## 2022-11-21 NOTE — Therapy (Signed)
OUTPATIENT PHYSICAL THERAPY TREATMENT NOTE   Patient Name: Shelby Espinoza MRN: [id number] DOB:July 15, 1966, 57 y.o., female Today's Date: 11/21/2022  PCP:  Dorothyann Peng, NP   REFERRING PROVIDER:  Leandrew Koyanagi, MD    END OF SESSION:   PT End of Session - 11/21/22 0804     Visit Number 19    Number of Visits 33    Date for PT Re-Evaluation 01/14/23    Authorization Type [employer] Employee    Authorization Time Period $25 COPAY, DED MET, OOP Individual FEP BLUE FOCUS Remaining $[phone]    Progress Note Due on Visit 18    PT Start Time 0800    PT Stop Time 0839    PT Time Calculation (min) 39 min    Activity Tolerance Patient tolerated treatment well    Behavior During Therapy WFL for tasks assessed/performed                Past Medical History:  Diagnosis Date   Arthritis    GERD (gastroesophageal reflux disease)    Hemorrhoids    Hypothyroidism    Past Surgical History:  Procedure Laterality Date   APPENDECTOMY  1985   BREAST REDUCTION SURGERY Bilateral [place]   COLONOSCOPY  2022   TOTAL KNEE ARTHROPLASTY Right 08/29/2022   Procedure: RIGHT TOTAL KNEE ARTHROPLASTY;  Surgeon: Leandrew Koyanagi, MD;  Location: [facility];  Service: Orthopedics;  Laterality: Right;   TUBAL LIGATION  1998   Patient Active Problem List   Diagnosis Date Noted   Status post total right knee replacement 08/29/2022   Primary osteoarthritis of right knee 07/25/2022   Viral URI 05/17/2018   Hypothyroidism 05/02/2007    REFERRING DIAG:  [id number] (ICD-10-CM) - Status post total right knee replacement  08/29/22  THERAPY DIAG:  Acute pain of right knee  Stiffness of right knee, not elsewhere classified  Localized edema  Other abnormalities of gait and mobility  Unsteadiness on feet  Muscle weakness (generalized)  Rationale for Evaluation and Treatment Rehabilitation  PERTINENT HISTORY: OA, breast reduction sg   PRECAUTIONS: none  SUBJECTIVE:                                                                                                                                                                                       SUBJECTIVE STATEMENT:   Reported feeling stiffness still like usual in mornings more.  Reported muscle getting a bit more "woken up".    PAIN:  Scale: no pain on 0/10 scale Location: Rt knee Aggravating: standing, walking & swelling Alleviating: elevation, ice, meds   OBJECTIVE: (  objective measures completed at initial evaluation unless otherwise dated)  DIAGNOSTIC FINDINGS: 08/29/2022 X-ray Total knee arthroplasty. Prosthetic components are well seated. Expected soft tissue changes in the anterior kne   PATIENT SURVEYS:  10/15/2022 FOTO update:  61  Eval: FOTO 55% and predicted 74%   EDEMA:  Eval RLE: above knee 58.3 cm  around knee 51cm  below knee 45.1cm LLE:: above knee 53.8cm  around knee 45.1cm  below knee  39cm   POSTURE:  Eval flexed trunk  and weight shift left   PALPATION: Eval Tenderness along incision, patella and joint line.  Denies tenderness over quad, hamstring and gastroc.    LOWER EXTREMITY ROM:    ROM Right eval Rt 09/18/22 Right 09/21/22 Right 09/28/22 Right 10/04/22 Right 10/09/22 Right 10/17/2022 Right 10/31/2022 Right 11/07/2022 Right 11/14/2022  Knee flexion Supine P: 68* Seated  A: 54* P: 65* Supine A: 58 P: 65  Supine AA: 76 Supine AA: 80 deg  Supine P: 83* A: 71* Seated P: 88* Supine heel slide AROM: 91 Supine AROM heel slide 91  Supine AROM heel slide: 90  Supine AROM heel slide: 91 PROM 92  Knee extension Seated A: LAQ -55* Quad set -9* P: -2* Supine A: -5 P: -2  A: -15 (seated LAQ)    Seated LAQ AROM -12 Seated LAQ AROM: -10       (Blank rows = not tested)   LOWER EXTREMITY MMT:   MMT Right eval Right 10/29/2022 Left 11/07/2022 Right 11/07/2022 Right 11/19/2022  Hip flexion         Hip extension         Hip abduction         Hip adduction          Hip internal rotation         Hip external rotation         Knee flexion 2/5       Knee extension 2-/5 Minimal quad activitation 2+/5 (unable to perform full range against gravity  48, 40.5 lbs 23.2, 23.2 lbs 4+/5 27, 25 lbs  Ankle dorsiflexion         Ankle plantarflexion         Ankle inversion         Ankle eversion          (Blank rows = not tested)     FUNCTIONAL TESTS:  11/19/2022  Eval 18 inch chair transfer: requires use of BUEs to push up. Lt SLS: unable to perform as lifting RLE was difficult Rt SLS: unable Supine <> sit: has to use LLE to manage RLE.    GAIT: 11/19/2022:  Independent in clinic and at home  10/15/2022:  Independent  Eval: Distance walked: 46' Assistive device utilized: Environmental consultant - 2 wheeled Level of assistance: SBA Comments: antalgic pattern with decreased RLE stance, knee flexed in stance, no knee flexion for swing, limited WB RLE due to pain & weakness.      TODAY'S TREATMENT                                                                        DATE: 11/21/2022: Therapeutic Exercise: UBE LE only full revolution reverse and forward intermittently 8 mins, seat 9 Seated  heel prop c Lt leg over Rt leg 5 mins Seated LAQ AROM c strap assisted concentric end range, eccentric lower focus 15 Lateral step down focus WB on Rt leg c TKE blue band in stance on step x 15 bilateral hand rail assist  Leg press  Rt leg only 50 lbs 2 x 15   Manual:  Seated and supine Rt knee flexion c IR/distraction, contract/relax techniques. Sustained overpressure long duration holds.     TODAY'S TREATMENT                                                                        DATE: 11/19/2022: Therapeutic Exercise: UBE LE only full revolution reverse and forward intermittently 8 mins, seat 10 Seated AAROM Rt knee flexion c Lt leg overpressure 15 seconds  Leg extension machine double leg up , Rt leg lowering focus 2 x 10  10 lbs, c LLLD slot 4 from back on machine 5 lbs 3  mins  Lateral step down 6 inch WB Rt leg x 15 double hand assist lightly on bar Standing blue band TKE 5 sec hold x 12  Heel prop in supine c quad set 5 sec hold x 10 (total of 2 mins in heel prop  Manual:  Seated and supine Rt knee flexion c IR/distraction, contract/relax techniques.   Modalities: Vasopneumatic right knee 34 degrees x 10 minutes medium compression with elevation  TODAY'S TREATMENT                                                                        DATE: 11/14/2022: Therapeutic Exercise: UBE LE only full revolution reverse and forward intermittently 8 mins, seat 9  Leg extension machine double leg up , Rt leg lowering focus 2 x 12  5 lbs, c LLLD slot 3 from back on machine 5 lbs 3 mins  Leg press double leg 100 lbs x 15, Rt leg only 50 lbs 2 x 15  Lateral step down 6 inch WB Rt leg x 15 double hand assist lightly on bar Heel prop in supine c quad set 5 sec hold x 10 (total of 2 mins in heel prop  Manual:  Seated and supine Rt knee flexion c IR/distraction, contract/relax techniques.   Modalities: Vasopneumatic right knee 34 degrees x 10 minutes medium compression with elevation  TODAY'S TREATMENT                                                                        DATE: 11/12/2022: Therapeutic Exercise: UBE LE only full revolution reverse and forward intermittently 8 mins  Leg extension machine double leg up , Rt leg lowering focus 2 x 10  5 lbs, c LLLD slot 4 from  back on machine 5 lbs 3 mins  - advice for seated chair LLLD stretching at home with foot on ground.  Leg press Rt leg only 50 lbs 2 x 15 Incline gastroc stretch 1 min x 2 bilateral  Step up 6 inch x 15 Rt leg with hand rail assist bilateral Standing blue band TKE 5 sec hold x 12  Heel prop foot in chair TKE stretch (cues for home), performed in first part of vaso.   Modalities: Vasopneumatic right knee 34 degrees x 10 minutes medium compression with elevation  PATIENT EDUCATION:   11/19/2022 Education details: HEP update Person educated: Patient Education method: Consulting civil engineer, Demonstration, Verbal cues, and Handouts Education comprehension: verbalized understanding, returned demonstration, and verbal cues required   HOME EXERCISE PROGRAM: Access Code: [email or web address] URL: [email or web address] Date: 11/19/2022 Prepared by: Scot Jun  Exercises - Ankle Alphabet in Elevation  - 2-4 x daily - 7 x weekly - 1 sets - 1 reps - Supine Heel Slide with Strap  - 2-3 x daily - 7 x weekly - 2-3 sets - 10 reps - 5 seconds hold - Seated Hamstring Stretch with Strap  - 2-4 x daily - 7 x weekly - 1 sets - 3 reps - 20-30 seconds hold - Seated Knee Flexion AAROM  - 3-5 x daily - 7 x weekly - 1 sets - 5 reps - 15 hold - Seated Long Arc Quad  - 3-5 x daily - 7 x weekly - 1-2 sets - 10 reps - 2 hold - Seated Quad Set  - 3-5 x daily - 7 x weekly - 1 sets - 10 reps - 5 hold - Seated Straight Leg Heel Taps  - 1-2 x daily - 7 x weekly - 1-2 sets - 10 reps   ASSESSMENT:   CLINICAL IMPRESSION: Pt to continue to benefit from quad strengthening in [place] and [place] to improve functional TKE control and WB control in functional movements such as ambulation/stairs/squats.   Flexion mobility quality improving slowly but reduced end range severity of symptoms have been noted.   OBJECTIVE IMPAIRMENTS: Abnormal gait, decreased activity tolerance, decreased balance, decreased endurance, decreased knowledge of use of DME, decreased mobility, difficulty walking, decreased ROM, decreased strength, increased edema, increased muscle spasms, impaired flexibility, postural dysfunction, and pain.    ACTIVITY LIMITATIONS: carrying, lifting, bending, sitting, standing, squatting, sleeping, stairs, transfers, bed mobility, and locomotion level   PARTICIPATION LIMITATIONS: meal prep, cleaning, laundry, driving, community activity, occupation, and church   PERSONAL FACTORS: Profession and 1 comorbidity:  see PMH  are also affecting patient's functional outcome.    REHAB POTENTIAL: Good   CLINICAL DECISION MAKING: Stable/uncomplicated   EVALUATION COMPLEXITY: Low     GOALS: Goals reviewed with patient? Yes   SHORT TERM GOALS: (target date for Short term goals 10/19/2022)    1.  Patient will demonstrate independent use of home exercise program to maintain progress from in clinic treatments.   Goal status: Met 10/15/2022   LONG TERM GOALS: (target dates for all long term goals are 8 weeks  01/14/2023  )   1. Patient will demonstrate/report pain at worst less than or equal to 2/10 to facilitate minimal limitation in daily activity secondary to pain symptoms.   Goal status: Revised 11/19/2022   2. Patient will demonstrate independent use of home exercise program to facilitate ability to maintain/progress functional gains from skilled physical therapy services.   Goal status:Revised 11/19/2022   3. Patient will demonstrate FOTO outcome > or = 74 %  to indicate reduced disability due to condition.   Goal status:Revised 11/19/2022   4.  Patient will demonstrate right LE MMT 5/5 throughout to faciltiate usual transfers, stairs, squatting at [facility] for daily life.    Goal status: Revised 11/19/2022   5.  Patient will demonstrate Rt knee AROM 0-100 degrees to facilitate mobility for daily activity s limitation.  Goal status: Revised 11/19/2022   6.  Patient ambulates >500' negotiates ramps, curbs & stairs single rail without device independently. Goal status: Revised 11/19/2022       PLAN:   PT FREQUENCY: 2x/week    PT DURATION: 8 weeks   PLANNED INTERVENTIONS: Therapeutic exercises, Therapeutic activity, Neuromuscular re-education, Balance training, Gait training, Patient/Family education, Self Care, Joint mobilization, Stair training, Vestibular training, DME instructions, Dry Needling, Electrical stimulation, Cryotherapy, Moist heat, scar mobilization, Taping, Vasopneumatic device,  Ultrasound, Manual therapy, and Re-evaluation   PLAN FOR NEXT SESSION: Continue end range TKE and flexion gains.  Progressive strengthening.    Scot Jun, PT, DPT, OCS, ATC 11/21/22  8:40 AM

## 2022-11-26 ENCOUNTER — Ambulatory Visit: Payer: Federal, State, Local not specified - PPO | Admitting: Physical Therapy

## 2022-11-26 ENCOUNTER — Encounter: Payer: Self-pay | Admitting: Orthopaedic Surgery

## 2022-11-26 ENCOUNTER — Encounter: Payer: Self-pay | Admitting: Physical Therapy

## 2022-11-26 DIAGNOSIS — R2681 Unsteadiness on feet: Secondary | ICD-10-CM

## 2022-11-26 DIAGNOSIS — M25661 Stiffness of right knee, not elsewhere classified: Secondary | ICD-10-CM

## 2022-11-26 DIAGNOSIS — R600 Localized edema: Secondary | ICD-10-CM

## 2022-11-26 DIAGNOSIS — M25561 Pain in right knee: Secondary | ICD-10-CM | POA: Diagnosis not present

## 2022-11-26 DIAGNOSIS — R2689 Other abnormalities of gait and mobility: Secondary | ICD-10-CM

## 2022-11-26 DIAGNOSIS — M6281 Muscle weakness (generalized): Secondary | ICD-10-CM

## 2022-11-26 NOTE — Therapy (Signed)
OUTPATIENT PHYSICAL THERAPY TREATMENT NOTE   Patient Name: Shelby Espinoza MRN: [id number] DOB:01/03/1966, 57 y.o., female Today's Date: 11/26/2022  PCP:  Dorothyann Peng, NP   REFERRING PROVIDER:  Leandrew Koyanagi, MD    END OF SESSION:   PT End of Session - 11/26/22 0921     Visit Number 20    Number of Visits 33    Date for PT Re-Evaluation 01/14/23    Authorization Type [employer] Employee    Authorization Time Period $25 COPAY, DED MET, OOP Individual FEP BLUE FOCUS Remaining $[phone]    Progress Note Due on Visit 28    PT Start Time 0850    PT Stop Time 0930    PT Time Calculation (min) 40 min    Equipment Utilized During Treatment Gait belt    Activity Tolerance Patient tolerated treatment well    Behavior During Therapy WFL for tasks assessed/performed                Past Medical History:  Diagnosis Date   Arthritis    GERD (gastroesophageal reflux disease)    Hemorrhoids    Hypothyroidism    Past Surgical History:  Procedure Laterality Date   APPENDECTOMY  1985   BREAST REDUCTION SURGERY Bilateral [place]   COLONOSCOPY  2022   TOTAL KNEE ARTHROPLASTY Right 08/29/2022   Procedure: RIGHT TOTAL KNEE ARTHROPLASTY;  Surgeon: Leandrew Koyanagi, MD;  Location: [facility];  Service: Orthopedics;  Laterality: Right;   TUBAL LIGATION  1998   Patient Active Problem List   Diagnosis Date Noted   Status post total right knee replacement 08/29/2022   Primary osteoarthritis of right knee 07/25/2022   Viral URI 05/17/2018   Hypothyroidism 05/02/2007    REFERRING DIAG:  [id number] (ICD-10-CM) - Status post total right knee replacement  08/29/22  THERAPY DIAG:  Acute pain of right knee  Stiffness of right knee, not elsewhere classified  Localized edema  Other abnormalities of gait and mobility  Unsteadiness on feet  Muscle weakness (generalized)  Rationale for Evaluation and Treatment Rehabilitation  PERTINENT HISTORY: OA, breast  reduction sg   PRECAUTIONS: none  SUBJECTIVE:                                                                                                                                                                                      SUBJECTIVE STATEMENT:   No pain upon arrival.    PAIN:  Scale: no pain on 0/10 scale Location: Rt knee Aggravating: standing, walking & swelling Alleviating: elevation, ice, meds   OBJECTIVE: (objective measures completed at initial  evaluation unless otherwise dated)  DIAGNOSTIC FINDINGS: 08/29/2022 X-ray Total knee arthroplasty. Prosthetic components are well seated. Expected soft tissue changes in the anterior kne   PATIENT SURVEYS:  10/15/2022 FOTO update:  61  Eval: FOTO 55% and predicted 74%   EDEMA:  Eval RLE: above knee 58.3 cm  around knee 51cm  below knee 45.1cm LLE:: above knee 53.8cm  around knee 45.1cm  below knee  39cm   POSTURE:  Eval flexed trunk  and weight shift left   PALPATION: Eval Tenderness along incision, patella and joint line.  Denies tenderness over quad, hamstring and gastroc.    LOWER EXTREMITY ROM:    ROM Right eval Rt 09/18/22 Right 09/21/22 Right 09/28/22 Right 10/04/22 Right 10/09/22 Right 10/17/2022 Right 10/31/2022 Right 11/07/2022 Right 11/14/2022 Rt 11/26/22  Knee flexion Supine P: 68* Seated  A: 54* P: 65* Supine A: 58 P: 65  Supine AA: 76 Supine AA: 80 deg  Supine P: 83* A: 71* Seated P: 88* Supine heel slide AROM: 91 Supine AROM heel slide 91  Supine AROM heel slide: 90  Supine AROM heel slide: 91 PROM 92 Supine A: 92   Knee extension Seated A: LAQ -55* Quad set -9* P: -2* Supine A: -5 P: -2  A: -15 (seated LAQ)    Seated LAQ AROM -12 Seated LAQ AROM: -10        (Blank rows = not tested)   LOWER EXTREMITY MMT:   MMT Right eval Right 10/29/2022 Left 11/07/2022 Right 11/07/2022 Right 11/19/2022  Hip flexion         Hip extension         Hip abduction         Hip adduction          Hip internal rotation         Hip external rotation         Knee flexion 2/5       Knee extension 2-/5 Minimal quad activitation 2+/5 (unable to perform full range against gravity  48, 40.5 lbs 23.2, 23.2 lbs 4+/5 27, 25 lbs  Ankle dorsiflexion         Ankle plantarflexion         Ankle inversion         Ankle eversion          (Blank rows = not tested)     FUNCTIONAL TESTS:  11/19/2022  Eval 18 inch chair transfer: requires use of BUEs to push up. Lt SLS: unable to perform as lifting RLE was difficult Rt SLS: unable Supine <> sit: has to use LLE to manage RLE.    GAIT: 11/19/2022:  Independent in clinic and at home  10/15/2022:  Independent  Eval: Distance walked: 48' Assistive device utilized: Environmental consultant - 2 wheeled Level of assistance: SBA Comments: antalgic pattern with decreased RLE stance, knee flexed in stance, no knee flexion for swing, limited WB RLE due to pain & weakness.     TODAY'S TREATMENT                                                                        DATE: 11/26/2022: Therapeutic Exercise: Recumbent bike: partial revolutions x 5 minutes seat 6 Scifit bike seat at  10 and 9, full revolutions  Leg Press: 50# bil LE's 2 x 10, Rt LE only 37# 2 x 10 Calf stretch : slant board x 60 seconds Step ups 6 inch step: x 15 c single UE support TKE using blue band x 10 holding 5 sec Manual: Seated and supine Rt knee flexion c IR/distraction, contract/relax techniques.  Supine Rt knee extension with overpressure Modalities: Vasopneumatic right knee 34 degrees x 10 minutes medium compression c elevation    TODAY'S TREATMENT                                                                        DATE: 11/21/2022: Therapeutic Exercise: UBE LE only full revolution reverse and forward intermittently 8 mins, seat 9 Seated heel prop c Lt leg over Rt leg 5 mins Seated LAQ AROM c strap assisted concentric end range, eccentric lower focus 15 Lateral step down focus WB on Rt  leg c TKE blue band in stance on step x 15 bilateral hand rail assist  Leg press  Rt leg only 50 lbs 2 x 15   Manual:  Seated and supine Rt knee flexion c IR/distraction, contract/relax techniques. Sustained overpressure long duration holds.     TODAY'S TREATMENT                                                                        DATE: 11/19/2022: Therapeutic Exercise: UBE LE only full revolution reverse and forward intermittently 8 mins, seat 10 Seated AAROM Rt knee flexion c Lt leg overpressure 15 seconds  Leg extension machine double leg up , Rt leg lowering focus 2 x 10  10 lbs, c LLLD slot 4 from back on machine 5 lbs 3 mins  Lateral step down 6 inch WB Rt leg x 15 double hand assist lightly on bar Standing blue band TKE 5 sec hold x 12  Heel prop in supine c quad set 5 sec hold x 10 (total of 2 mins in heel prop  Manual:  Seated and supine Rt knee flexion c IR/distraction, contract/relax techniques.   Modalities: Vasopneumatic right knee 34 degrees x 10 minutes medium compression with elevation  TODAY'S TREATMENT                                                                        DATE: 11/14/2022: Therapeutic Exercise: UBE LE only full revolution reverse and forward intermittently 8 mins, seat 9  Leg extension machine double leg up , Rt leg lowering focus 2 x 12  5 lbs, c LLLD slot 3 from back on machine 5 lbs 3 mins  Leg press double leg 100 lbs x 15, Rt leg only 50 lbs 2 x 15  Lateral  step down 6 inch WB Rt leg x 15 double hand assist lightly on bar Heel prop in supine c quad set 5 sec hold x 10 (total of 2 mins in heel prop  Manual:  Seated and supine Rt knee flexion c IR/distraction, contract/relax techniques.   Modalities: Vasopneumatic right knee 34 degrees x 10 minutes medium compression with elevation    PATIENT EDUCATION:  11/19/2022 Education details: HEP update Person educated: Patient Education method: Consulting civil engineer, Demonstration, Verbal cues, and  Handouts Education comprehension: verbalized understanding, returned demonstration, and verbal cues required   HOME EXERCISE PROGRAM: Access Code: [email or web address] URL: [email or web address] Date: 11/19/2022 Prepared by: Scot Jun  Exercises - Ankle Alphabet in Elevation  - 2-4 x daily - 7 x weekly - 1 sets - 1 reps - Supine Heel Slide with Strap  - 2-3 x daily - 7 x weekly - 2-3 sets - 10 reps - 5 seconds hold - Seated Hamstring Stretch with Strap  - 2-4 x daily - 7 x weekly - 1 sets - 3 reps - 20-30 seconds hold - Seated Knee Flexion AAROM  - 3-5 x daily - 7 x weekly - 1 sets - 5 reps - 15 hold - Seated Long Arc Quad  - 3-5 x daily - 7 x weekly - 1-2 sets - 10 reps - 2 hold - Seated Quad Set  - 3-5 x daily - 7 x weekly - 1 sets - 10 reps - 5 hold - Seated Straight Leg Heel Taps  - 1-2 x daily - 7 x weekly - 1-2 sets - 10 reps   ASSESSMENT:   CLINICAL IMPRESSION: Pt presenting today with no pain upon arrival. Pt did experience pain with increased Rt knee flexion with a full revolution on recumbent bike. Pt's active Rt knee flexion is 92 degrees. We discussed rampting up pt's flexion exercises at home with AAROM using strap for improved flexion. Pt stated the MD is considering a manipulation and she doesn't want to have to go through it. Continue skilled PT interventions to maximize pt's function.   OBJECTIVE IMPAIRMENTS: Abnormal gait, decreased activity tolerance, decreased balance, decreased endurance, decreased knowledge of use of DME, decreased mobility, difficulty walking, decreased ROM, decreased strength, increased edema, increased muscle spasms, impaired flexibility, postural dysfunction, and pain.    ACTIVITY LIMITATIONS: carrying, lifting, bending, sitting, standing, squatting, sleeping, stairs, transfers, bed mobility, and locomotion level   PARTICIPATION LIMITATIONS: meal prep, cleaning, laundry, driving, community activity, occupation, and church   PERSONAL  FACTORS: Profession and 1 comorbidity: see PMH  are also affecting patient's functional outcome.    REHAB POTENTIAL: Good   CLINICAL DECISION MAKING: Stable/uncomplicated   EVALUATION COMPLEXITY: Low     GOALS: Goals reviewed with patient? Yes   SHORT TERM GOALS: (target date for Short term goals 10/19/2022)    1.  Patient will demonstrate independent use of home exercise program to maintain progress from in clinic treatments.   Goal status: Met 10/15/2022   LONG TERM GOALS: (target dates for all long term goals are 8 weeks  01/14/2023  )   1. Patient will demonstrate/report pain at worst less than or equal to 2/10 to facilitate minimal limitation in daily activity secondary to pain symptoms.   Goal status: Revised 11/19/2022   2. Patient will demonstrate independent use of home exercise program to facilitate ability to maintain/progress functional gains from skilled physical therapy services.   Goal status:Revised 11/19/2022   3. Patient will demonstrate FOTO outcome > or = 74 %  to indicate reduced disability due to condition.   Goal status:Revised 11/19/2022   4.  Patient will demonstrate right LE MMT 5/5 throughout to faciltiate usual transfers, stairs, squatting at [facility] for daily life.    Goal status: Revised 11/19/2022   5.  Patient will demonstrate Rt knee AROM 0-100 degrees to facilitate mobility for daily activity s limitation.  Goal status: Revised 11/26/2022   6.  Patient ambulates >500' negotiates ramps, curbs & stairs single rail without device independently. Goal status: Revised 11/19/2022       PLAN:   PT FREQUENCY: 2x/week    PT DURATION: 8 weeks   PLANNED INTERVENTIONS: Therapeutic exercises, Therapeutic activity, Neuromuscular re-education, Balance training, Gait training, Patient/Family education, Self Care, Joint mobilization, Stair training, Vestibular training, DME instructions, Dry Needling, Electrical stimulation, Cryotherapy, Moist heat, scar  mobilization, Taping, Vasopneumatic device, Ultrasound, Manual therapy, and Re-evaluation   PLAN FOR NEXT SESSION: Continue end range TKE and flexion gains.  Progressive strengthening.    Kearney Hard, PT, MPT 11/26/22 9:25 AM   11/26/22  9:25 AM

## 2022-11-28 ENCOUNTER — Ambulatory Visit: Payer: Federal, State, Local not specified - PPO | Admitting: Physical Therapy

## 2022-11-28 ENCOUNTER — Encounter: Payer: Self-pay | Admitting: Physical Therapy

## 2022-11-28 DIAGNOSIS — M25561 Pain in right knee: Secondary | ICD-10-CM | POA: Diagnosis not present

## 2022-11-28 DIAGNOSIS — R2681 Unsteadiness on feet: Secondary | ICD-10-CM

## 2022-11-28 DIAGNOSIS — R600 Localized edema: Secondary | ICD-10-CM

## 2022-11-28 DIAGNOSIS — R2689 Other abnormalities of gait and mobility: Secondary | ICD-10-CM | POA: Diagnosis not present

## 2022-11-28 DIAGNOSIS — M25661 Stiffness of right knee, not elsewhere classified: Secondary | ICD-10-CM

## 2022-11-28 DIAGNOSIS — M6281 Muscle weakness (generalized): Secondary | ICD-10-CM

## 2022-11-28 NOTE — Therapy (Signed)
OUTPATIENT PHYSICAL THERAPY TREATMENT NOTE   Patient Name: Shelby Espinoza MRN: [id number] DOB:1966/02/17, 57 y.o., female Today's Date: 11/28/2022  PCP:  Dorothyann Peng, NP   REFERRING PROVIDER:  Leandrew Koyanagi, MD    END OF SESSION:   PT End of Session - 11/28/22 0922     Visit Number 21    Number of Visits 33    Date for PT Re-Evaluation 01/14/23    Authorization Type [employer] Employee    Authorization Time Period $25 COPAY, DED MET, OOP Individual FEP BLUE FOCUS Remaining [id number]    Authorization - Visit Number 1    Authorization - Number of Visits 11   remaining visits as of 11/28/22   Progress Note Due on Visit 28    PT Start Time 0845    PT Stop Time 0931    PT Time Calculation (min) 46 min    Equipment Utilized During Treatment Gait belt    Activity Tolerance Patient tolerated treatment well    Behavior During Therapy WFL for tasks assessed/performed                 Past Medical History:  Diagnosis Date   Arthritis    GERD (gastroesophageal reflux disease)    Hemorrhoids    Hypothyroidism    Past Surgical History:  Procedure Laterality Date   [place] Bilateral [place]   COLONOSCOPY  2022   TOTAL KNEE ARTHROPLASTY Right 08/29/2022   Procedure: RIGHT TOTAL KNEE ARTHROPLASTY;  Surgeon: Leandrew Koyanagi, MD;  Location: [facility];  Service: Orthopedics;  Laterality: Right;   TUBAL LIGATION  1998   Patient Active Problem List   Diagnosis Date Noted   Status post total right knee replacement 08/29/2022   Primary osteoarthritis of right knee 07/25/2022   Viral URI 05/17/2018   Hypothyroidism 05/02/2007    REFERRING DIAG:  [id number] (ICD-10-CM) - Status post total right knee replacement  08/29/22  THERAPY DIAG:  Acute pain of right knee  Stiffness of right knee, not elsewhere classified  Localized edema  Other abnormalities of gait and mobility  Unsteadiness on feet  Muscle  weakness (generalized)  Rationale for Evaluation and Treatment Rehabilitation  PERTINENT HISTORY: OA, breast reduction sg   PRECAUTIONS: none  SUBJECTIVE:                                                                                                                                                                                      SUBJECTIVE STATEMENT:   Will not be doing the manipulation; feels from a functional standpoint  she is doing well  PAIN:  Scale: no pain on 0/10 scale Location: Rt knee Aggravating: standing, walking & swelling Alleviating: elevation, ice, meds   OBJECTIVE: (objective measures completed at initial evaluation unless otherwise dated)  DIAGNOSTIC FINDINGS: 08/29/2022 X-ray Total knee arthroplasty. Prosthetic components are well seated. Expected soft tissue changes in the anterior kne   PATIENT SURVEYS:  10/15/2022 FOTO update:  61  Eval: FOTO 55% and predicted 74%   EDEMA:  Eval RLE: above knee 58.3 cm  around knee 51cm  below knee 45.1cm LLE:: above knee 53.8cm  around knee 45.1cm  below knee  39cm   POSTURE:  Eval flexed trunk  and weight shift left   PALPATION: Eval Tenderness along incision, patella and joint line.  Denies tenderness over quad, hamstring and gastroc.    LOWER EXTREMITY ROM:    ROM Right eval Rt 09/18/22 Right 09/21/22 Right 09/28/22 Right 10/04/22 Right 10/09/22 Right 10/17/2022 Right 10/31/2022 Right 11/07/2022 Right 11/14/2022 Rt 11/26/22  Knee flexion Supine P: 68* Seated  A: 54* P: 65* Supine A: 58 P: 65  Supine AA: 76 Supine AA: 80 deg  Supine P: 83* A: 71* Seated P: 88* Supine heel slide AROM: 91 Supine AROM heel slide 91  Supine AROM heel slide: 90  Supine AROM heel slide: 91 PROM 92 Supine A: 92   Knee extension Seated A: LAQ -55* Quad set -9* P: -2* Supine A: -5 P: -2  A: -15 (seated LAQ)    Seated LAQ AROM -12 Seated LAQ AROM: -10        (Blank rows = not tested)   LOWER EXTREMITY  MMT:   MMT Right eval Right 10/29/2022 Left 11/07/2022 Right 11/07/2022 Right 11/19/2022  Hip flexion         Hip extension         Hip abduction         Hip adduction         Hip internal rotation         Hip external rotation         Knee flexion 2/5       Knee extension 2-/5 Minimal quad activitation 2+/5 (unable to perform full range against gravity  48, 40.5 lbs 23.2, 23.2 lbs 4+/5 27, 25 lbs  Ankle dorsiflexion         Ankle plantarflexion         Ankle inversion         Ankle eversion          (Blank rows = not tested)     FUNCTIONAL TESTS:  11/19/2022  Eval 18 inch chair transfer: requires use of BUEs to push up. Lt SLS: unable to perform as lifting RLE was difficult Rt SLS: unable Supine <> sit: has to use LLE to manage RLE.    GAIT: 11/19/2022:  Independent in clinic and at home  10/15/2022:  Independent  Eval: Distance walked: 77' Assistive device utilized: Environmental consultant - 2 wheeled Level of assistance: SBA Comments: antalgic pattern with decreased RLE stance, knee flexed in stance, no knee flexion for swing, limited WB RLE due to pain & weakness.     TODAY'S TREATMENT  DATE: 11/28/2022: Therapeutic Exercise: Recumbent bike: partial revolutions x 5 minutes seat 5 Leg Press bil 75# 3x10; then RLE only 43# 3x10 Slantboard calf stretch 3x30 sec Calf raises 3x10 Forward step ups onto 6" step working on full Rt knee extension 2x10; light UE support RLE on 6" step with UE support; Lt heel taps x 10 reps Tandem walk x 5 laps at counter, intermittent UE support needed  Modalities: Vasopneumatic right knee 34 degrees x 10 minutes medium compression c elevation   TODAY'S TREATMENT                                                                        DATE: 11/26/2022: Therapeutic Exercise: Recumbent bike: partial revolutions x 5 minutes seat 6 Scifit bike seat at 10 and 9, full revolutions  Leg Press:  50# bil LE's 2 x 10, Rt LE only 37# 2 x 10 Calf stretch : slant board x 60 seconds Step ups 6 inch step: x 15 c single UE support TKE using blue band x 10 holding 5 sec Manual: Seated and supine Rt knee flexion c IR/distraction, contract/relax techniques.  Supine Rt knee extension with overpressure Modalities: Vasopneumatic right knee 34 degrees x 10 minutes medium compression c elevation    TODAY'S TREATMENT                                                                        DATE: 11/21/2022: Therapeutic Exercise: UBE LE only full revolution reverse and forward intermittently 8 mins, seat 9 Seated heel prop c Lt leg over Rt leg 5 mins Seated LAQ AROM c strap assisted concentric end range, eccentric lower focus 15 Lateral step down focus WB on Rt leg c TKE blue band in stance on step x 15 bilateral hand rail assist  Leg press  Rt leg only 50 lbs 2 x 15   Manual:  Seated and supine Rt knee flexion c IR/distraction, contract/relax techniques. Sustained overpressure long duration holds.     TODAY'S TREATMENT                                                                        DATE: 11/19/2022: Therapeutic Exercise: UBE LE only full revolution reverse and forward intermittently 8 mins, seat 10 Seated AAROM Rt knee flexion c Lt leg overpressure 15 seconds  Leg extension machine double leg up , Rt leg lowering focus 2 x 10  10 lbs, c LLLD slot 4 from back on machine 5 lbs 3 mins  Lateral step down 6 inch WB Rt leg x 15 double hand assist lightly on bar Standing blue band TKE 5 sec hold x 12  Heel prop in supine c quad set 5 sec hold  x 10 (total of 2 mins in heel prop  Manual:  Seated and supine Rt knee flexion c IR/distraction, contract/relax techniques.   Modalities: Vasopneumatic right knee 34 degrees x 10 minutes medium compression with elevation   PATIENT EDUCATION:  11/19/2022 Education details: HEP update Person educated: Patient Education method: Consulting civil engineer,  Demonstration, Verbal cues, and Handouts Education comprehension: verbalized understanding, returned demonstration, and verbal cues required   HOME EXERCISE PROGRAM: Access Code: [email or web address] URL: [email or web address] Date: 11/19/2022 Prepared by: Scot Jun  Exercises - Ankle Alphabet in Elevation  - 2-4 x daily - 7 x weekly - 1 sets - 1 reps - Supine Heel Slide with Strap  - 2-3 x daily - 7 x weekly - 2-3 sets - 10 reps - 5 seconds hold - Seated Hamstring Stretch with Strap  - 2-4 x daily - 7 x weekly - 1 sets - 3 reps - 20-30 seconds hold - Seated Knee Flexion AAROM  - 3-5 x daily - 7 x weekly - 1 sets - 5 reps - 15 hold - Seated Long Arc Quad  - 3-5 x daily - 7 x weekly - 1-2 sets - 10 reps - 2 hold - Seated Quad Set  - 3-5 x daily - 7 x weekly - 1 sets - 10 reps - 5 hold - Seated Straight Leg Heel Taps  - 1-2 x daily - 7 x weekly - 1-2 sets - 10 reps   ASSESSMENT:   CLINICAL IMPRESSION: Pt tolerated session well today, and overall feels she is doing well from a functional standpoint.   Anticipate d/c next few visits.   OBJECTIVE IMPAIRMENTS: Abnormal gait, decreased activity tolerance, decreased balance, decreased endurance, decreased knowledge of use of DME, decreased mobility, difficulty walking, decreased ROM, decreased strength, increased edema, increased muscle spasms, impaired flexibility, postural dysfunction, and pain.    ACTIVITY LIMITATIONS: carrying, lifting, bending, sitting, standing, squatting, sleeping, stairs, transfers, bed mobility, and locomotion level   PARTICIPATION LIMITATIONS: meal prep, cleaning, laundry, driving, community activity, occupation, and church   PERSONAL FACTORS: Profession and 1 comorbidity: see PMH  are also affecting patient's functional outcome.    REHAB POTENTIAL: Good   CLINICAL DECISION MAKING: Stable/uncomplicated   EVALUATION COMPLEXITY: Low     GOALS: Goals reviewed with patient? Yes   SHORT TERM GOALS:  (target date for Short term goals 10/19/2022)    1.  Patient will demonstrate independent use of home exercise program to maintain progress from in clinic treatments.  Goal status: Met 10/15/2022   LONG TERM GOALS: (target dates for all long term goals are 8 weeks  01/14/2023   1. Patient will demonstrate/report pain at worst less than or equal to 2/10 to facilitate minimal limitation in daily activity secondary to pain symptoms.   Goal status: Revised 11/19/2022   2. Patient will demonstrate independent use of home exercise program to facilitate ability to maintain/progress functional gains from skilled physical therapy services.   Goal status:Revised 11/19/2022   3. Patient will demonstrate FOTO outcome > or = 74 % to indicate reduced disability due to condition.   Goal status:Revised 11/19/2022   4.  Patient will demonstrate right LE MMT 5/5 throughout to faciltiate usual transfers, stairs, squatting at [facility] for daily life.    Goal status: Revised 11/19/2022   5.  Patient will demonstrate Rt knee AROM 0-100 degrees to facilitate mobility for daily activity s limitation.  Goal status: Revised 11/26/2022   6.  Patient ambulates >500' negotiates ramps, curbs & stairs  single rail without device independently. Goal status: Revised 11/19/2022       PLAN:   PT FREQUENCY: 2x/week    PT DURATION: 8 weeks   PLANNED INTERVENTIONS: Therapeutic exercises, Therapeutic activity, Neuromuscular re-education, Balance training, Gait training, Patient/Family education, Self Care, Joint mobilization, Stair training, Vestibular training, DME instructions, Dry Needling, Electrical stimulation, Cryotherapy, Moist heat, scar mobilization, Taping, Vasopneumatic device, Ultrasound, Manual therapy, and Re-evaluation   PLAN FOR NEXT SESSION: ?possible d/c at pt's request;  Continue end range TKE and flexion gains.  Progressive strengthening.    Laureen Abrahams, PT, DPT 11/28/22 9:22 AM

## 2022-12-03 ENCOUNTER — Ambulatory Visit: Payer: Federal, State, Local not specified - PPO | Admitting: Rehabilitative and Restorative Service Providers"

## 2022-12-03 ENCOUNTER — Encounter: Payer: Self-pay | Admitting: Rehabilitative and Restorative Service Providers"

## 2022-12-03 DIAGNOSIS — R600 Localized edema: Secondary | ICD-10-CM | POA: Diagnosis not present

## 2022-12-03 DIAGNOSIS — R2681 Unsteadiness on feet: Secondary | ICD-10-CM

## 2022-12-03 DIAGNOSIS — M6281 Muscle weakness (generalized): Secondary | ICD-10-CM

## 2022-12-03 DIAGNOSIS — M25561 Pain in right knee: Secondary | ICD-10-CM | POA: Diagnosis not present

## 2022-12-03 DIAGNOSIS — M25661 Stiffness of right knee, not elsewhere classified: Secondary | ICD-10-CM

## 2022-12-03 DIAGNOSIS — R2689 Other abnormalities of gait and mobility: Secondary | ICD-10-CM

## 2022-12-03 NOTE — Therapy (Signed)
OUTPATIENT PHYSICAL THERAPY TREATMENT NOTE /DISCHARGE   Patient Name: Shelby Espinoza MRN: [id number] DOB:31-Oct-1965, 57 y.o., female Today's Date: 12/03/2022  PCP:  Dorothyann Peng, NP   REFERRING PROVIDER:  Leandrew Koyanagi, MD   PHYSICAL THERAPY DISCHARGE SUMMARY  Visits from Start of Care: 22  Current functional level related to goals / functional outcomes: See note   Remaining deficits: See note   Education / Equipment: HEP   Patient agrees to discharge. Patient goals were partially met. Patient is being discharged due to  making gains to this point and requested discharge to HEP (chose not to manipulate in surgery at this time).    END OF SESSION:   PT End of Session - 12/03/22 0832     Visit Number 22    Number of Visits 33    Date for PT Re-Evaluation 01/14/23    Authorization Type [employer] Employee    Authorization Time Period $25 COPAY, DED MET, OOP Individual FEP BLUE FOCUS Remaining [id number]    Authorization - Visit Number 2    Authorization - Number of Visits 11   remaining visits as of 11/28/22   Progress Note Due on Visit 28    PT Start Time 0837    PT Stop Time 0927    PT Time Calculation (min) 50 min    Equipment Utilized During Treatment --    Activity Tolerance Patient tolerated treatment well    Behavior During Therapy WFL for tasks assessed/performed                  Past Medical History:  Diagnosis Date   Arthritis    GERD (gastroesophageal reflux disease)    Hemorrhoids    Hypothyroidism    Past Surgical History:  Procedure Laterality Date   APPENDECTOMY  1985   BREAST REDUCTION SURGERY Bilateral [place]   COLONOSCOPY  2022   TOTAL KNEE ARTHROPLASTY Right 08/29/2022   Procedure: RIGHT TOTAL KNEE ARTHROPLASTY;  Surgeon: Leandrew Koyanagi, MD;  Location: [facility];  Service: Orthopedics;  Laterality: Right;   TUBAL LIGATION  1998   Patient Active Problem List   Diagnosis Date Noted   Status post total  right knee replacement 08/29/2022   Primary osteoarthritis of right knee 07/25/2022   Viral URI 05/17/2018   Hypothyroidism 05/02/2007    REFERRING DIAG:  [id number] (ICD-10-CM) - Status post total right knee replacement  08/29/22  THERAPY DIAG:  Acute pain of right knee  Stiffness of right knee, not elsewhere classified  Localized edema  Other abnormalities of gait and mobility  Unsteadiness on feet  Muscle weakness (generalized)  Rationale for Evaluation and Treatment Rehabilitation  PERTINENT HISTORY: OA, breast reduction sg   PRECAUTIONS: none  SUBJECTIVE:  SUBJECTIVE STATEMENT:   Pt indicated not having pain but stiffness and swelling still noted.  She mentioned   PAIN:  Scale: at worst 0/10.  Tightness at worst: 6/10 Location: Rt knee Aggravating: standing, walking & swelling Alleviating: elevation, ice, meds   OBJECTIVE: (objective measures completed at initial evaluation unless otherwise dated)  DIAGNOSTIC FINDINGS: 08/29/2022 X-ray Total knee arthroplasty. Prosthetic components are well seated. Expected soft tissue changes in the anterior kne   PATIENT SURVEYS:  12/03/2022:  FOTO 80  10/15/2022 FOTO update:  61  Eval: FOTO 55% and predicted 74%   EDEMA:  Eval RLE: above knee 58.3 cm  around knee 51cm  below knee 45.1cm LLE:: above knee 53.8cm  around knee 45.1cm  below knee  39cm   POSTURE:  Eval flexed trunk  and weight shift left   PALPATION: Eval Tenderness along incision, patella and joint line.  Denies tenderness over quad, hamstring and gastroc.    LOWER EXTREMITY ROM:    ROM Right eval Rt 09/18/22 Right 09/21/22 Right 09/28/22 Right 10/04/22 Right 10/09/22 Right 10/17/2022 Right 10/31/2022 Right 11/07/2022 Right 11/14/2022 Rt 11/26/22 Right 12/03/2022  Knee flexion  Supine P: 68* Seated  A: 54* P: 65* Supine A: 58 P: 65  Supine AA: 76 Supine AA: 80 deg  Supine P: 83* A: 71* Seated P: 88* Supine heel slide AROM: 91 Supine AROM heel slide 91  Supine AROM heel slide: 90  Supine AROM heel slide: 91 PROM 92 Supine A: 92  Supine AROM heel slide 90   Knee extension Seated A: LAQ -55* Quad set -9* P: -2* Supine A: -5 P: -2  A: -15 (seated LAQ)    Seated LAQ AROM -12 Seated LAQ AROM: -10      Seated LAQ -10 AROM  Supine  quad set AROM: 0    (Blank rows = not tested)   LOWER EXTREMITY MMT:   MMT Right eval Right 10/29/2022 Left 11/07/2022 Right 11/07/2022 Right 11/19/2022 Right 12/03/2022  Hip flexion          Hip extension          Hip abduction          Hip adduction          Hip internal rotation          Hip external rotation          Knee flexion 2/5        Knee extension 2-/5 Minimal quad activitation 2+/5 (unable to perform full range against gravity  48, 40.5 lbs 23.2, 23.2 lbs 4+/5 27, 25 lbs 5/5  29.6, 30 lbs  Ankle dorsiflexion          Ankle plantarflexion          Ankle inversion          Ankle eversion           (Blank rows = not tested)     FUNCTIONAL TESTS:  11/19/2022  Eval 18 inch chair transfer: requires use of BUEs to push up. Lt SLS: unable to perform as lifting RLE was difficult Rt SLS: unable Supine <> sit: has to use LLE to manage RLE.    GAIT: 11/19/2022:  Independent in clinic and at home  10/15/2022:  Independent  Eval: Distance walked: 16' Assistive device utilized: Environmental consultant - 2 wheeled Level of assistance: SBA Comments: antalgic pattern with decreased RLE stance, knee flexed in stance, no knee flexion for swing, limited WB RLE due to pain & weakness.  TODAY'S TREATMENT                                                                        DATE: 12/03/2022: Therapeutic Exercise: UBE UE/LE ROM seat 9 10 mins full revolutions Review of HEP with handout provided.  Verbal and visual  cues Seated Rt leg LAQ with strap TKE assist, eccentric lowering x 15 Prone quad set 5 sec hold x 5 bilateral Supine Rt AROM heel slide x 2 5 sec hold Sit to stand to sit 20 inch table height x 5  Leg press Rt leg 43 lbs 2 x 15 Step on over and down 6 inch step WB on Rt leg (limited   Modalities: Vasopneumatic right knee 34 degrees x 10 minutes medium compression c elevation  TODAY'S TREATMENT                                                                        DATE: 11/28/2022: Therapeutic Exercise: Recumbent bike: partial revolutions x 5 minutes seat 5 Leg Press bil 75# 3x10; then RLE only 43# 3x10 Slantboard calf stretch 3x30 sec Calf raises 3x10 Forward step ups onto 6" step working on full Rt knee extension 2x10; light UE support RLE on 6" step with UE support; Lt heel taps x 10 reps Tandem walk x 5 laps at counter, intermittent UE support needed  Modalities: Vasopneumatic right knee 34 degrees x 10 minutes medium compression c elevation   TODAY'S TREATMENT                                                                        DATE: 11/26/2022: Therapeutic Exercise: Recumbent bike: partial revolutions x 5 minutes seat 6 Scifit bike seat at 10 and 9, full revolutions  Leg Press: 50# bil LE's 2 x 10, Rt LE only 37# 2 x 10 Calf stretch : slant board x 60 seconds Step ups 6 inch step: x 15 c single UE support TKE using blue band x 10 holding 5 sec Manual: Seated and supine Rt knee flexion c IR/distraction, contract/relax techniques.  Supine Rt knee extension with overpressure Modalities: Vasopneumatic right knee 34 degrees x 10 minutes medium compression c elevation    TODAY'S TREATMENT                                                                        DATE: 11/21/2022: Therapeutic Exercise: UBE LE only full revolution reverse and forward intermittently 8 mins,  seat 9 Seated heel prop c Lt leg over Rt leg 5 mins Seated LAQ AROM c strap assisted concentric end range,  eccentric lower focus 15 Lateral step down focus WB on Rt leg c TKE blue band in stance on step x 15 bilateral hand rail assist  Leg press  Rt leg only 50 lbs 2 x 15   Manual:  Seated and supine Rt knee flexion c IR/distraction, contract/relax techniques. Sustained overpressure long duration holds.     PATIENT EDUCATION:  12/03/2022 Education details: HEP update/review Person educated: Patient Education method: Consulting civil engineer, Demonstration, Verbal cues, and Handouts Education comprehension: verbalized understanding, returned demonstration, and verbal cues required   HOME EXERCISE PROGRAM: Access Code: [email or web address] URL: [email or web address] Date: 12/03/2022 Prepared by: Scot Jun  Exercises - Supine Heel Slide with Strap (Mirrored)  - 2-3 x daily - 7 x weekly - 2-3 sets - 10 reps - 5 seconds hold - Seated Knee Flexion AAROM  - 3-5 x daily - 7 x weekly - 1 sets - 5 reps - 15 hold - Seated Long Arc Quad  - 3-5 x daily - 7 x weekly - 1-2 sets - 10 reps - 2 hold - Seated Quad Set  - 3-5 x daily - 7 x weekly - 1 sets - 10 reps - 5 hold - Seated Straight Leg Heel Taps  - 1-2 x daily - 7 x weekly - 1-2 sets - 10 reps - Prone Quadriceps Set  - 1-2 x daily - 7 x weekly - 1 sets - 10-15 reps - 5-10 hold - Sit to Stand  - 1-2 x daily - 7 x weekly - 2-3 sets - 10 reps   ASSESSMENT:   CLINICAL IMPRESSION: Pt has attended 22 visits overall during course of treatment with minimal pain complaints.  Tightness and swelling most evident subjective report.  FOTO score improved greatly.  See objective data for updated information.  Impairments for strength and flexion mobility most obvious remaining impairments at this time.  Detailed HEP to address those impairments.  Recommendation for manipulation was mentioned on last MD viist but Pt deferred at this time.  Due to presentation and knowledge of HEP, Pt requested transitioning to HEP at this time.  Reviewed HEP to improve over time.     OBJECTIVE IMPAIRMENTS: Abnormal gait, decreased activity tolerance, decreased balance, decreased endurance, decreased knowledge of use of DME, decreased mobility, difficulty walking, decreased ROM, decreased strength, increased edema, increased muscle spasms, impaired flexibility, postural dysfunction, and pain.    ACTIVITY LIMITATIONS: carrying, lifting, bending, sitting, standing, squatting, sleeping, stairs, transfers, bed mobility, and locomotion level   PARTICIPATION LIMITATIONS: meal prep, cleaning, laundry, driving, community activity, occupation, and church   PERSONAL FACTORS: Profession and 1 comorbidity: see PMH  are also affecting patient's functional outcome.    REHAB POTENTIAL: Good   CLINICAL DECISION MAKING: Stable/uncomplicated   EVALUATION COMPLEXITY: Low     GOALS: Goals reviewed with patient? Yes   SHORT TERM GOALS: (target date for Short term goals 10/19/2022)    1.  Patient will demonstrate independent use of home exercise program to maintain progress from in clinic treatments.  Goal status: Met 10/15/2022   LONG TERM GOALS: (target dates for all long term goals are 8 weeks  01/14/2023   1. Patient will demonstrate/report pain at worst less than or equal to 2/10 to facilitate minimal limitation in daily activity secondary to pain symptoms.   Goal status: Met 12/03/2022   2. Patient will demonstrate independent use  of home exercise program to facilitate ability to maintain/progress functional gains from skilled physical therapy services.   Goal status: Met 12/03/2022   3. Patient will demonstrate FOTO outcome > or = 74 % to indicate reduced disability due to condition.   Goal status: Met 12/03/2022   4.  Patient will demonstrate right LE MMT 5/5 throughout to faciltiate usual transfers, stairs, squatting at [facility] for daily life.    Goal status:  Mostly Met 12/03/2022   5.  Patient will demonstrate Rt knee AROM 0-100 degrees to facilitate mobility for daily  activity s limitation.  Goal status: Partially  Met 12/03/2022   6.  Patient ambulates >500' negotiates ramps, curbs & stairs single rail without device independently. Goal status:  Met 12/03/2022     PLAN:   PT FREQUENCY: 2x/week    PT DURATION: 8 weeks   PLANNED INTERVENTIONS: Therapeutic exercises, Therapeutic activity, Neuromuscular re-education, Balance training, Gait training, Patient/Family education, Self Care, Joint mobilization, Stair training, Vestibular training, DME instructions, Dry Needling, Electrical stimulation, Cryotherapy, Moist heat, scar mobilization, Taping, Vasopneumatic device, Ultrasound, Manual therapy, and Re-evaluation   PLAN FOR NEXT SESSION: Discharge per Pt request.    Scot Jun, PT, DPT, OCS, ATC 12/03/22  9:24 AM

## 2022-12-05 ENCOUNTER — Encounter: Payer: Federal, State, Local not specified - PPO | Admitting: Rehabilitative and Restorative Service Providers"

## 2022-12-09 IMAGING — DX DG CHEST 1V
1 series · 1 of 1 positions shown · non-contrast
Comparison: None.

CLINICAL DATA: Cough

EXAM:
CHEST  1 VIEW

[chest]
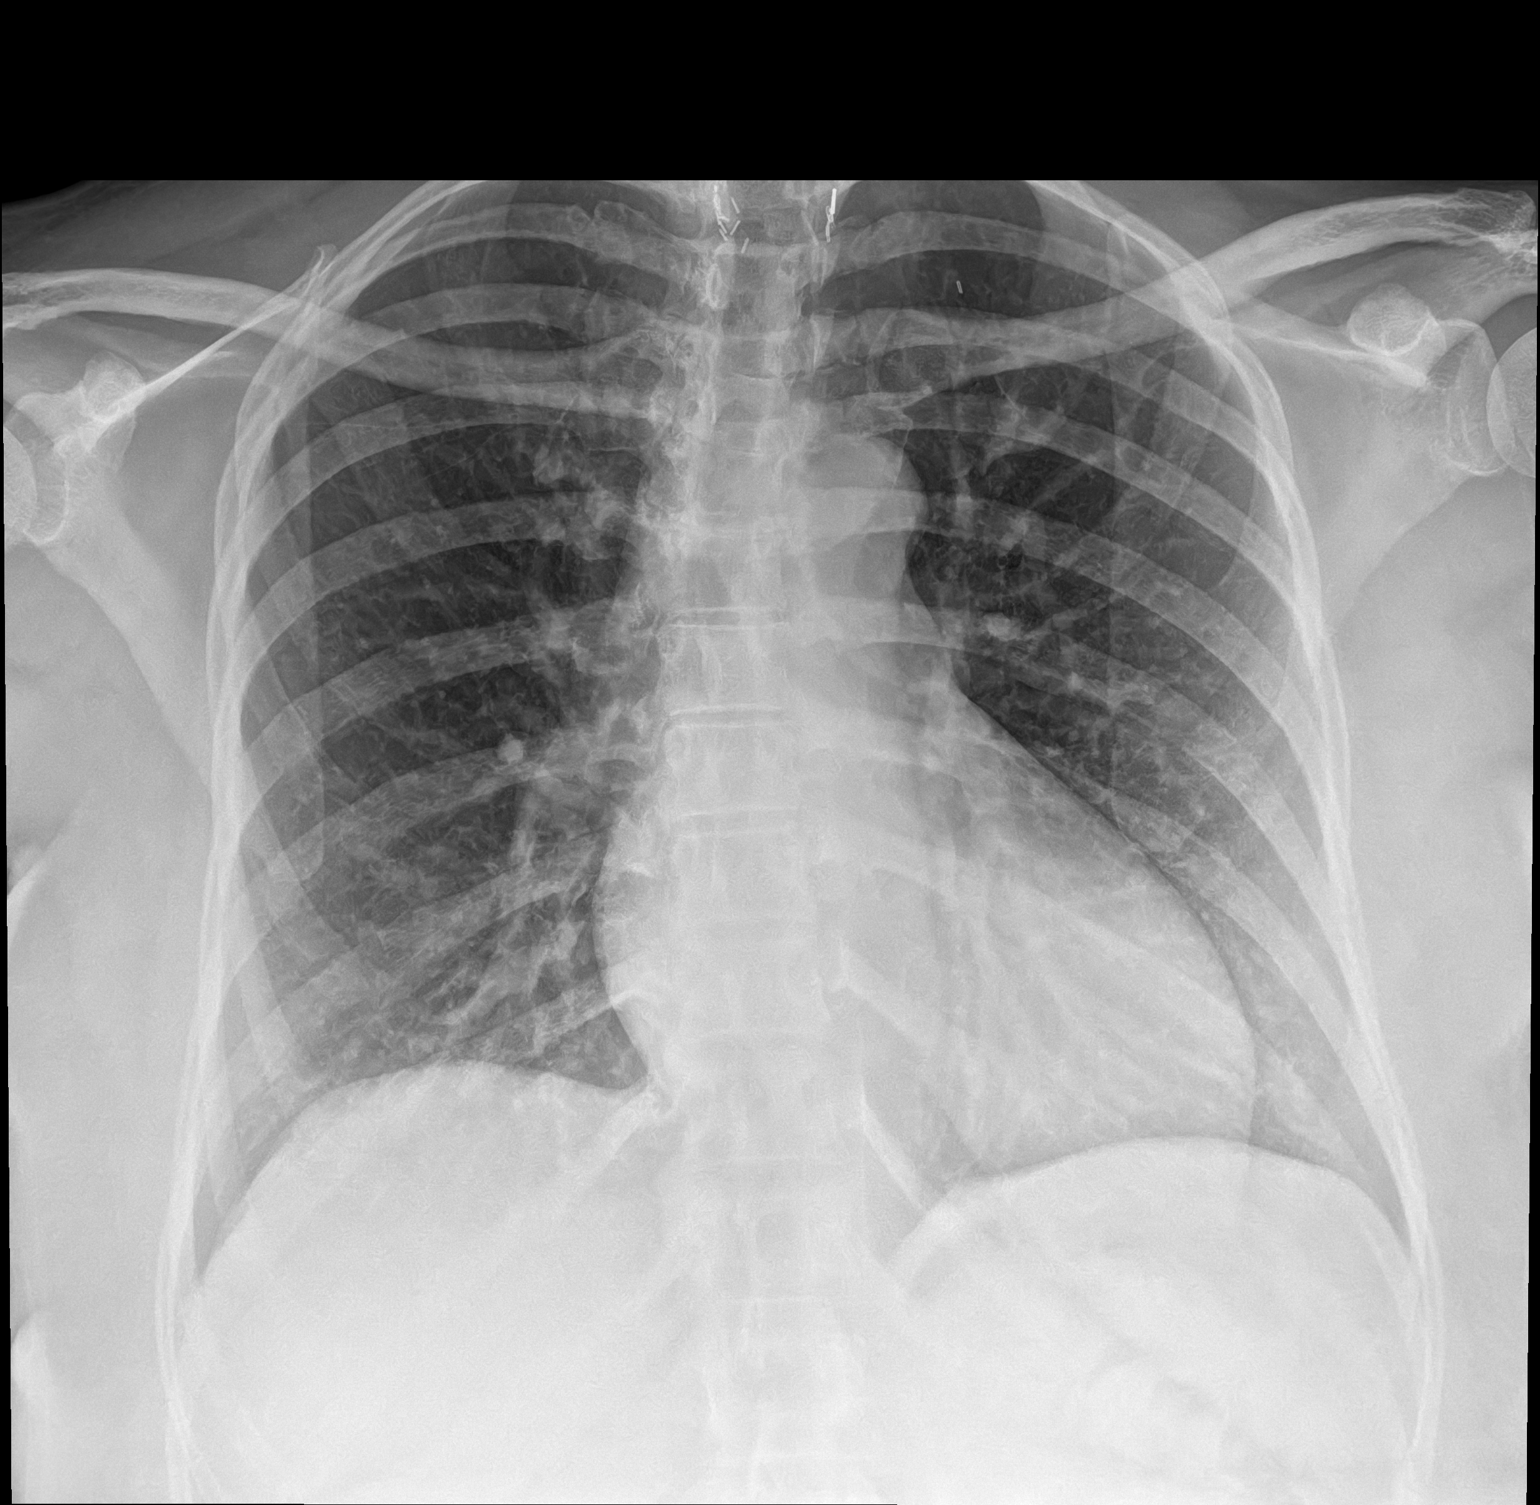

[1 of 1 positions shown; findings below may reference images not displayed]

FINDINGS: Lungs are clear.  No pleural effusion or pneumothorax.

The heart is normal in size.

Surgical clips along the bilateral neck.
IMPRESSION: No evidence of acute cardiopulmonary disease.

## 2022-12-12 ENCOUNTER — Encounter: Payer: Federal, State, Local not specified - PPO | Admitting: Rehabilitative and Restorative Service Providers"

## 2022-12-14 ENCOUNTER — Encounter: Payer: Federal, State, Local not specified - PPO | Admitting: Rehabilitative and Restorative Service Providers"

## 2022-12-17 ENCOUNTER — Encounter: Payer: Federal, State, Local not specified - PPO | Admitting: Rehabilitative and Restorative Service Providers"

## 2022-12-19 ENCOUNTER — Encounter: Payer: Federal, State, Local not specified - PPO | Admitting: Rehabilitative and Restorative Service Providers"

## 2022-12-24 ENCOUNTER — Encounter: Payer: Federal, State, Local not specified - PPO | Admitting: Rehabilitative and Restorative Service Providers"

## 2022-12-26 ENCOUNTER — Encounter: Payer: Federal, State, Local not specified - PPO | Admitting: Rehabilitative and Restorative Service Providers"

## 2023-01-04 DIAGNOSIS — Z1231 Encounter for screening mammogram for malignant neoplasm of breast: Secondary | ICD-10-CM | POA: Diagnosis not present

## 2023-01-04 LAB — HM MAMMOGRAPHY

## 2023-02-07 DIAGNOSIS — Z01419 Encounter for gynecological examination (general) (routine) without abnormal findings: Secondary | ICD-10-CM | POA: Diagnosis not present

## 2023-02-07 DIAGNOSIS — N941 Unspecified dyspareunia: Secondary | ICD-10-CM | POA: Diagnosis not present

## 2023-02-07 DIAGNOSIS — Z6833 Body mass index (BMI) 33.0-33.9, adult: Secondary | ICD-10-CM | POA: Diagnosis not present

## 2023-02-08 ENCOUNTER — Ambulatory Visit: Payer: Federal, State, Local not specified - PPO | Admitting: Urgent Care

## 2023-02-18 NOTE — Progress Notes (Unsigned)
ACUTE VISIT No chief complaint on file.  HPI: [name] is a 56 y.o. female, who is here today complaining of *** HPI  Review of Systems See other pertinent positives and negatives in HPI.  Current Outpatient Medications on File Prior to Visit  Medication Sig Dispense Refill  . cholecalciferol (VITAMIN D3) 25 MCG (1000 UNIT) tablet Take 1,000 Units by mouth daily.    Marland Kitchen EPINEPHrine 0.3 mg/0.3 mL IJ SOAJ injection Inject 0.3 mg into the muscle as needed for anaphylaxis.    Marland Kitchen ibuprofen (ADVIL) 800 MG tablet Take 1 tablet (800 mg total) by mouth every 8 (eight) hours as needed. 30 tablet 2  . levothyroxine (SYNTHROID) 50 MCG tablet TAKE 1 CAPSULE BY MOUTH EVERY DAY BEFORE BREAKFAST 90 tablet 1  . liothyronine (CYTOMEL) 25 MCG tablet TAKE 0.5 TABLETS BY MOUTH DAILY. 45 tablet 1  . methocarbamol (ROBAXIN-750) 750 MG tablet Take 1 tablet (750 mg total) by mouth 2 (two) times daily as needed for muscle spasms. 30 tablet 2  . TURMERIC PO Take 1 capsule by mouth daily.     No current facility-administered medications on file prior to visit.    Past Medical History:  Diagnosis Date  . Arthritis   . GERD (gastroesophageal reflux disease)   . Hemorrhoids   . Hypothyroidism    Allergies  Allergen Reactions  . Bee Venom Hives and Swelling    Social History   Socioeconomic History  . Marital status: Married    Spouse name: Not on file  . Number of children: Not on file  . Years of education: Not on file  . Highest education level: Master's degree (e.g., MA, MS, MEng, MEd, MSW, MBA)  Occupational History  . Not on file  Tobacco Use  . Smoking status: Never  . Smokeless tobacco: Never  Vaping Use  . Vaping Use: Never used  Substance and Sexual Activity  . Alcohol use: No  . Drug use: No  . Sexual activity: Yes    Birth control/protection: None  Other Topics Concern  . 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 Difficulty of Paying Living Expenses: Not hard at all  Food Insecurity: No Food Insecurity (01/08/2022)   Hunger Vital Sign   . Worried About Programme researcher, broadcasting/film/video in the Last Year: Never true   . Ran Out of Food in the Last Year: Never true  Transportation Needs: No Transportation Needs (01/08/2022)   PRAPARE - Transportation   . Lack of Transportation (Medical): No   . Lack of Transportation (Non-Medical): No  Physical Activity: Insufficiently Active (01/08/2022)   Exercise Vital Sign   . Days of Exercise per Week: 3 days   . Minutes of Exercise per Session: 30 min  Stress: No Stress Concern Present (01/08/2022)   [employer] of Occupational Health - Occupational Stress Questionnaire   . Feeling of Stress : Not at all  Social Connections: Socially Integrated (01/08/2022)   Social Connection and Isolation Panel [NHANES]   . Frequency of Communication with Friends and Family: More than three times a week   . Frequency of Social Gatherings with Friends  and Family: More than three times a week   . Attends Religious Services: More than 4 times per year   . Active Member of Clubs or Organizations: Yes   . Attends Banker Meetings: More than 4 times per year   . Marital Status: Married    There were no vitals filed for this visit. There is no height or weight on file to calculate BMI.  Physical Exam  ASSESSMENT AND PLAN: There are no diagnoses linked to this encounter.  No follow-ups on file.  Shelby Espinoza G. [place], MD  [facility] [place] office.  Discharge Instructions   None

## 2023-02-19 ENCOUNTER — Encounter: Payer: Self-pay | Admitting: Family Medicine

## 2023-02-19 ENCOUNTER — Ambulatory Visit: Payer: Federal, State, Local not specified - PPO | Admitting: Family Medicine

## 2023-02-19 VITALS — BP 122/80 | HR 100 | Temp 98.8°F | Resp 16 | Ht 64.5 in | Wt 168.0 lb

## 2023-02-19 DIAGNOSIS — R590 Localized enlarged lymph nodes: Secondary | ICD-10-CM | POA: Diagnosis not present

## 2023-02-19 DIAGNOSIS — J029 Acute pharyngitis, unspecified: Secondary | ICD-10-CM

## 2023-02-19 MED ORDER — AMOXICILLIN 500 MG PO TABS: 500.0000 mg | ORAL_TABLET | Freq: Two times a day (BID) | ORAL | 0 refills | Status: AC

## 2023-02-19 NOTE — Patient Instructions (Signed)
A few things to remember from today's visit:  Pharyngitis, unspecified etiology - Plan: amoxicillin (AMOXIL) 500 MG tablet  Cervical lymphadenopathy - Plan: amoxicillin (AMOXIL) 500 MG tablet  Monitor tempeture. Throat lozenges. Keep your appt with [facility].  Do not use My Chart to request refills or for acute issues that need immediate attention. If you send a my chart message, it may take a few days to be addressed, specially if I am not in the office.  Please be sure medication list is accurate. If a new problem present, please set up appointment sooner than planned today.

## 2023-03-05 ENCOUNTER — Other Ambulatory Visit: Payer: Self-pay | Admitting: Adult Health

## 2023-03-26 NOTE — Progress Notes (Signed)
Subjective:    Patient ID: Shelby Espinoza, female    DOB: Dec 30, 1965, 57 y.o.   MRN: [id number]  HPI Patient presents for yearly preventative medicine examination. She is a pleasant 57 year old female who  has a past medical history of Arthritis, GERD (gastroesophageal reflux disease), Hemorrhoids, and Hypothyroidism.  Hypothyroidism-takes Synthroid 50 mcg daily and Cytomel 12.5 mg daily.  She has been controlled for some time on this combination of medication. Lab Results  Component Value Date   TSH 2.40 03/22/2022   History of right total knee replacement - performed in 08/2022. She has been doing well post op.   All immunizations and health maintenance protocols were reviewed with the patient and needed orders were placed.   Appropriate screening laboratory values were ordered for the patient including screening of hyperlipidemia, renal function and hepatic function.  Medication reconciliation,  past medical history, social history, problem list and allergies were reviewed in detail with the patient  Goals were established with regard to weight loss, exercise, and  diet in compliance with medications. She has been going to the gym and has cut out sugar and is cutting out processed foods.   Wt Readings from Last 3 Encounters:  03/27/23 170 lb (77.1 kg)  02/19/23 168 lb (76.2 kg)  08/29/22 185 lb (83.9 kg)     She is up to date on routine colon cancer screening and GYN care.    Review of Systems  Constitutional: Negative.   HENT: Negative.    Eyes: Negative.   Respiratory: Negative.    Cardiovascular: Negative.   Gastrointestinal: Negative.   Endocrine: Negative.   Genitourinary: Negative.   Musculoskeletal: Negative.   Skin: Negative.   Allergic/Immunologic: Negative.   Neurological: Negative.   Hematological: Negative.   Psychiatric/Behavioral: Negative.     Past Medical History:  Diagnosis Date   Arthritis    GERD (gastroesophageal reflux disease)     Hemorrhoids    Hypothyroidism     Social History   Socioeconomic History   Marital status: Married    Spouse name: Not on file   Number of children: Not on file   Years of education: Not on file   Highest education level: Master's degree (e.g., MA, MS, MEng, MEd, MSW, MBA)  Occupational History   Not on file  Tobacco Use   Smoking status: Never   Smokeless tobacco: Never  Vaping Use   Vaping Use: Never used  Substance and Sexual Activity   Alcohol use: No   Drug use: No   Sexual activity: Yes    Birth control/protection: None  Other Topics Concern   Not on file  Social History Narrative   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   Three children ( all in [place] )    1 grandchild      She is active in her church    Social Determinants of Health   Financial Resource Strain: Low Risk  (01/08/2022)   Overall Financial Resource Strain (CARDIA)    Difficulty of Paying Living Expenses: Not hard at all  Food Insecurity: No Food Insecurity (01/08/2022)   Hunger Vital Sign    Worried About Running Out of Food in the Last Year: Never true    Ran Out of Food in the Last Year: Never true  Transportation Needs: No Transportation Needs (01/08/2022)   PRAPARE - Administrator, Civil Service (Medical): No    Lack of Transportation (Non-Medical): No  Physical  Activity: Insufficiently Active (01/08/2022)   Exercise Vital Sign    Days of Exercise per Week: 3 days    Minutes of Exercise per Session: 30 min  Stress: No Stress Concern Present (01/08/2022)   [employer] of Occupational Health - Occupational Stress Questionnaire    Feeling of Stress : Not at all  Social Connections: Socially Integrated (01/08/2022)   Social Connection and Isolation Panel [NHANES]    Frequency of Communication with Friends and Family: More than three times a week    Frequency of Social Gatherings with Friends and Family: More than three times a week    Attends Religious  Services: More than 4 times per year    Active Member of Clubs or Organizations: Yes    Attends Engineer, structural: More than 4 times per year    Marital Status: Married  Catering manager Violence: Not on file    Past Surgical History:  Procedure Laterality Date   APPENDECTOMY  1985   BREAST REDUCTION SURGERY Bilateral 1996   CESAREAN SECTION     1992, 1998   COLONOSCOPY  2022   TOTAL KNEE ARTHROPLASTY Right 08/29/2022   Procedure: RIGHT TOTAL KNEE ARTHROPLASTY;  Surgeon: Tarry Kos, MD;  Location: [facility] OR;  Service: Orthopedics;  Laterality: Right;   TUBAL LIGATION  1998    Family History  Problem Relation Age of Onset   Alcohol abuse Father    Throat cancer Father    Heart disease Father    Hypertension Father    Breast cancer Mother    Breast cancer Maternal Aunt    Heart disease Paternal Aunt    Colon cancer Neg Hx     Allergies  Allergen Reactions   Bee Venom Hives and Swelling    Current Outpatient Medications on File Prior to Visit  Medication Sig Dispense Refill   cholecalciferol (VITAMIN D3) 25 MCG (1000 UNIT) tablet Take 1,000 Units by mouth daily.     EPINEPHrine 0.3 mg/0.3 mL IJ SOAJ injection Inject 0.3 mg into the muscle as needed for anaphylaxis.     levothyroxine (SYNTHROID) 50 MCG tablet TAKE 1 CAPSULE BY MOUTH EVERY DAY BEFORE BREAKFAST 90 tablet 1   liothyronine (CYTOMEL) 25 MCG tablet TAKE 1/2 TABLET BY MOUTH DAILY 45 tablet 0   TURMERIC PO Take 1 capsule by mouth daily.     No current facility-administered medications on file prior to visit.    BP 130/70   Pulse 67   Temp 97.9 F (36.6 C) (Oral)   Ht 5' 3.75" (1.619 m)   Wt 170 lb (77.1 kg)   LMP 11/01/2016   SpO2 99%   BMI 29.41 kg/m       Objective:   Physical Exam Vitals and nursing note reviewed.  Constitutional:      General: She is not in acute distress.    Appearance: Normal appearance. She is not ill-appearing.  HENT:     Head: Normocephalic and atraumatic.      Right Ear: Tympanic membrane, ear canal and external ear normal. There is no impacted cerumen.     Left Ear: Tympanic membrane, ear canal and external ear normal. There is no impacted cerumen.     Nose: Nose normal. No congestion or rhinorrhea.     Mouth/Throat:     Mouth: Mucous membranes are moist.     Pharynx: Oropharynx is clear.  Eyes:     Extraocular Movements: Extraocular movements intact.     Conjunctiva/sclera: Conjunctivae normal.  Pupils: Pupils are equal, round, and reactive to light.  Neck:     Vascular: No carotid bruit.  Cardiovascular:     Rate and Rhythm: Normal rate and regular rhythm.     Pulses: Normal pulses.     Heart sounds: No murmur heard.    No friction rub. No gallop.  Pulmonary:     Effort: Pulmonary effort is normal.     Breath sounds: Normal breath sounds.  Abdominal:     General: Abdomen is flat. Bowel sounds are normal. There is no distension.     Palpations: Abdomen is soft. There is no mass.     Tenderness: There is no abdominal tenderness. There is no guarding or rebound.     Hernia: No hernia is present.  Musculoskeletal:        General: Swelling (right knee) present. Normal range of motion.     Cervical back: Normal range of motion and neck supple.  Lymphadenopathy:     Cervical: No cervical adenopathy.  Skin:    General: Skin is warm and dry.     Capillary Refill: Capillary refill takes less than 2 seconds.  Neurological:     General: No focal deficit present.     Mental Status: She is alert and oriented to person, place, and time.  Psychiatric:        Mood and Affect: Mood normal.        Behavior: Behavior normal.        Thought Content: Thought content normal.        Judgment: Judgment normal.       Assessment & Plan:  1. Routine general medical examination at a health care facility Today patient counseled on age appropriate routine health concerns for screening and prevention, each reviewed and up to date or declined.  Immunizations reviewed and up to date or declined. Labs ordered and reviewed. Risk factors for depression reviewed and negative. Hearing function and visual acuity are intact. ADLs screened and addressed as needed. Functional ability and level of safety reviewed and appropriate. Education, counseling and referrals performed based on assessed risks today. Patient provided with a copy of personalized plan for preventive services. - Continue to exercise and eat healthy  - Follow up in one year or sooner if needed  2. Hypothyroidism, unspecified type - Consider dose change of synthroid or cytomel  - CBC with Differential/Platelet; Future - Comprehensive metabolic panel; Future - Lipid panel; Future - TSH; Future  [employer]

## 2023-03-27 ENCOUNTER — Encounter: Payer: Self-pay | Admitting: Adult Health

## 2023-03-27 ENCOUNTER — Ambulatory Visit (INDEPENDENT_AMBULATORY_CARE_PROVIDER_SITE_OTHER): Payer: Federal, State, Local not specified - PPO | Admitting: Adult Health

## 2023-03-27 VITALS — BP 130/70 | HR 67 | Temp 97.9°F | Ht 63.75 in | Wt 170.0 lb

## 2023-03-27 DIAGNOSIS — Z0000 Encounter for general adult medical examination without abnormal findings: Secondary | ICD-10-CM

## 2023-03-27 DIAGNOSIS — E039 Hypothyroidism, unspecified: Secondary | ICD-10-CM | POA: Diagnosis not present

## 2023-03-27 LAB — COMPREHENSIVE METABOLIC PANEL
ALT: 11 U/L (ref 0–35)
AST: 15 U/L (ref 0–37)
Albumin: 4.2 g/dL (ref 3.5–5.2)
Alkaline Phosphatase: 55 U/L (ref 39–117)
BUN: 14 mg/dL (ref 6–23)
CO2: 31 mEq/L (ref 19–32)
Calcium: 9.2 mg/dL (ref 8.4–10.5)
Chloride: 104 mEq/L (ref 96–112)
Creatinine, Ser: 0.98 mg/dL (ref 0.40–1.20)
GFR: 63.81 mL/min (ref >60.00)
Glucose, Bld: 91 mg/dL (ref 70–99)
Potassium: 4.1 mEq/L (ref 3.5–5.1)
Sodium: 142 mEq/L (ref 135–145)
Total Bilirubin: 1.1 mg/dL (ref 0.2–1.2)
Total Protein: 7.2 g/dL (ref 6.0–8.3)

## 2023-03-27 LAB — CBC WITH DIFFERENTIAL/PLATELET
Basophils Absolute: 0 10*3/uL (ref 0.0–0.1)
Basophils Relative: 0.3 % (ref 0.0–3.0)
Eosinophils Absolute: 0.1 10*3/uL (ref 0.0–0.7)
Eosinophils Relative: 1.5 % (ref 0.0–5.0)
HCT: 39.4 % (ref 36.0–46.0)
Hemoglobin: 12.4 g/dL (ref 12.0–15.0)
Lymphocytes Relative: 52.7 % — ABNORMAL HIGH (ref 12.0–46.0)
Lymphs Abs: 2.3 10*3/uL (ref 0.7–4.0)
MCHC: 31.3 g/dL (ref 30.0–36.0)
MCV: 72.2 fl — ABNORMAL LOW (ref 78.0–100.0)
Monocytes Absolute: 0.2 10*3/uL (ref 0.1–1.0)
Monocytes Relative: 5.8 % (ref 3.0–12.0)
Neutro Abs: 1.7 10*3/uL (ref 1.4–7.7)
Neutrophils Relative %: 39.7 % — ABNORMAL LOW (ref 43.0–77.0)
Platelets: 247 10*3/uL (ref 150.0–400.0)
RBC: 5.46 Mil/uL — ABNORMAL HIGH (ref 3.87–5.11)
RDW: 19.6 % — ABNORMAL HIGH (ref 11.5–15.5)
WBC: 4.3 10*3/uL (ref 4.0–10.5)

## 2023-03-27 LAB — LIPID PANEL
Cholesterol: 140 mg/dL (ref 0–200)
HDL: 35.4 mg/dL — ABNORMAL LOW (ref >39.00)
LDL Cholesterol: 84 mg/dL (ref 0–99)
NonHDL: 104.44
Total CHOL/HDL Ratio: 4
Triglycerides: 100 mg/dL (ref 0.0–149.0)
VLDL: 20 mg/dL (ref 0.0–40.0)

## 2023-03-27 LAB — IBC + FERRITIN
Ferritin: 100.9 ng/mL (ref 10.0–291.0)
Iron: 47 ug/dL (ref 42–145)
Saturation Ratios: 15.7 % — ABNORMAL LOW (ref 20.0–50.0)
TIBC: 299.6 ug/dL (ref 250.0–450.0)
Transferrin: 214 mg/dL (ref 212.0–360.0)

## 2023-03-27 LAB — TSH: TSH: 1.91 u[IU]/mL (ref 0.35–5.50)

## 2023-03-27 NOTE — Patient Instructions (Signed)
It was great seeing you today   We will follow up with you regarding your lab work   Please let me know if you need anything   

## 2023-04-06 ENCOUNTER — Other Ambulatory Visit: Payer: Self-pay | Admitting: Adult Health

## 2023-06-06 ENCOUNTER — Other Ambulatory Visit: Payer: Self-pay | Admitting: Adult Health

## 2023-07-17 ENCOUNTER — Encounter: Payer: Self-pay | Admitting: Adult Health

## 2023-07-18 NOTE — Telephone Encounter (Signed)
Please advise 

## 2023-07-22 NOTE — Telephone Encounter (Signed)
Please advise 

## 2023-09-23 ENCOUNTER — Ambulatory Visit: Payer: Federal, State, Local not specified - PPO

## 2023-09-23 ENCOUNTER — Telehealth: Payer: Self-pay | Admitting: Adult Health

## 2023-09-23 NOTE — Telephone Encounter (Signed)
Patient is requesting Sickle Cell testing.  Patient will need a call back to let her know where she can go for this and if she needs a referral.

## 2023-09-24 NOTE — Telephone Encounter (Signed)
Tried to call pt but no answer. Please schedule pt if she calls back.

## 2023-09-24 NOTE — Telephone Encounter (Signed)
Please advise 

## 2023-09-26 ENCOUNTER — Telehealth: Payer: Self-pay

## 2023-09-26 NOTE — Telephone Encounter (Unsigned)
Copied from CRM [id number]. Topic: Clinical - Lab/Test Results >> Sep 26, 2023 10:59 AM Florestine Avers wrote: Reason for CRM: Patient is trying to get a sickle cell blood test, was trying to go to [place] location but they do not offer that test there. Patient was called on the 24th but no one left a message, is requesting a nurse.

## 2023-09-30 NOTE — Telephone Encounter (Signed)
Pt is going to [facility] and will not be back until mid feb and will check with [facility] cell [facility] there is no order for lab

## 2023-10-01 NOTE — Telephone Encounter (Signed)
 Noted.

## 2023-10-06 ENCOUNTER — Other Ambulatory Visit: Payer: Self-pay | Admitting: Adult Health

## 2023-10-17 ENCOUNTER — Ambulatory Visit: Payer: Federal, State, Local not specified - PPO | Admitting: Adult Health

## 2023-12-28 ENCOUNTER — Other Ambulatory Visit: Payer: Self-pay | Admitting: Adult Health

## 2024-01-15 ENCOUNTER — Ambulatory Visit: Admitting: Family Medicine

## 2024-02-10 DIAGNOSIS — Z1231 Encounter for screening mammogram for malignant neoplasm of breast: Secondary | ICD-10-CM | POA: Diagnosis not present

## 2024-02-10 LAB — HM MAMMOGRAPHY

## 2024-02-11 ENCOUNTER — Encounter: Payer: Self-pay | Admitting: Adult Health

## 2024-03-31 ENCOUNTER — Encounter: Payer: Self-pay | Admitting: Adult Health

## 2024-04-02 DIAGNOSIS — Z01419 Encounter for gynecological examination (general) (routine) without abnormal findings: Secondary | ICD-10-CM | POA: Diagnosis not present

## 2024-04-07 ENCOUNTER — Encounter: Admitting: Adult Health

## 2024-06-09 ENCOUNTER — Other Ambulatory Visit: Payer: Self-pay | Admitting: Adult Health

## 2024-06-17 ENCOUNTER — Ambulatory Visit: Admitting: Adult Health

## 2024-06-17 VITALS — BP 110/80 | HR 80 | Temp 97.9°F | Ht 63.0 in | Wt 176.0 lb

## 2024-06-17 DIAGNOSIS — Z23 Encounter for immunization: Secondary | ICD-10-CM

## 2024-06-17 DIAGNOSIS — E039 Hypothyroidism, unspecified: Secondary | ICD-10-CM

## 2024-06-17 DIAGNOSIS — Z0000 Encounter for general adult medical examination without abnormal findings: Secondary | ICD-10-CM | POA: Diagnosis not present

## 2024-06-17 LAB — CBC WITH DIFFERENTIAL/PLATELET
Basophils Absolute: 0 K/uL (ref 0.0–0.1)
Basophils Relative: 0.4 % (ref 0.0–3.0)
Eosinophils Absolute: 0 K/uL (ref 0.0–0.7)
Eosinophils Relative: 0.9 % (ref 0.0–5.0)
HCT: 40.2 % (ref 36.0–46.0)
Hemoglobin: 12.6 g/dL (ref 12.0–15.0)
Lymphocytes Relative: 53.1 % — ABNORMAL HIGH (ref 12.0–46.0)
Lymphs Abs: 2.1 K/uL (ref 0.7–4.0)
MCHC: 31.5 g/dL (ref 30.0–36.0)
MCV: 71.4 fl — ABNORMAL LOW (ref 78.0–100.0)
Monocytes Absolute: 0.2 K/uL (ref 0.1–1.0)
Monocytes Relative: 5.5 % (ref 3.0–12.0)
Neutro Abs: 1.6 K/uL (ref 1.4–7.7)
Neutrophils Relative %: 40.1 % — ABNORMAL LOW (ref 43.0–77.0)
Platelets: 218 K/uL (ref 150.0–400.0)
RBC: 5.63 Mil/uL — ABNORMAL HIGH (ref 3.87–5.11)
RDW: 19.2 % — ABNORMAL HIGH (ref 11.5–15.5)
WBC: 4 K/uL (ref 4.0–10.5)

## 2024-06-17 LAB — LIPID PANEL
Cholesterol: 135 mg/dL (ref 0–200)
HDL: 40.4 mg/dL (ref >39.00)
LDL Cholesterol: 80 mg/dL (ref 0–99)
NonHDL: 94.29
Total CHOL/HDL Ratio: 3
Triglycerides: 71 mg/dL (ref 0.0–149.0)
VLDL: 14.2 mg/dL (ref 0.0–40.0)

## 2024-06-17 LAB — TSH: TSH: 1.06 u[IU]/mL (ref 0.35–5.50)

## 2024-06-17 LAB — COMPREHENSIVE METABOLIC PANEL WITH GFR
ALT: 15 U/L (ref 0–35)
AST: 19 U/L (ref 0–37)
Albumin: 4.3 g/dL (ref 3.5–5.2)
Alkaline Phosphatase: 55 U/L (ref 39–117)
BUN: 15 mg/dL (ref 6–23)
CO2: 26 meq/L (ref 19–32)
Calcium: 9.1 mg/dL (ref 8.4–10.5)
Chloride: 105 meq/L (ref 96–112)
Creatinine, Ser: 0.99 mg/dL (ref 0.40–1.20)
GFR: 62.5 mL/min (ref >60.00)
Glucose, Bld: 72 mg/dL (ref 70–99)
Potassium: 4.1 meq/L (ref 3.5–5.1)
Sodium: 142 meq/L (ref 135–145)
Total Bilirubin: 1 mg/dL (ref 0.2–1.2)
Total Protein: 7.1 g/dL (ref 6.0–8.3)

## 2024-06-17 NOTE — Patient Instructions (Signed)
 It was great seeing you today   We will follow up with you regarding your lab work   Please let me know if you need anything

## 2024-06-17 NOTE — Progress Notes (Signed)
 Subjective:    Patient ID: Shelby Espinoza Ill, female    DOB: 11-29-1965, 58 y.o.   MRN: [id number]  HPI Patient presents for yearly preventative medicine examination. She is a pleasant 58 year old female who  has a past medical history of Arthritis, GERD (gastroesophageal reflux disease), Hemorrhoids, and Hypothyroidism.  Hypothyroidism-takes Synthroid  50 mcg daily and Cytomel  12.5 mg daily.  She has been controlled for some time on this combination of medication. Lab Results  Component Value Date   TSH 1.91 03/27/2023    All immunizations and health maintenance protocols were reviewed with the patient and needed orders were placed. She will get tdap today but refuses flu and pneumonia vaccinations.   Appropriate screening laboratory values were ordered for the patient including screening of hyperlipidemia, renal function and hepatic function.  Medication reconciliation,  past medical history, social history, problem list and allergies were reviewed in detail with the patient  Goals were established with regard to weight loss, exercise, and  diet in compliance with medications She has not been exercising on a regular basis or eating the healthiest. She is commuting to the [employer] office in [place] now.  Wt Readings from Last 3 Encounters:  06/17/24 176 lb (79.8 kg)  03/27/23 170 lb (77.1 kg)  02/19/23 168 lb (76.2 kg)   She is up to date on routine colon cancer screening and mammograms.   Review of Systems  Constitutional: Negative.   HENT: Negative.    Eyes: Negative.   Respiratory: Negative.    Cardiovascular: Negative.   Gastrointestinal: Negative.   Endocrine: Negative.   Genitourinary: Negative.   Musculoskeletal: Negative.   Skin: Negative.   Allergic/Immunologic: Negative.   Neurological: Negative.   Hematological: Negative.   Psychiatric/Behavioral: Negative.     Past Medical History:  Diagnosis Date   Arthritis    GERD (gastroesophageal reflux disease)     Hemorrhoids    Hypothyroidism     Social History   Socioeconomic History   Marital status: Married    Spouse name: Not on file   Number of children: Not on file   Years of education: Not on file   Highest education level: Master's degree (e.g., MA, MS, MEng, MEd, MSW, MBA)  Occupational History   Not on file  Tobacco Use   Smoking status: Never   Smokeless tobacco: Never  Vaping Use   Vaping status: Never Used  Substance and Sexual Activity   Alcohol use: No   Drug use: No   Sexual activity: Yes    Birth control/protection: None  Other Topics Concern   Not on file  Social History Narrative   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   Three children ( all in [place]  )    1 grandchild      She is active in her church    Social Drivers of Corporate investment banker Strain: Low Risk  (06/16/2024)   Overall Financial Resource Strain (CARDIA)    Difficulty of Paying Living Expenses: Not hard at all  Food Insecurity: No Food Insecurity (06/16/2024)   Hunger Vital Sign    Worried About Running Out of Food in the Last Year: Never true    Ran Out of Food in the Last Year: Never true  Transportation Needs: No Transportation Needs (06/16/2024)   PRAPARE - Administrator, Civil Service (Medical): No    Lack of Transportation (Non-Medical): No  Physical Activity: Sufficiently Active (06/16/2024)   Exercise  Vital Sign    Days of Exercise per Week: 4 days    Minutes of Exercise per Session: 50 min  Stress: No Stress Concern Present (06/16/2024)   [employer] of Occupational Health - Occupational Stress Questionnaire    Feeling of Stress: Not at all  Social Connections: Socially Integrated (06/16/2024)   Social Connection and Isolation Panel    Frequency of Communication with Friends and Family: More than three times a week    Frequency of Social Gatherings with Friends and Family: More than three times a week    Attends Religious Services:  More than 4 times per year    Active Member of Clubs or Organizations: Yes    Attends Engineer, structural: More than 4 times per year    Marital Status: Married  Catering manager Violence: Not on file    Past Surgical History:  Procedure Laterality Date   APPENDECTOMY  1985   BREAST REDUCTION SURGERY Bilateral 1996   CESAREAN SECTION     1992, 1998   COLONOSCOPY  2022   TOTAL KNEE ARTHROPLASTY Right 08/29/2022   Procedure: RIGHT TOTAL KNEE ARTHROPLASTY;  Surgeon: Jerri Kay HERO, MD;  Location: [facility] OR;  Service: Orthopedics;  Laterality: Right;   TUBAL LIGATION  1998    Family History  Problem Relation Age of Onset   Alcohol abuse Father    Throat cancer Father    Heart disease Father    Hypertension Father    Breast cancer Mother    Breast cancer Maternal Aunt    Heart disease Paternal Aunt    Colon cancer Neg Hx     Allergies  Allergen Reactions   Bee Venom Hives and Swelling    Current Outpatient Medications on File Prior to Visit  Medication Sig Dispense Refill   cholecalciferol (VITAMIN D3) 25 MCG (1000 UNIT) tablet Take 1,000 Units by mouth daily.     EPINEPHrine 0.3 mg/0.3 mL IJ SOAJ injection Inject 0.3 mg into the muscle as needed for anaphylaxis.     levothyroxine  (SYNTHROID ) 50 MCG tablet TAKE 1 CAPSULE BY MOUTH EVERY DAY BEFORE BREAKFAST 90 tablet 0   liothyronine  (CYTOMEL ) 25 MCG tablet TAKE 1/2 TABLET BY MOUTH DAILY 45 tablet 1   TURMERIC PO Take 1 capsule by mouth daily.     No current facility-administered medications on file prior to visit.    BP 110/80   Pulse 80   Temp 97.9 F (36.6 C) (Oral)   Ht 5' 3 (1.6 m)   Wt 176 lb (79.8 kg)   LMP 11/01/2016   SpO2 97%   BMI 31.18 kg/m       Objective:   Physical Exam Vitals and nursing note reviewed.  Constitutional:      General: She is not in acute distress.    Appearance: Normal appearance. She is not ill-appearing.  HENT:     Head: Normocephalic and atraumatic.     Right Ear:  Tympanic membrane, ear canal and external ear normal. There is no impacted cerumen.     Left Ear: Tympanic membrane, ear canal and external ear normal. There is no impacted cerumen.     Nose: Nose normal. No congestion or rhinorrhea.     Mouth/Throat:     Mouth: Mucous membranes are moist.     Pharynx: Oropharynx is clear.  Eyes:     Extraocular Movements: Extraocular movements intact.     Conjunctiva/sclera: Conjunctivae normal.     Pupils: Pupils are equal, round, and  reactive to light.  Neck:     Vascular: No carotid bruit.  Cardiovascular:     Rate and Rhythm: Normal rate and regular rhythm.     Pulses: Normal pulses.     Heart sounds: No murmur heard.    No friction rub. No gallop.  Pulmonary:     Effort: Pulmonary effort is normal.     Breath sounds: Normal breath sounds.  Abdominal:     General: Abdomen is flat. Bowel sounds are normal. There is no distension.     Palpations: Abdomen is soft. There is no mass.     Tenderness: There is no abdominal tenderness. There is no guarding or rebound.     Hernia: No hernia is present.  Musculoskeletal:        General: Normal range of motion.     Cervical back: Normal range of motion and neck supple.  Lymphadenopathy:     Cervical: No cervical adenopathy.  Skin:    General: Skin is warm and dry.     Capillary Refill: Capillary refill takes less than 2 seconds.  Neurological:     General: No focal deficit present.     Mental Status: She is alert and oriented to person, place, and time.  Psychiatric:        Mood and Affect: Mood normal.        Behavior: Behavior normal.        Thought Content: Thought content normal.        Judgment: Judgment normal.       Assessment & Plan:  1. Routine general medical examination at a health care facility (Primary) Today patient counseled on age appropriate routine health concerns for screening and prevention, each reviewed and up to date or declined. Immunizations reviewed and up to date or  declined. Labs ordered and reviewed. Risk factors for depression reviewed and negative. Hearing function and visual acuity are intact. ADLs screened and addressed as needed. Functional ability and level of safety reviewed and appropriate. Education, counseling and referrals performed based on assessed risks today. Patient provided with a copy of personalized plan for preventive services. - Encouraged weight loss through diet and exercise - Follow up in one year or sooner if needed   2. Hypothyroidism, unspecified type - Consider dose change of medications.  - CBC with Differential/Platelet; Future - Comprehensive metabolic panel with GFR; Future - Lipid panel; Future - TSH; Future  3. Need for tetanus booster  - Tdap vaccine greater than or equal to 7yo IM  Jimya Ciani, NP

## 2024-06-18 ENCOUNTER — Other Ambulatory Visit: Payer: Self-pay | Admitting: Adult Health

## 2024-06-18 ENCOUNTER — Ambulatory Visit: Payer: Self-pay | Admitting: Adult Health

## 2024-06-18 MED ORDER — LEVOTHYROXINE SODIUM 50 MCG PO TABS: ORAL_TABLET | ORAL | 3 refills | Status: AC

## 2024-10-02 ENCOUNTER — Other Ambulatory Visit: Payer: Self-pay

## 2024-10-02 ENCOUNTER — Encounter: Payer: Self-pay | Admitting: Adult Health

## 2024-10-02 ENCOUNTER — Encounter (HOSPITAL_BASED_OUTPATIENT_CLINIC_OR_DEPARTMENT_OTHER): Payer: Self-pay | Admitting: Emergency Medicine

## 2024-10-02 DIAGNOSIS — R079 Chest pain, unspecified: Secondary | ICD-10-CM | POA: Diagnosis present

## 2024-10-02 DIAGNOSIS — R0602 Shortness of breath: Secondary | ICD-10-CM | POA: Diagnosis not present

## 2024-10-02 DIAGNOSIS — Z5321 Procedure and treatment not carried out due to patient leaving prior to being seen by health care provider: Secondary | ICD-10-CM | POA: Insufficient documentation

## 2024-10-02 NOTE — ED Triage Notes (Signed)
 Patient c/o chest pain that started last night.  Patient reports pain as generalized. Patient also endorses sob.

## 2024-10-03 ENCOUNTER — Telehealth

## 2024-10-03 ENCOUNTER — Encounter: Payer: Self-pay | Admitting: Physician Assistant

## 2024-10-03 ENCOUNTER — Emergency Department (HOSPITAL_BASED_OUTPATIENT_CLINIC_OR_DEPARTMENT_OTHER): Admitting: Radiology

## 2024-10-03 ENCOUNTER — Emergency Department (HOSPITAL_BASED_OUTPATIENT_CLINIC_OR_DEPARTMENT_OTHER)
Admission: EM | Admit: 2024-10-03 | Discharge: 2024-10-03 | Discharge status: Against Medical Advice | Attending: Emergency Medicine | Admitting: Emergency Medicine

## 2024-10-03 LAB — BASIC METABOLIC PANEL WITH GFR
Anion gap: 11 (ref 5–15)
BUN: 14 mg/dL (ref 6–20)
CO2: 29 mmol/L (ref 22–32)
Calcium: 9.4 mg/dL (ref 8.9–10.3)
Chloride: 104 mmol/L (ref 98–111)
Creatinine, Ser: 0.86 mg/dL (ref 0.44–1.00)
GFR, Estimated: >60 mL/min
Glucose, Bld: 111 mg/dL — ABNORMAL HIGH (ref 70–99)
Potassium: 3.7 mmol/L (ref 3.5–5.1)
Sodium: 143 mmol/L (ref 135–145)

## 2024-10-03 LAB — CBC
HCT: 40.2 % (ref 36.0–46.0)
Hemoglobin: 12.9 g/dL (ref 12.0–15.0)
MCH: 22.7 pg — ABNORMAL LOW (ref 26.0–34.0)
MCHC: 32.1 g/dL (ref 30.0–36.0)
MCV: 70.7 fL — ABNORMAL LOW (ref 80.0–100.0)
Platelets: 233 K/uL (ref 150–400)
RBC: 5.69 MIL/uL — ABNORMAL HIGH (ref 3.87–5.11)
RDW: 19.1 % — ABNORMAL HIGH (ref 11.5–15.5)
WBC: 5.4 K/uL (ref 4.0–10.5)
nRBC: 0 % (ref 0.0–0.2)

## 2024-10-03 LAB — TROPONIN T, HIGH SENSITIVITY: Troponin T High Sensitivity: <15 ng/L (ref 0–19)

## 2024-10-03 NOTE — ED Notes (Signed)
 Blu top sent to lab if needed

## 2024-10-03 NOTE — ED Notes (Signed)
Pt left per registration staff 

## 2024-10-06 NOTE — Telephone Encounter (Signed)
"  FYI/Please advise.  "

## 2024-10-06 NOTE — Telephone Encounter (Signed)
 FYI
